# Patient Record
Sex: Male | Born: 1960
Health system: Southern US, Community
[De-identification: ages and names within clinical notes are randomized; demographics above are authoritative.]

## PROBLEM LIST (undated history)

## (undated) DIAGNOSIS — I10 Essential (primary) hypertension: Secondary | ICD-10-CM

## (undated) DIAGNOSIS — N200 Calculus of kidney: Secondary | ICD-10-CM

## (undated) DIAGNOSIS — N289 Disorder of kidney and ureter, unspecified: Secondary | ICD-10-CM

## (undated) DIAGNOSIS — E119 Type 2 diabetes mellitus without complications: Secondary | ICD-10-CM

## (undated) DIAGNOSIS — J449 Chronic obstructive pulmonary disease, unspecified: Secondary | ICD-10-CM

## (undated) DIAGNOSIS — K594 Anal spasm: Secondary | ICD-10-CM

## (undated) DIAGNOSIS — E785 Hyperlipidemia, unspecified: Secondary | ICD-10-CM

## (undated) HISTORY — DX: Essential (primary) hypertension: I10

## (undated) HISTORY — DX: Hyperlipidemia, unspecified: E78.5

## (undated) HISTORY — DX: Anal spasm: K59.4

## (undated) HISTORY — DX: Calculus of kidney: N20.0

## (undated) HISTORY — PX: SPINE SURGERY: SHX786

---

## 2000-12-17 ENCOUNTER — Encounter: Payer: Self-pay | Admitting: Emergency Medicine

## 2000-12-17 ENCOUNTER — Emergency Department (HOSPITAL_COMMUNITY): Admission: EM | Admit: 2000-12-17 | Discharge: 2000-12-17 | Payer: Self-pay | Admitting: Emergency Medicine

## 2010-05-14 ENCOUNTER — Ambulatory Visit (HOSPITAL_COMMUNITY): Admission: RE | Admit: 2010-05-14 | Discharge: 2010-05-14 | Payer: Self-pay | Admitting: Neurosurgery

## 2010-10-06 LAB — BASIC METABOLIC PANEL
BUN: 12 mg/dL (ref 6–23)
CO2: 28 mEq/L (ref 19–32)
Calcium: 9.6 mg/dL (ref 8.4–10.5)
Chloride: 104 mEq/L (ref 96–112)
Creatinine, Ser: 1.01 mg/dL (ref 0.4–1.5)
GFR calc Af Amer: >60 mL/min (ref >60)
GFR calc non Af Amer: >60 mL/min (ref >60)
Glucose, Bld: 102 mg/dL — ABNORMAL HIGH (ref 70–99)
Potassium: 4.2 mEq/L (ref 3.5–5.1)
Sodium: 140 mEq/L (ref 135–145)

## 2010-10-06 LAB — CBC
HCT: 45 % (ref 39.0–52.0)
Hemoglobin: 15.3 g/dL (ref 13.0–17.0)
MCH: 30.3 pg (ref 26.0–34.0)
MCHC: 34 g/dL (ref 30.0–36.0)
MCV: 89.1 fL (ref 78.0–100.0)
Platelets: 164 10*3/uL (ref 150–400)
RBC: 5.05 MIL/uL (ref 4.22–5.81)
RDW: 13.3 % (ref 11.5–15.5)
WBC: 9.1 10*3/uL (ref 4.0–10.5)

## 2010-10-06 LAB — SURGICAL PCR SCREEN
MRSA, PCR: NEGATIVE
Staphylococcus aureus: NEGATIVE

## 2011-12-23 IMAGING — CR DG CHEST 2V
2 series · 2 of 2 positions shown · non-contrast
Comparison: None

CLINICAL DATA: Lumbar disc herniation.  Preop.  Hypertension.

CHEST - 2 VIEW

[view not recorded (1 of 2)]
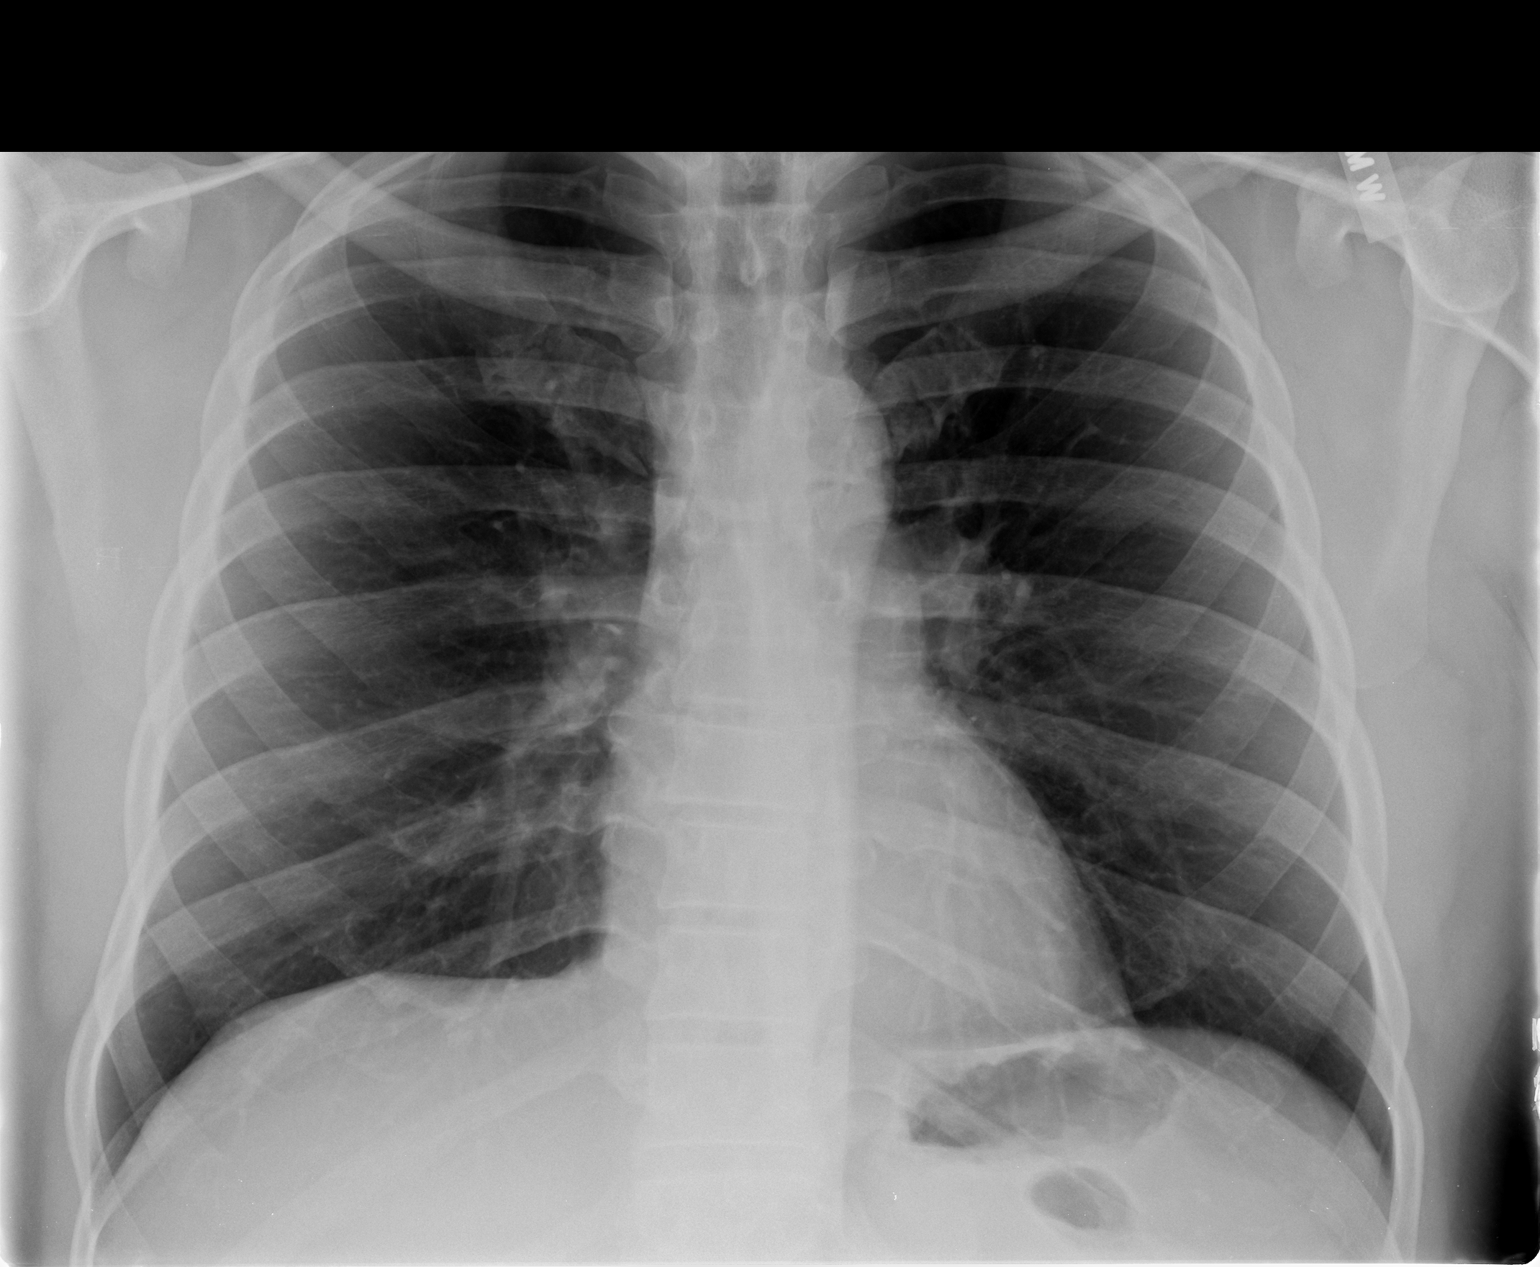

[view not recorded (2 of 2)]
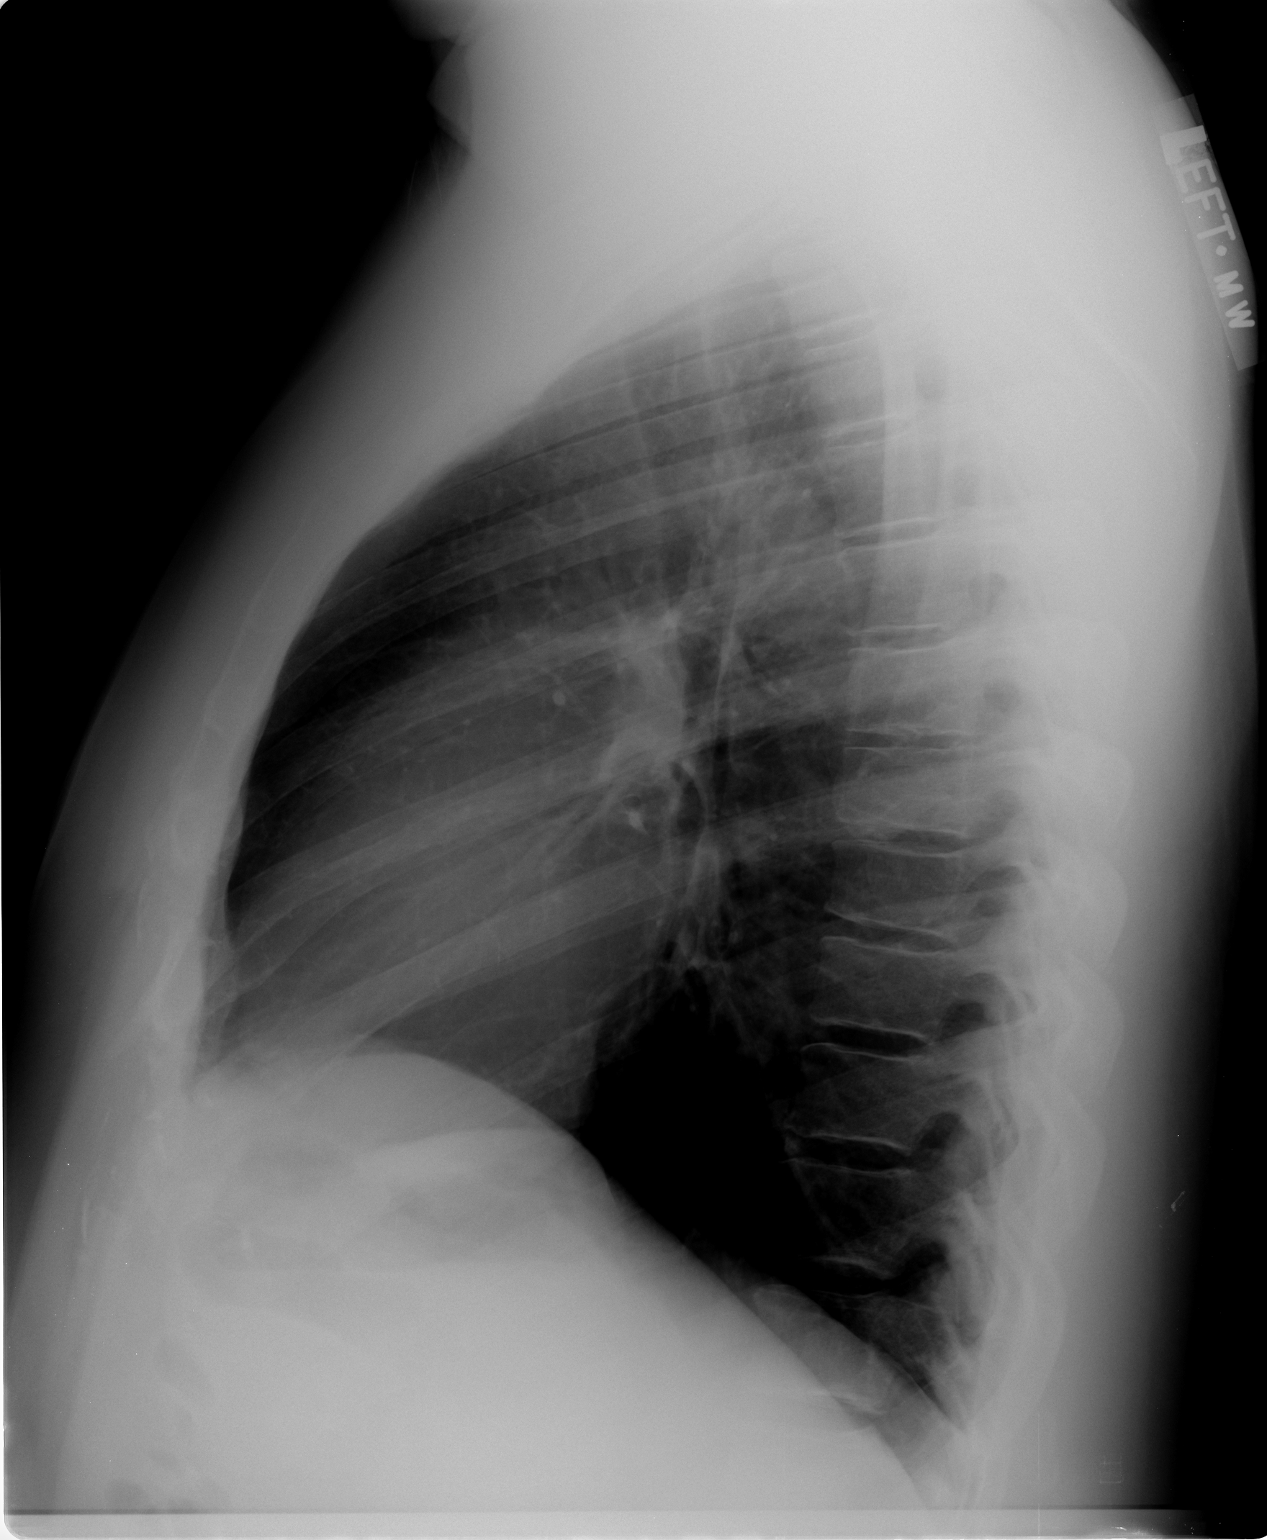

[2 of 2 positions shown; findings below may reference images not displayed]

FINDINGS: Heart and mediastinal contours are within normal limits.
No focal opacities or effusions.  No acute bony abnormality.
IMPRESSION: No active disease.

## 2012-11-19 ENCOUNTER — Telehealth: Payer: Self-pay | Admitting: Physician Assistant

## 2012-11-20 NOTE — Telephone Encounter (Signed)
APPT MADE

## 2012-12-24 ENCOUNTER — Ambulatory Visit: Payer: Self-pay | Admitting: Family Medicine

## 2013-07-26 ENCOUNTER — Telehealth: Payer: Self-pay | Admitting: Family Medicine

## 2013-07-26 ENCOUNTER — Other Ambulatory Visit: Payer: Self-pay | Admitting: *Deleted

## 2013-07-26 MED ORDER — BENAZEPRIL HCL 40 MG PO TABS
40.0000 mg | ORAL_TABLET | Freq: Every day | ORAL | Status: DC
Start: 2013-07-26 — End: 2013-08-19

## 2013-07-26 MED ORDER — TRIAMTERENE-HCTZ 37.5-25 MG PO TABS: 1.0000 | ORAL_TABLET | Freq: Every day | ORAL | Status: DC

## 2013-07-26 MED ORDER — PRAVASTATIN SODIUM 40 MG PO TABS: 40.0000 mg | ORAL_TABLET | Freq: Every day | ORAL | Status: DC

## 2013-07-26 MED ORDER — BENAZEPRIL HCL 40 MG PO TABS: 40.0000 mg | ORAL_TABLET | Freq: Every day | ORAL | Status: DC

## 2013-08-15 ENCOUNTER — Ambulatory Visit: Payer: Self-pay | Admitting: Family Medicine

## 2013-08-19 ENCOUNTER — Encounter: Payer: Self-pay | Admitting: Family Medicine

## 2013-08-19 ENCOUNTER — Ambulatory Visit (INDEPENDENT_AMBULATORY_CARE_PROVIDER_SITE_OTHER): Payer: 59 | Admitting: Family Medicine

## 2013-08-19 VITALS — BP 139/75 | HR 55 | Temp 98.8°F | Ht 75.0 in | Wt 264.4 lb

## 2013-08-19 DIAGNOSIS — I10 Essential (primary) hypertension: Secondary | ICD-10-CM | POA: Insufficient documentation

## 2013-08-19 DIAGNOSIS — E785 Hyperlipidemia, unspecified: Secondary | ICD-10-CM

## 2013-08-19 DIAGNOSIS — E782 Mixed hyperlipidemia: Secondary | ICD-10-CM | POA: Insufficient documentation

## 2013-08-19 DIAGNOSIS — Z1211 Encounter for screening for malignant neoplasm of colon: Secondary | ICD-10-CM

## 2013-08-19 DIAGNOSIS — Z125 Encounter for screening for malignant neoplasm of prostate: Secondary | ICD-10-CM

## 2013-08-19 LAB — POCT URINALYSIS DIPSTICK
Bilirubin, UA: NEGATIVE
Blood, UA: NEGATIVE
Glucose, UA: NEGATIVE
Ketones, UA: NEGATIVE
Leukocytes, UA: NEGATIVE
Nitrite, UA: NEGATIVE
Protein, UA: NEGATIVE
Spec Grav, UA: 1.02
Urobilinogen, UA: NEGATIVE
pH, UA: 5

## 2013-08-19 LAB — POCT UA - MICROSCOPIC ONLY
Bacteria, U Microscopic: NEGATIVE
Casts, Ur, LPF, POC: NEGATIVE
Crystals, Ur, HPF, POC: NEGATIVE
Epithelial cells, urine per micros: NEGATIVE
Mucus, UA: NEGATIVE
RBC, urine, microscopic: NEGATIVE
WBC, Ur, HPF, POC: NEGATIVE
Yeast, UA: NEGATIVE

## 2013-08-19 MED ORDER — BENAZEPRIL HCL 40 MG PO TABS: 40.0000 mg | ORAL_TABLET | Freq: Every day | ORAL | Status: DC

## 2013-08-19 MED ORDER — PRAVASTATIN SODIUM 40 MG PO TABS: 40.0000 mg | ORAL_TABLET | Freq: Every day | ORAL | Status: DC

## 2013-08-19 MED ORDER — TRIAMTERENE-HCTZ 37.5-25 MG PO TABS: 1.0000 | ORAL_TABLET | Freq: Every day | ORAL | Status: DC

## 2013-08-19 NOTE — Progress Notes (Signed)
Patient ID: Bruce Fisher, male   DOB: 01/26/1961, 53 y.o.   MRN: [id number] SUBJECTIVE: CC: Chief Complaint  Patient presents with  . Medication Refill    HPI: Patient is here for follow up of hyperlipidemia/HTN: denies Headache;denies Chest Pain;denies weakness;denies Shortness of Breath and orthopnea;denies Visual changes;denies palpitations;denies cough;denies pedal edema;denies symptoms of TIA or stroke;deniesClaudication symptoms. admits to Compliance with medications; denies Problems with medications.  Occupation: self employed. 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  On a diet.reducing sodas and calories.  Past Medical History  Diagnosis Date  . Hyperlipidemia   . Hypertension    No past surgical history on file. History   Social History  . Marital Status: Married    Spouse Name: N/A    Number of Children: N/A  . Years of Education: N/A   Occupational History  . Not on file.   Social History Main Topics  . Smoking status: Former Smoker    Types: Cigarettes    Quit date: 08/19/1993  . Smokeless tobacco: Not on file  . Alcohol Use: No  . Drug Use: No  . Sexual Activity: Yes   Other Topics Concern  . Not on file   Social History Narrative  . No narrative on file   Family History  Problem Relation Age of Onset  . Diabetes Mother   . Cancer Sister     breast   No current outpatient prescriptions on file prior to visit.   No current facility-administered medications on file prior to visit.   No Known Allergies  There is no immunization history on file for this patient. Prior to Admission medications   Medication Sig Start Date End Date Taking? Authorizing Provider  benazepril (LOTENSIN) 40 MG tablet Take 1 tablet (40 mg total) by mouth daily. 07/26/13  Yes Vernie Shanks, MD  pravastatin (PRAVACHOL) 40 MG tablet Take 1 tablet (40 mg total) by mouth daily. 07/26/13  Yes Vernie Shanks, MD  triamterene-hydrochlorothiazide (MAXZIDE-25) 37.5-25  MG per tablet Take 1 tablet by mouth daily. 07/26/13  Yes Vernie Shanks, MD     ROS: As above in the HPI. All other systems are stable or negative.  OBJECTIVE: APPEARANCE:  Patient in no acute distress.The patient appeared well nourished and normally developed. Acyanotic. Waist: VITAL SIGNS:BP 139/75  Pulse 55  Temp(Src) 98.8 F (37.1 C) (Oral)  Ht '6\' 3"'  (1.905 m)  Wt 264 lb 6.4 oz (119.931 kg)  BMI 33.05 kg/m2  AAM tall large built muscular with mild central obesity. SKIN: warm and  Dry without overt rashes, tattoos and scars  HEAD and Neck: without JVD, Head and scalp: normal Eyes:No scleral icterus. Fundi normal, eye movements normal. Ears: Auricle normal, canal normal, Tympanic membranes normal, insufflation normal. Nose: normal Throat: normal Neck & thyroid: normal  CHEST & LUNGS: Chest wall: normal Lungs: Clear  CVS: Reveals the PMI to be normally located. Regular rhythm, First and Second Heart sounds are normal,  absence of murmurs, rubs or gallops. Peripheral vasculature: Radial pulses: normal Dorsal pedis pulses: normal Posterior pulses: normal  ABDOMEN:  Appearance: normal Benign, no organomegaly, no masses, no Abdominal Aortic enlargement. No Guarding , no rebound. No Bruits. Bowel sounds: normal  RECTAL: N/A GU: N/A  EXTREMETIES: nonedematous.  MUSCULOSKELETAL:  Spine: normal Joints: intact  NEUROLOGIC: oriented to time,place and person; nonfocal. Strength is normal Sensory is normal Reflexes are normal Cranial Nerves are normal.  ASSESSMENT: Hypertension - Plan: benazepril (LOTENSIN) 40 MG tablet, triamterene-hydrochlorothiazide (MAXZIDE-25) 37.5-25  MG per tablet, CMP14+EGFR, POCT UA - Microscopic Only, POCT urinalysis dipstick  Hyperlipidemia - Plan: pravastatin (PRAVACHOL) 40 MG tablet, NMR, lipoprofile  Screening for prostate cancer - Plan: PSA, total and free  Screen for colon cancer - Plan: Ambulatory referral to  Gastroenterology  PLAN:      Dr Paula Libra Recommendations  For nutrition information, I recommend books:  1).Eat to Live by Dr Excell Seltzer. 2).Prevent and Reverse Heart Disease by Dr Karl Luke. 3) Dr Janene Harvey Book:  Program to Reverse Diabetes 4) The [place]  Study by T. Heath Gold.  Exercise recommendations are:  If unable to walk, then the patient can exercise in a chair 3 times a day. By flapping arms like a bird gently and raising legs outwards to the front.  If ambulatory, the patient can go for walks for 30 minutes 3 times a week. Then increase the intensity and duration as tolerated.  Goal is to try to attain exercise frequency to 5 times a week.  If applicable: Best to perform resistance exercises (machines or weights) 2 days a week and cardio type exercises 3 days per week.   DASH diet  Orders Placed This Encounter  Procedures  . CMP14+EGFR  . NMR, lipoprofile  . PSA, total and free  . Ambulatory referral to Gastroenterology    Referral Priority:  Routine    Referral Type:  Consultation    Referral Reason:  Specialty Services Required    Requested Specialty:  Gastroenterology    Number of Visits Requested:  1  . POCT UA - Microscopic Only  . POCT urinalysis dipstick   Meds ordered this encounter  Medications  . aspirin EC 81 MG tablet    Sig: Take 81 mg by mouth daily.  . benazepril (LOTENSIN) 40 MG tablet    Sig: Take 1 tablet (40 mg total) by mouth daily.    Dispense:  30 tablet    Refill:  5  . triamterene-hydrochlorothiazide (MAXZIDE-25) 37.5-25 MG per tablet    Sig: Take 1 tablet by mouth daily.    Dispense:  30 tablet    Refill:  5  . pravastatin (PRAVACHOL) 40 MG tablet    Sig: Take 1 tablet (40 mg total) by mouth daily.    Dispense:  30 tablet    Refill:  5   Medications Discontinued During This Encounter  Medication Reason  . benazepril (LOTENSIN) 40 MG tablet Reorder  . triamterene-hydrochlorothiazide (MAXZIDE-25)  37.5-25 MG per tablet Reorder  . pravastatin (PRAVACHOL) 40 MG tablet Reorder   Return in about 3 months (around 11/17/2013) for Recheck medical problems.  Meira Wahba P. Jacelyn Grip, M.D.

## 2013-08-19 NOTE — Patient Instructions (Addendum)
Dr Paula Libra Recommendations  For nutrition information, I recommend books:  1).Eat to Live by Dr Excell Seltzer. 2).Prevent and Reverse Heart Disease by Dr Karl Luke. 3) Dr Janene Harvey Book:  Program to Reverse Diabetes 4) The [place] Study by T. Heath Gold.  Exercise recommendations are:  If unable to walk, then the patient can exercise in a chair 3 times a day. By flapping arms like a bird gently and raising legs outwards to the front.  If ambulatory, the patient can go for walks for 30 minutes 3 times a week. Then increase the intensity and duration as tolerated.  Goal is to try to attain exercise frequency to 5 times a week.  If applicable: Best to perform resistance exercises (machines or weights) 2 days a week and cardio type exercises 3 days per week.   DASH Diet The DASH diet stands for "Dietary Approaches to Stop Hypertension." It is a healthy eating plan that has been shown to reduce high blood pressure (hypertension) in as little as 14 days, while also possibly providing other significant health benefits. These other health benefits include reducing the risk of breast cancer after menopause and reducing the risk of type 2 diabetes, heart disease, colon cancer, and stroke. Health benefits also include weight loss and slowing kidney failure in patients with chronic kidney disease.  DIET GUIDELINES  Limit salt (sodium). Your diet should contain less than 1500 mg of sodium daily.  Limit refined or processed carbohydrates. Your diet should include mostly whole grains. Desserts and added sugars should be used sparingly.  Include small amounts of heart-healthy fats. These types of fats include nuts, oils, and tub margarine. Limit saturated and trans fats. These fats have been shown to be harmful in the body. CHOOSING FOODS  The following food groups are based on a 2000 calorie diet. See your Registered Dietitian for individual calorie needs. Grains and Grain  Products (6 to 8 servings daily)  Eat More Often: Whole-wheat bread, brown rice, whole-grain or wheat pasta, quinoa, popcorn without added fat or salt (air popped).  Eat Less Often: White bread, white pasta, white rice, cornbread. Vegetables (4 to 5 servings daily)  Eat More Often: Fresh, frozen, and canned vegetables. Vegetables may be raw, steamed, roasted, or grilled with a minimal amount of fat.  Eat Less Often/Avoid: Creamed or fried vegetables. Vegetables in a cheese sauce. Fruit (4 to 5 servings daily)  Eat More Often: All fresh, canned (in natural juice), or frozen fruits. Dried fruits without added sugar. One hundred percent fruit juice ( cup [237 mL] daily).  Eat Less Often: Dried fruits with added sugar. Canned fruit in light or heavy syrup. [employer], Fish, and Poultry (2 servings or less daily. One serving is 3 to 4 oz [85-114 g]).  Eat More Often: Ninety percent or leaner ground beef, tenderloin, sirloin. Round cuts of beef, chicken breast, [place] breast. All fish. Grill, bake, or broil your meat. Nothing should be fried.  Eat Less Often/Avoid: Fatty cuts of meat, [place], or chicken leg, thigh, or wing. Fried cuts of meat or fish. Dairy (2 to 3 servings)  Eat More Often: Low-fat or fat-free milk, low-fat plain or light yogurt, reduced-fat or part-skim cheese.  Eat Less Often/Avoid: Milk (whole, 2%).Whole milk yogurt. Full-fat cheeses. Nuts, Seeds, and Legumes (4 to 5 servings per week)  Eat More Often: All without added salt.  Eat Less Often/Avoid: Salted nuts and seeds, canned beans with added salt. Fats and Sweets (  limited)  Eat More Often: Vegetable oils, tub margarines without trans fats, sugar-free gelatin. Mayonnaise and salad dressings.  Eat Less Often/Avoid: Coconut oils, palm oils, butter, stick margarine, cream, half and half, cookies, candy, pie. FOR MORE INFORMATION The Dash Diet Eating Plan: www.dashdiet.org Document Released: 06/30/2011 Document  Revised: 10/03/2011 Document Reviewed: 06/30/2011 [facility] Patient Information 2014 [place], [place].

## 2013-08-20 LAB — CMP14+EGFR
ALT: 25 IU/L (ref 0–44)
AST: 25 IU/L (ref 0–40)
Albumin/Globulin Ratio: 1.5 (ref 1.1–2.5)
Albumin: 4.6 g/dL (ref 3.5–5.5)
Alkaline Phosphatase: 55 IU/L (ref 39–117)
BUN/Creatinine Ratio: 16 (ref 9–20)
BUN: 16 mg/dL (ref 6–24)
CO2: 23 mmol/L (ref 18–29)
Calcium: 9.8 mg/dL (ref 8.7–10.2)
Chloride: 103 mmol/L (ref 97–108)
Creatinine, Ser: 1.03 mg/dL (ref 0.76–1.27)
GFR calc Af Amer: 97 mL/min/{1.73_m2} (ref >59)
GFR calc non Af Amer: 84 mL/min/{1.73_m2} (ref >59)
Globulin, Total: 3 g/dL (ref 1.5–4.5)
Glucose: 92 mg/dL (ref 65–99)
Potassium: 3.9 mmol/L (ref 3.5–5.2)
Sodium: 143 mmol/L (ref 134–144)
Total Bilirubin: <0.2 mg/dL (ref 0.0–1.2)
Total Protein: 7.6 g/dL (ref 6.0–8.5)

## 2013-08-20 LAB — NMR, LIPOPROFILE
Cholesterol: 177 mg/dL (ref <200)
HDL Cholesterol by NMR: 42 mg/dL (ref >=40)
HDL Particle Number: 33.4 umol/L (ref >=30.5)
LDL Particle Number: 1454 nmol/L — ABNORMAL HIGH (ref <1000)
LDL Size: 20.2 nm — ABNORMAL LOW (ref >20.5)
LDLC SERPL CALC-MCNC: 93 mg/dL (ref <100)
LP-IR Score: 65 — ABNORMAL HIGH (ref <=45)
Small LDL Particle Number: 895 nmol/L — ABNORMAL HIGH (ref <=527)
Triglycerides by NMR: 208 mg/dL — ABNORMAL HIGH (ref <150)

## 2013-08-20 LAB — PSA, TOTAL AND FREE
PSA, Free Pct: 40.7 %
PSA, Free: 0.57 ng/mL
PSA: 1.4 ng/mL (ref 0.0–4.0)

## 2013-08-25 NOTE — Progress Notes (Signed)
Quick Note:  Call Patient Labs that are abnormal: The triglycerides and the LDL particles are a little high  The rest are at goal  Recommendations: Change diet as we discussed. This should control the lipids without needing more medications. No change in follow up.  ______

## 2013-08-29 ENCOUNTER — Encounter: Payer: Self-pay | Admitting: Gastroenterology

## 2013-10-29 ENCOUNTER — Encounter: Payer: Self-pay | Admitting: Gastroenterology

## 2013-11-19 ENCOUNTER — Encounter (INDEPENDENT_AMBULATORY_CARE_PROVIDER_SITE_OTHER): Payer: 59 | Admitting: Family Medicine

## 2013-11-19 NOTE — Progress Notes (Signed)
Patient ID: Bruce Fisher, male   DOB: Apr 08, 1961, 53 y.o.   MRN: [id number] Not seen

## 2013-12-02 ENCOUNTER — Telehealth: Payer: Self-pay | Admitting: Family Medicine

## 2013-12-02 DIAGNOSIS — E785 Hyperlipidemia, unspecified: Secondary | ICD-10-CM

## 2013-12-02 DIAGNOSIS — I10 Essential (primary) hypertension: Secondary | ICD-10-CM

## 2013-12-03 MED ORDER — PRAVASTATIN SODIUM 40 MG PO TABS: 40.0000 mg | ORAL_TABLET | Freq: Every day | ORAL | Status: DC

## 2013-12-03 MED ORDER — TRIAMTERENE-HCTZ 37.5-25 MG PO TABS: 1.0000 | ORAL_TABLET | Freq: Every day | ORAL | Status: DC

## 2013-12-03 MED ORDER — BENAZEPRIL HCL 40 MG PO TABS: 40.0000 mg | ORAL_TABLET | Freq: Every day | ORAL | Status: DC

## 2013-12-03 NOTE — Telephone Encounter (Signed)
done

## 2013-12-11 ENCOUNTER — Encounter: Payer: Self-pay | Admitting: *Deleted

## 2013-12-17 ENCOUNTER — Ambulatory Visit: Payer: 59 | Admitting: Physician Assistant

## 2013-12-18 ENCOUNTER — Ambulatory Visit: Payer: 59 | Admitting: Physician Assistant

## 2013-12-26 ENCOUNTER — Telehealth: Payer: Self-pay | Admitting: Family Medicine

## 2013-12-26 NOTE — Telephone Encounter (Signed)
appt given for tues with wlw

## 2013-12-31 ENCOUNTER — Encounter: Payer: Self-pay | Admitting: Physician Assistant

## 2013-12-31 ENCOUNTER — Ambulatory Visit (INDEPENDENT_AMBULATORY_CARE_PROVIDER_SITE_OTHER): Payer: 59 | Admitting: Physician Assistant

## 2013-12-31 VITALS — BP 129/77 | HR 62 | Temp 98.4°F | Ht 75.0 in | Wt 260.0 lb

## 2013-12-31 DIAGNOSIS — I10 Essential (primary) hypertension: Secondary | ICD-10-CM

## 2013-12-31 DIAGNOSIS — E785 Hyperlipidemia, unspecified: Secondary | ICD-10-CM

## 2013-12-31 MED ORDER — PRAVASTATIN SODIUM 40 MG PO TABS: 40.0000 mg | ORAL_TABLET | Freq: Every day | ORAL | Status: DC

## 2013-12-31 MED ORDER — TRIAMTERENE-HCTZ 37.5-25 MG PO TABS: 1.0000 | ORAL_TABLET | Freq: Every day | ORAL | Status: DC

## 2013-12-31 MED ORDER — BENAZEPRIL HCL 40 MG PO TABS: 40.0000 mg | ORAL_TABLET | Freq: Every day | ORAL | Status: DC

## 2013-12-31 NOTE — Addendum Note (Signed)
Addended by: Lodema Pilot on: 12/31/2013 06:26 PM   Modules accepted: Orders

## 2013-12-31 NOTE — Progress Notes (Signed)
Subjective:     Patient ID: Bruce Fisher, male   DOB: 01/21/1961, 53 y.o.   MRN: [id number]  HPI Pt here for recheck regarding HTN and hyperlipid States he had labs 3 months ago   Review of Systems Denies CP, SOB, or lower ext edema No headaches He has been on and taking med on a regular basis    Objective:   Physical Exam Vital reviewed Oral- no lesions No JAVID/bruits Heart- RRR w/o M Lungs- CTA No lower ext edema    Assessment:     HTN Hyperlipid    Plan:     Since pt has been stab;le hold further labs today RF of all meds x 6 months F/U in 6 months with labs Recommended healthy eating and exercise

## 2013-12-31 NOTE — Patient Instructions (Signed)

## 2013-12-31 NOTE — Addendum Note (Signed)
Addended by: Lodema Pilot on: 12/31/2013 05:33 PM   Modules accepted: Orders

## 2014-01-14 ENCOUNTER — Ambulatory Visit: Payer: 59 | Admitting: Physician Assistant

## 2014-05-06 ENCOUNTER — Encounter: Payer: Self-pay | Admitting: Gastroenterology

## 2014-06-24 ENCOUNTER — Ambulatory Visit (AMBULATORY_SURGERY_CENTER): Payer: Self-pay

## 2014-06-24 VITALS — Ht 74.0 in | Wt 269.8 lb

## 2014-06-24 DIAGNOSIS — Z1211 Encounter for screening for malignant neoplasm of colon: Secondary | ICD-10-CM

## 2014-06-24 MED ORDER — MOVIPREP 100 G PO SOLR: ORAL | Status: DC

## 2014-06-24 NOTE — Progress Notes (Signed)
Per pt, no allergies to soy or egg products.Pt not taking any weight loss meds or using  O2 at home. 

## 2014-07-07 ENCOUNTER — Ambulatory Visit: Payer: 59 | Admitting: Family Medicine

## 2014-07-10 ENCOUNTER — Encounter: Payer: Self-pay | Admitting: Gastroenterology

## 2014-07-10 ENCOUNTER — Ambulatory Visit (AMBULATORY_SURGERY_CENTER): Payer: 59 | Admitting: Gastroenterology

## 2014-07-10 VITALS — BP 145/60 | HR 65 | Temp 97.5°F | Resp 23 | Ht 74.0 in | Wt 269.0 lb

## 2014-07-10 DIAGNOSIS — D125 Benign neoplasm of sigmoid colon: Secondary | ICD-10-CM

## 2014-07-10 DIAGNOSIS — Z1211 Encounter for screening for malignant neoplasm of colon: Secondary | ICD-10-CM

## 2014-07-10 DIAGNOSIS — K635 Polyp of colon: Secondary | ICD-10-CM

## 2014-07-10 DIAGNOSIS — D124 Benign neoplasm of descending colon: Secondary | ICD-10-CM

## 2014-07-10 MED ORDER — SODIUM CHLORIDE 0.9 % IV SOLN: 500.0000 mL | INTRAVENOUS | Status: DC

## 2014-07-10 NOTE — Progress Notes (Signed)
Called to room to assist during endoscopic procedure.  Patient ID and intended procedure confirmed with present staff. Received instructions for my participation in the procedure from the performing physician.  

## 2014-07-10 NOTE — Patient Instructions (Signed)
Findings:  Polyps, Diverticulosis Recommendations:  High Fiber Diet with liberal fluid intake. Repeat colonoscopy in 5-10 years depending on pathology  YOU HAD AN ENDOSCOPIC PROCEDURE TODAY AT [facility]: Refer to the procedure report that was given to you for any specific questions about what was found during the examination.  If the procedure report does not answer your questions, please call your gastroenterologist to clarify.  If you requested that your care partner not be given the details of your procedure findings, then the procedure report has been included in a sealed envelope for you to review at your convenience later.  YOU SHOULD EXPECT: Some feelings of bloating in the abdomen. Passage of more gas than usual.  Walking can help get rid of the air that was put into your GI tract during the procedure and reduce the bloating. If you had a lower endoscopy (such as a colonoscopy or flexible sigmoidoscopy) you may notice spotting of blood in your stool or on the toilet paper. If you underwent a bowel prep for your procedure, then you may not have a normal bowel movement for a few days.  DIET: Your first meal following the procedure should be a light meal and then it is ok to progress to your normal diet.  A half-sandwich or bowl of soup is an example of a good first meal.  Heavy or fried foods are harder to digest and may make you feel nauseous or bloated.  Likewise meals heavy in dairy and vegetables can cause extra gas to form and this can also increase the bloating.  Drink plenty of fluids but you should avoid alcoholic beverages for 24 hours.  ACTIVITY: Your care partner should take you home directly after the procedure.  You should plan to take it easy, moving slowly for the rest of the day.  You can resume normal activity the day after the procedure however you should NOT DRIVE or use heavy machinery for 24 hours (because of the sedation medicines used during the test).     SYMPTOMS TO REPORT IMMEDIATELY: A gastroenterologist can be reached at any hour.  During normal business hours, 8:30 AM to 5:00 PM Monday through Friday, call [phone].  After hours and on weekends, please call the GI answering service at [phone] who will take a message and have the physician on call contact you.   Following lower endoscopy (colonoscopy or flexible sigmoidoscopy):  Excessive amounts of blood in the stool  Significant tenderness or worsening of abdominal pains  Swelling of the abdomen that is new, acute  Fever of 100F or higher  Following upper endoscopy (EGD)  Vomiting of blood or coffee ground material  New chest pain or pain under the shoulder blades  Painful or persistently difficult swallowing  New shortness of breath  Fever of 100F or higher  Black, tarry-looking stools  FOLLOW UP: If any biopsies were taken you will be contacted by phone or by letter within the next 1-3 weeks.  Call your gastroenterologist if you have not heard about the biopsies in 3 weeks.  Our staff will call the home number listed on your records the next business day following your procedure to check on you and address any questions or concerns that you may have at that time regarding the information given to you following your procedure. This is a courtesy call and so if there is no answer at the home number and we have not heard from you through the emergency physician  on call, we will assume that you have returned to your regular daily activities without incident.  SIGNATURES/CONFIDENTIALITY: You and/or your care partner have signed paperwork which will be entered into your electronic medical record.  These signatures attest to the fact that that the information above on your After Visit Summary has been reviewed and is understood.  Full responsibility of the confidentiality of this discharge information lies with you and/or your care-partner.

## 2014-07-10 NOTE — Op Note (Signed)
[place]  [address] [place], [id number]   COLONOSCOPY PROCEDURE REPORT  PATIENT: Bruce Fisher, Bruce Fisher  MR#: [id number] BIRTHDATE: 1961-03-30 , 74  yrs. old GENDER: male ENDOSCOPIST: Ladene Artist, MD, [facility] REFERRED BM:[id number] Jacelyn Grip, M.D. PROCEDURE DATE:  07/10/2014 PROCEDURE:   Colonoscopy with biopsy and Colonoscopy with snare polypectomy First Screening Colonoscopy - Avg.  risk and is 50 yrs.  old or older Yes.  Prior Negative Screening - Now for repeat screening. N/A  History of Adenoma - Now for follow-up colonoscopy & has been > or = to 3 yrs.  N/A  Polyps Removed Today? Yes. ASA CLASS:   Class II INDICATIONS:average risk for colorectal cancer. MEDICATIONS: Monitored anesthesia care and Propofol 300 mg IV DESCRIPTION OF PROCEDURE:   After the risks benefits and alternatives of the procedure were thoroughly explained, informed consent was obtained.  The digital rectal exam revealed no abnormalities of the rectum.   The LB [id number]  endoscope was introduced through the anus and advanced to the cecum, which was identified by both the appendix and ileocecal valve. No adverse events experienced.   The quality of the prep was good, using MoviPrep  The instrument was then slowly withdrawn as the colon was fully examined.  COLON FINDINGS: A sessile polyp measuring 6 mm in size was found in the descending colon.  A polypectomy was performed with a cold snare.  The resection was complete, the polyp tissue was completely retrieved and sent to histology.   A sessile polyp measuring 4 mm in size was found in the sigmoid colon.  A polypectomy was performed with cold forceps.  The resection was complete, the polyp tissue was completely retrieved and sent to histology.   There was diverticulosis noted in the sigmoid colon.   The examination was otherwise normal.  Retroflexed views revealed no abnormalities. The time to cecum=1 minutes 48 seconds.  Withdrawal  time=12 minutes 52 seconds.  The scope was withdrawn and the procedure completed. COMPLICATIONS: There were no immediate complications.  ENDOSCOPIC IMPRESSION: 1.   Sessile polyp in the descending colon; polypectomy performed with a cold snare 2.   Sessile polyp in the sigmoid colon; polypectomy performed with cold forceps 3.   Diverticulosis in the sigmoid colon  RECOMMENDATIONS: 1.  Await pathology results 2.  Repeat colonoscopy in 5 years if polyp(s) adenomatous; otherwise 10 years 3.  High fiber diet with liberal fluid intake.  eSigned:  Ladene Artist, MD, [facility] 07/10/2014 11:54 AM

## 2014-07-10 NOTE — Progress Notes (Signed)
A/ox3 pleased with MAC, report to Tracy RN 

## 2014-07-11 ENCOUNTER — Telehealth: Payer: Self-pay | Admitting: *Deleted

## 2014-07-11 NOTE — Telephone Encounter (Signed)
  Follow up Call-  Call back number 07/10/2014  Post procedure Call Back phone  # [phone]  Permission to leave phone message Yes     Patient questions:  Do you have a fever, pain , or abdominal swelling? No. Pain Score  0 *  Have you tolerated food without any problems? Yes.    Have you been able to return to your normal activities? Yes.    Do you have any questions about your discharge instructions: Diet   No. Medications  No. Follow up visit  No.  Do you have questions or concerns about your Care? No.  Actions: * If pain score is 4 or above: No action needed, pain <4.

## 2014-07-21 ENCOUNTER — Encounter: Payer: Self-pay | Admitting: Gastroenterology

## 2014-08-13 ENCOUNTER — Telehealth: Payer: Self-pay | Admitting: Family Medicine

## 2014-08-13 DIAGNOSIS — E785 Hyperlipidemia, unspecified: Secondary | ICD-10-CM

## 2014-08-13 MED ORDER — TRIAMTERENE-HCTZ 37.5-25 MG PO TABS: 1.0000 | ORAL_TABLET | Freq: Every day | ORAL | Status: DC

## 2014-08-13 MED ORDER — PRAVASTATIN SODIUM 40 MG PO TABS: 40.0000 mg | ORAL_TABLET | Freq: Every day | ORAL | Status: DC

## 2014-08-13 MED ORDER — BENAZEPRIL HCL 40 MG PO TABS: 40.0000 mg | ORAL_TABLET | Freq: Every day | ORAL | Status: DC

## 2014-08-13 NOTE — Telephone Encounter (Signed)
done

## 2014-09-01 ENCOUNTER — Encounter: Payer: Self-pay | Admitting: Family Medicine

## 2014-09-01 ENCOUNTER — Ambulatory Visit (INDEPENDENT_AMBULATORY_CARE_PROVIDER_SITE_OTHER): Payer: 59 | Admitting: Family Medicine

## 2014-09-01 VITALS — BP 143/76 | HR 76 | Temp 99.0°F | Ht 75.0 in | Wt 271.0 lb

## 2014-09-01 DIAGNOSIS — I10 Essential (primary) hypertension: Secondary | ICD-10-CM

## 2014-09-01 DIAGNOSIS — R5383 Other fatigue: Secondary | ICD-10-CM

## 2014-09-01 DIAGNOSIS — Z139 Encounter for screening, unspecified: Secondary | ICD-10-CM

## 2014-09-01 DIAGNOSIS — E785 Hyperlipidemia, unspecified: Secondary | ICD-10-CM

## 2014-09-01 MED ORDER — BENAZEPRIL HCL 40 MG PO TABS: 40.0000 mg | ORAL_TABLET | Freq: Every day | ORAL | Status: DC

## 2014-09-01 MED ORDER — TRIAMTERENE-HCTZ 37.5-25 MG PO TABS: 1.0000 | ORAL_TABLET | Freq: Every day | ORAL | Status: DC

## 2014-09-01 MED ORDER — PRAVASTATIN SODIUM 40 MG PO TABS: 40.0000 mg | ORAL_TABLET | Freq: Every day | ORAL | Status: DC

## 2014-09-01 NOTE — Progress Notes (Signed)
Subjective:  Patient ID: Bruce Fisher, male    DOB: 26-Nov-1960  Age: 54 y.o. MRN: [id number]  CC: Hypertension and Hyperlipidemia   HPI Bruce Fisher presents for Patient in for follow-up of hypertension. Patient has no history of headache chest pain or shortness of breath or recent cough. Patient also denies symptoms of TIA such as numbness weakness lateralizing. Patient checks  blood pressure at home and has not had any elevated readings recently. Patient denies side effects from his medication. States taking it regularly.   Patient in for follow-up of elevated cholesterol. Doing well without complaints on current medication. Denies side effects of statin including myalgia and arthralgia and nausea. Also in today for liver function testing. Currently no chest pain, shortness of breath or other cardiovascular related symptoms noted. REquests stress test due to concern R.E. Multiple cardiac risk factors.  History Bruce Fisher has a past medical history of Hyperlipidemia; Hypertension; and Rectal spasm.   He has past surgical history that includes Spine surgery.   His family history includes Cancer in his sister; Diabetes in his mother.He reports that he quit smoking about 21 years ago. His smoking use included Cigarettes. He has never used smokeless tobacco. He reports that he does not drink alcohol or use illicit drugs.  Current Outpatient Prescriptions on File Prior to Visit  Medication Sig Dispense Refill  . aspirin EC 81 MG tablet Take 81 mg by mouth daily.     No current facility-administered medications on file prior to visit.    ROS Review of Systems  Constitutional: Negative for fever, chills, diaphoresis and unexpected weight change.  HENT: Negative for congestion, hearing loss, rhinorrhea, sore throat and trouble swallowing.   Respiratory: Negative for cough, chest tightness, shortness of breath and wheezing.   Gastrointestinal: Negative for nausea, vomiting, abdominal pain,  diarrhea, constipation and abdominal distention.  Endocrine: Negative for cold intolerance and heat intolerance.  Genitourinary: Negative for dysuria, hematuria and flank pain.  Musculoskeletal: Negative for joint swelling and arthralgias.  Skin: Negative for rash.  Neurological: Negative for dizziness and headaches.  Psychiatric/Behavioral: Negative for dysphoric mood, decreased concentration and agitation. The patient is not nervous/anxious.     Objective:  BP 143/76 mmHg  Pulse 76  Temp(Src) 99 F (37.2 C) (Oral)  Ht '6\' 3"'  (1.905 m)  Wt 271 lb (122.925 kg)  BMI 33.87 kg/m2  Physical Exam  Constitutional: He is oriented to person, place, and time. He appears well-developed and well-nourished. No distress.  HENT:  Head: Normocephalic and atraumatic.  Right Ear: External ear normal.  Left Ear: External ear normal.  Nose: Nose normal.  Mouth/Throat: Oropharynx is clear and moist.  Eyes: Conjunctivae and EOM are normal. Pupils are equal, round, and reactive to light.  Neck: Normal range of motion. Neck supple. No thyromegaly present.  Cardiovascular: Normal rate, regular rhythm and normal heart sounds.   No murmur heard. Pulmonary/Chest: Effort normal and breath sounds normal. No respiratory distress. He has no wheezes. He has no rales.  Abdominal: Soft. Bowel sounds are normal. He exhibits no distension. There is no tenderness.  Lymphadenopathy:    He has no cervical adenopathy.  Neurological: He is alert and oriented to person, place, and time. He has normal reflexes.  Skin: Skin is warm and dry.  Psychiatric: He has a normal mood and affect. His behavior is normal. Judgment and thought content normal.    Assessment & Plan:   Bruce Fisher was seen today for hypertension and hyperlipidemia.  Diagnoses and associated  orders for this visit:  Essential hypertension, benign - CMP14+EGFR - Echocardiogram stress test with contrast; Future  Other fatigue - POCT CBC - Thyroid Panel  With TSH - Vit D  25 hydroxy (rtn osteoporosis monitoring) - Echocardiogram stress test with contrast; Future  Hyperlipemia - CMP14+EGFR - NMR, lipoprofile - Echocardiogram stress test with contrast; Future  Screening - PSA, total and free  Hyperlipidemia - pravastatin (PRAVACHOL) 40 MG tablet; Take 1 tablet (40 mg total) by mouth daily.  Other Orders - benazepril (LOTENSIN) 40 MG tablet; Take 1 tablet (40 mg total) by mouth daily. - triamterene-hydrochlorothiazide (MAXZIDE-25) 37.5-25 MG per tablet; Take 1 tablet by mouth daily.    I have changed Bruce Fisher's benazepril, pravastatin, and triamterene-hydrochlorothiazide. I am also having him maintain his aspirin EC.  Meds ordered this encounter  Medications  . benazepril (LOTENSIN) 40 MG tablet    Sig: Take 1 tablet (40 mg total) by mouth daily.    Dispense:  90 tablet    Refill:  4  . pravastatin (PRAVACHOL) 40 MG tablet    Sig: Take 1 tablet (40 mg total) by mouth daily.    Dispense:  90 tablet    Refill:  4  . triamterene-hydrochlorothiazide (MAXZIDE-25) 37.5-25 MG per tablet    Sig: Take 1 tablet by mouth daily.    Dispense:  90 tablet    Refill:  4    Follow-up: Return in about 6 months (around 03/02/2015) for hypertension.  Bruce Fisher, M.D.

## 2014-09-01 NOTE — Patient Instructions (Signed)
DASH Eating Plan °DASH stands for "Dietary Approaches to Stop Hypertension." The DASH eating plan is a healthy eating plan that has been shown to reduce high blood pressure (hypertension). Additional health benefits may include reducing the risk of type 2 diabetes mellitus, heart disease, and stroke. The DASH eating plan may also help with weight loss. °WHAT DO I NEED TO KNOW ABOUT THE DASH EATING PLAN? °For the DASH eating plan, you will follow these general guidelines: °· Choose foods with a percent daily value for sodium of less than 5% (as listed on the food label). °· Use salt-free seasonings or herbs instead of table salt or sea salt. °· Check with your health care provider or pharmacist before using salt substitutes. °· Eat lower-sodium products, often labeled as "lower sodium" or "no salt added." °· Eat fresh foods. °· Eat more vegetables, fruits, and low-fat dairy products. °· Choose whole grains. Look for the word "whole" as the first word in the ingredient list. °· Choose fish and skinless chicken or turkey more often than red meat. Limit fish, poultry, and meat to 6 oz (170 g) each day. °· Limit sweets, desserts, sugars, and sugary drinks. °· Choose heart-healthy fats. °· Limit cheese to 1 oz (28 g) per day. °· Eat more home-cooked food and less restaurant, buffet, and fast food. °· Limit fried foods. °· Cook foods using methods other than frying. °· Limit canned vegetables. If you do use them, rinse them well to decrease the sodium. °· When eating at a restaurant, ask that your food be prepared with less salt, or no salt if possible. °WHAT FOODS CAN I EAT? °Seek help from a dietitian for individual calorie needs. °Grains °Whole grain or whole wheat bread. Brown rice. Whole grain or whole wheat pasta. Quinoa, bulgur, and whole grain cereals. Low-sodium cereals. Corn or whole wheat flour tortillas. Whole grain cornbread. Whole grain crackers. Low-sodium crackers. °Vegetables °Fresh or frozen vegetables  (raw, steamed, roasted, or grilled). Low-sodium or reduced-sodium tomato and vegetable juices. Low-sodium or reduced-sodium tomato sauce and paste. Low-sodium or reduced-sodium canned vegetables.  °Fruits °All fresh, canned (in natural juice), or frozen fruits. °Meat and Other Protein Products °Ground beef (85% or leaner), grass-fed beef, or beef trimmed of fat. Skinless chicken or turkey. Ground chicken or turkey. Pork trimmed of fat. All fish and seafood. Eggs. Dried beans, peas, or lentils. Unsalted nuts and seeds. Unsalted canned beans. °Dairy °Low-fat dairy products, such as skim or 1% milk, 2% or reduced-fat cheeses, low-fat ricotta or cottage cheese, or plain low-fat yogurt. Low-sodium or reduced-sodium cheeses. °Fats and Oils °Tub margarines without trans fats. Light or reduced-fat mayonnaise and salad dressings (reduced sodium). Avocado. Safflower, olive, or canola oils. Natural peanut or almond butter. °Other °Unsalted popcorn and pretzels. °The items listed above may not be a complete list of recommended foods or beverages. Contact your dietitian for more options. °WHAT FOODS ARE NOT RECOMMENDED? °Grains °White bread. White pasta. White rice. Refined cornbread. Bagels and croissants. Crackers that contain trans fat. °Vegetables °Creamed or fried vegetables. Vegetables in a cheese sauce. Regular canned vegetables. Regular canned tomato sauce and paste. Regular tomato and vegetable juices. °Fruits °Dried fruits. Canned fruit in light or heavy syrup. Fruit juice. °Meat and Other Protein Products °Fatty cuts of meat. Ribs, chicken wings, bacon, sausage, bologna, salami, chitterlings, fatback, hot dogs, bratwurst, and packaged luncheon meats. Salted nuts and seeds. Canned beans with salt. °Dairy °Whole or 2% milk, cream, half-and-half, and cream cheese. Whole-fat or sweetened yogurt. Full-fat   cheeses or blue cheese. Nondairy creamers and whipped toppings. Processed cheese, cheese spreads, or cheese  curds. °Condiments °Onion and garlic salt, seasoned salt, table salt, and sea salt. Canned and packaged gravies. Worcestershire sauce. Tartar sauce. Barbecue sauce. Teriyaki sauce. Soy sauce, including reduced sodium. Steak sauce. Fish sauce. Oyster sauce. Cocktail sauce. Horseradish. Ketchup and mustard. Meat flavorings and tenderizers. Bouillon cubes. Hot sauce. Tabasco sauce. Marinades. Taco seasonings. Relishes. °Fats and Oils °Butter, stick margarine, lard, shortening, ghee, and bacon fat. Coconut, palm kernel, or palm oils. Regular salad dressings. °Other °Pickles and olives. Salted popcorn and pretzels. °The items listed above may not be a complete list of foods and beverages to avoid. Contact your dietitian for more information. °WHERE CAN I FIND MORE INFORMATION? °National Heart, [employer], [employer] Institute: www.nhlbi.nih.gov/health/health-topics/topics/dash/ °Document Released: 06/30/2011 Document Revised: 11/25/2013 Document Reviewed: 05/15/2013 °Exit[employer]® Patient Information ©2015 [employer]. This information is not intended to replace advice given to you by your health care provider. Make sure you discuss any questions you have with your health care provider. ° °

## 2014-10-14 ENCOUNTER — Emergency Department (HOSPITAL_COMMUNITY)
Admission: EM | Admit: 2014-10-14 | Discharge: 2014-10-14 | Disposition: A | Discharge status: Home / Self Care | Payer: 59 | Attending: Emergency Medicine | Admitting: Emergency Medicine

## 2014-10-14 ENCOUNTER — Encounter (HOSPITAL_COMMUNITY): Payer: Self-pay | Admitting: *Deleted

## 2014-10-14 DIAGNOSIS — Z87891 Personal history of nicotine dependence: Secondary | ICD-10-CM | POA: Diagnosis not present

## 2014-10-14 DIAGNOSIS — E785 Hyperlipidemia, unspecified: Secondary | ICD-10-CM | POA: Diagnosis not present

## 2014-10-14 DIAGNOSIS — R197 Diarrhea, unspecified: Secondary | ICD-10-CM | POA: Diagnosis not present

## 2014-10-14 DIAGNOSIS — Z79899 Other long term (current) drug therapy: Secondary | ICD-10-CM | POA: Insufficient documentation

## 2014-10-14 DIAGNOSIS — Z8719 Personal history of other diseases of the digestive system: Secondary | ICD-10-CM | POA: Insufficient documentation

## 2014-10-14 DIAGNOSIS — Z7982 Long term (current) use of aspirin: Secondary | ICD-10-CM | POA: Insufficient documentation

## 2014-10-14 DIAGNOSIS — R112 Nausea with vomiting, unspecified: Secondary | ICD-10-CM | POA: Diagnosis not present

## 2014-10-14 DIAGNOSIS — R109 Unspecified abdominal pain: Secondary | ICD-10-CM | POA: Diagnosis present

## 2014-10-14 MED ORDER — SODIUM CHLORIDE 0.9 % IV BOLUS (SEPSIS)
1000.0000 mL | Freq: Once | INTRAVENOUS | Status: AC
  Administered 2014-10-14: 1000 mL via INTRAVENOUS

## 2014-10-14 MED ORDER — ONDANSETRON HCL 4 MG/2ML IJ SOLN
4.0000 mg | Freq: Once | INTRAMUSCULAR | Status: AC
  Administered 2014-10-14: 4 mg via INTRAVENOUS
  Filled 2014-10-14: qty 2

## 2014-10-14 MED ORDER — HYDROMORPHONE HCL 1 MG/ML IJ SOLN
1.0000 mg | Freq: Once | INTRAMUSCULAR | Status: AC
  Administered 2014-10-14: 1 mg via INTRAVENOUS
  Filled 2014-10-14: qty 1

## 2014-10-14 MED ORDER — KETOROLAC TROMETHAMINE 30 MG/ML IJ SOLN
15.0000 mg | Freq: Once | INTRAMUSCULAR | Status: AC
  Administered 2014-10-14: 15 mg via INTRAVENOUS
  Filled 2014-10-14: qty 1

## 2014-10-14 MED ORDER — ONDANSETRON HCL 4 MG PO TABS: 4.0000 mg | ORAL_TABLET | Freq: Four times a day (QID) | ORAL | Status: DC

## 2014-10-14 NOTE — ED Notes (Signed)
Pt states that he feels better.  Drank gingerale with no problems.  Sitting in chair with family at bedside at this time.

## 2014-10-14 NOTE — ED Notes (Signed)
NVD onset 3 am with abd pain.

## 2014-10-15 ENCOUNTER — Other Ambulatory Visit: Payer: Self-pay

## 2014-10-15 DIAGNOSIS — R079 Chest pain, unspecified: Secondary | ICD-10-CM

## 2014-10-21 NOTE — ED Provider Notes (Signed)
CSN: [id number]     Arrival date & time 10/14/14  1541 History   First MD Initiated Contact with Patient 10/14/14 1554     Chief Complaint  Patient presents with  . Abdominal Pain     (Consider location/radiation/quality/duration/timing/severity/associated sxs/prior Treatment) HPI   54 year old male with nausea, vomiting diarrhea. Symptom onset early this morning. Multiple episodes of each. Diffuse, crampy abdominal pain. No appreciable exacerbating relieving factors. No sick contacts. No fevers or chills. No urinary complaints. No significant recent travel history.  No recent abx use.   Past Medical History  Diagnosis Date  . Hyperlipidemia   . Hypertension   . Rectal spasm    Past Surgical History  Procedure Laterality Date  . Spine surgery      lower back   Family History  Problem Relation Age of Onset  . Diabetes Mother   . Cancer Sister     breast   History  Substance Use Topics  . Smoking status: Former Smoker    Types: Cigarettes    Quit date: 08/19/1993  . Smokeless tobacco: Never Used  . Alcohol Use: No    Review of Systems  All systems reviewed and negative, other than as noted in HPI.   Allergies  Review of patient's allergies indicates no known allergies.  Home Medications   Prior to Admission medications   Medication Sig Start Date End Date Taking? Authorizing Provider  aspirin EC 81 MG tablet Take 81 mg by mouth daily.   Yes Historical Provider, MD  benazepril (LOTENSIN) 40 MG tablet Take 1 tablet (40 mg total) by mouth daily. 09/01/14  Yes [name], MD  pravastatin (PRAVACHOL) 40 MG tablet Take 1 tablet (40 mg total) by mouth daily. 09/01/14  Yes [name], MD  triamterene-hydrochlorothiazide (MAXZIDE-25) 37.5-25 MG per tablet Take 1 tablet by mouth daily. 09/01/14  Yes [name], MD  ondansetron (ZOFRAN) 4 MG tablet Take 1 tablet (4 mg total) by mouth every 6 (six) hours. 10/14/14   Virgel Manifold, MD   BP 119/65 mmHg  Pulse 92   Temp(Src) 99.2 F (37.3 C) (Oral)  Resp 16  Ht 6\' 2"  (1.88 m)  Wt 260 lb (117.935 kg)  BMI 33.37 kg/m2  SpO2 96% Physical Exam  Constitutional: He appears well-developed and well-nourished. No distress.  HENT:  Head: Normocephalic and atraumatic.  Eyes: Conjunctivae are normal. Right eye exhibits no discharge. Left eye exhibits no discharge.  Neck: Neck supple.  Cardiovascular: Normal rate, regular rhythm and normal heart sounds.  Exam reveals no gallop and no friction rub.   No murmur heard. Pulmonary/Chest: Effort normal and breath sounds normal. No respiratory distress.  Abdominal: Soft. He exhibits no distension. There is no tenderness.  Musculoskeletal: He exhibits no edema or tenderness.  Neurological: He is alert.  Skin: Skin is warm and dry.  Psychiatric: He has a normal mood and affect. His behavior is normal. Thought content normal.  Nursing note and vitals reviewed.   ED Course  Procedures (including critical care time) Labs Review Labs Reviewed - No data to display  Imaging Review No results found.   EKG Interpretation None      MDM   Final diagnoses:  Nausea vomiting and diarrhea    54 year old male with nausea, vomiting and diarrhea. Benign abdominal exam. Patient tolerating ginger ale emergency room. States he feels better. Suspect viral illness.It has been determined that no acute conditions requiring further emergency intervention are present at this time. The patient has been advised  of the diagnosis and plan. I reviewed any labs and imaging including any potential incidental findings. We have discussed signs and symptoms that warrant return to the ED and they are listed in the discharge instructions. '    Virgel Manifold, MD 10/21/14 [phone]

## 2014-11-19 ENCOUNTER — Institutional Professional Consult (permissible substitution): Payer: 59 | Admitting: Cardiovascular Disease

## 2014-12-29 ENCOUNTER — Ambulatory Visit (INDEPENDENT_AMBULATORY_CARE_PROVIDER_SITE_OTHER): Payer: 59 | Admitting: Internal Medicine

## 2014-12-29 ENCOUNTER — Encounter: Payer: Self-pay | Admitting: Internal Medicine

## 2014-12-29 ENCOUNTER — Ambulatory Visit (INDEPENDENT_AMBULATORY_CARE_PROVIDER_SITE_OTHER)
Admission: RE | Admit: 2014-12-29 | Discharge: 2014-12-29 | Disposition: A | Discharge status: Home / Self Care | Payer: 59 | Source: Ambulatory Visit | Attending: Internal Medicine | Admitting: Internal Medicine

## 2014-12-29 VITALS — BP 130/80 | HR 52 | Ht 74.0 in | Wt 266.8 lb

## 2014-12-29 DIAGNOSIS — E785 Hyperlipidemia, unspecified: Secondary | ICD-10-CM

## 2014-12-29 DIAGNOSIS — I10 Essential (primary) hypertension: Secondary | ICD-10-CM | POA: Diagnosis not present

## 2014-12-29 NOTE — Patient Instructions (Signed)
Medication Instructions:  Your physician recommends that you continue on your current medications as directed. Please refer to the Current Medication list given to you today.  Labwork: No new orders.  Testing/Procedures: Your physician has recommended a calcium score.  Follow-Up: Dr Harrington Challenger will review calcium score and determine plan of care after this test.  At this time we will plan follow-up as needed.    Any Other Special Instructions Will Be Listed Below (If Applicable).

## 2014-12-29 NOTE — Progress Notes (Signed)
Cardiology Office Note   Date:  12/29/2014   ID:  Bruce Fisher, DOB 01-18-1961, MRN [id number]  PCP:  [name], MD  Cardiologist:   Dorris Carnes, MD   Chief Complaint  Patient presents with  . New Evaluation    chest pain      History of Present Illness: Bruce Fisher is a 54 y.o. male with a history of HTN and hyperlipidemia   Lipids in Jan 2015 LDL was 93 with 1454 particles  HDL 33   He was referred for CP  Eval/ cardiac risk eval.  Feeling good   No CP  Active  [employer] tires  [place] had one episode of chest burning  Lasted a few min.  L parasternal  Was not doing anything at time  No SOB       Has been bp meds several years   Chol med several years   Current Outpatient Prescriptions  Medication Sig Dispense Refill  . aspirin EC 81 MG tablet Take 81 mg by mouth daily.    . benazepril (LOTENSIN) 40 MG tablet Take 1 tablet (40 mg total) by mouth daily. 90 tablet 4  . pravastatin (PRAVACHOL) 40 MG tablet Take 1 tablet (40 mg total) by mouth daily. 90 tablet 4  . triamterene-hydrochlorothiazide (MAXZIDE-25) 37.5-25 MG per tablet Take 1 tablet by mouth daily. 90 tablet 4   No current facility-administered medications for this visit.    Allergies:   Review of patient's allergies indicates no known allergies.   Past Medical History  Diagnosis Date  . Hyperlipidemia   . Hypertension   . Rectal spasm     Past Surgical History  Procedure Laterality Date  . Spine surgery      lower back     Social History:  The patient  reports that he quit smoking about 21 years ago. His smoking use included Cigarettes. He has never used smokeless tobacco. He reports that he does not drink alcohol or use illicit drugs.   Family History:  The patient's family history includes Cancer in his sister; Diabetes in his mother.  Dad had stent at 59  Sister with CA died  One sister with valve problem    ROS:  Please see the history of present illness. All other systems  are reviewed and  Negative to the above problem except as noted.    PHYSICAL EXAM: VS:  BP 130/80 mmHg  Pulse 52  Ht 6\' 2"  (1.88 m)  Wt 266 lb 12.8 oz (121.02 kg)  BMI 34.24 kg/m2  GEN: Well nourished, well developed, in no acute distress HEENT: normal Neck: no JVD, carotid bruits, or masses Cardiac: RRR; no murmurs, rubs, or gallops,no edema  Respiratory:  clear to auscultation bilaterally, normal work of breathing GI: soft, nontender, nondistended, + BS  No hepatomegaly  MS: no deformity Moving all extremities   Skin: warm and dry, no rash Neuro:  Strength and sensation are intact Psych: euthymic mood, full affect   EKG:  EKG is ordered today.  Sinus bradycardia  52 bpm     Lipid Panel    Component Value Date/Time   CHOL 177 08/19/2013 1637   TRIG 208* 08/19/2013 1637   HDL 42 08/19/2013 1637   LDLCALC 93 08/19/2013 1637      Wt Readings from Last 3 Encounters:  12/29/14 266 lb 12.8 oz (121.02 kg)  10/14/14 260 lb (117.935 kg)  09/01/14 271 lb (122.925 kg)      ASSESSMENT AND PLAN:  1.  CP Atypical  I am not convinced it is cardiac in origin.  He is very anxious about his cardiac risk given FHx  BUt I do not think he carries same risk profile. I do not think stress test is indicated No symptoms of angina  He is very active I would set him up for calcium score CT  2.  HTN  Good control  3.  HL  Fair control  Further Rx will depend on CT results.    F/U will be based on test results   Signed, Dorris Carnes, MD  12/29/2014 11:23 AM    [facility] [address], [place], [place]  [address] Phone: [phone]; Fax: [phone]

## 2015-03-03 ENCOUNTER — Ambulatory Visit: Payer: 59 | Admitting: Family Medicine

## 2015-03-06 ENCOUNTER — Encounter: Payer: Self-pay | Admitting: Family Medicine

## 2015-03-23 ENCOUNTER — Ambulatory Visit (INDEPENDENT_AMBULATORY_CARE_PROVIDER_SITE_OTHER): Payer: 59 | Admitting: Family Medicine

## 2015-03-23 ENCOUNTER — Encounter: Payer: Self-pay | Admitting: Family Medicine

## 2015-03-23 VITALS — BP 118/65 | HR 60 | Temp 98.3°F | Ht 74.0 in | Wt 258.0 lb

## 2015-03-23 DIAGNOSIS — I10 Essential (primary) hypertension: Secondary | ICD-10-CM

## 2015-03-23 DIAGNOSIS — E785 Hyperlipidemia, unspecified: Secondary | ICD-10-CM | POA: Diagnosis not present

## 2015-03-23 DIAGNOSIS — Z139 Encounter for screening, unspecified: Secondary | ICD-10-CM | POA: Diagnosis not present

## 2015-03-23 DIAGNOSIS — N529 Male erectile dysfunction, unspecified: Secondary | ICD-10-CM

## 2015-03-23 MED ORDER — SILDENAFIL CITRATE 20 MG PO TABS: 20.0000 mg | ORAL_TABLET | Freq: Every day | ORAL | Status: DC | PRN

## 2015-03-23 NOTE — Progress Notes (Signed)
Subjective:  Patient ID: Bruce Fisher, male    DOB: 03/05/1961  Age: 54 y.o. MRN: [id number]  CC: Hyperlipidemia and Hypertension   HPI Bruce Fisher presents for  follow-up of hypertension. Patient has no history of headache chest pain or shortness of breath or recent cough. Patient also denies symptoms of TIA such as numbness weakness lateralizing. Patient checks  blood pressure at home and has not had any elevated readings recently. Patient denies side effects from his medication. States taking it regularly.  Patient also  in for follow-up of elevated cholesterol. Doing well without complaints on current medication. Denies side effects of statin including myalgia and arthralgia and nausea. Also in today for liver function testing. Currently no chest pain, shortness of breath or other cardiovascular related symptoms noted.  Pt. Also has tried viagra for ED in the past and had a severe HA. It resolved spontaneously. However he chose not to use the medicine again. That has been a few years ago. At this point his ED has become more significant. He would like to know about alternatives to Viagra.    History Bruce Fisher has a past medical history of Hyperlipidemia; Hypertension; and Rectal spasm.   He has past surgical history that includes Spine surgery.   His family history includes Cancer in his sister; Diabetes in his mother.He reports that he quit smoking about 21 years ago. His smoking use included Cigarettes. He has never used smokeless tobacco. He reports that he does not drink alcohol or use illicit drugs.  Current Outpatient Prescriptions on File Prior to Visit  Medication Sig Dispense Refill  . aspirin EC 81 MG tablet Take 81 mg by mouth daily.    . benazepril (LOTENSIN) 40 MG tablet Take 1 tablet (40 mg total) by mouth daily. 90 tablet 4  . pravastatin (PRAVACHOL) 40 MG tablet Take 1 tablet (40 mg total) by mouth daily. 90 tablet 4  . triamterene-hydrochlorothiazide (MAXZIDE-25)  37.5-25 MG per tablet Take 1 tablet by mouth daily. 90 tablet 4   No current facility-administered medications on file prior to visit.    ROS Review of Systems  Constitutional: Negative for fever, chills and diaphoresis.  HENT: Negative for congestion, rhinorrhea and sore throat.   Respiratory: Negative for cough, shortness of breath and wheezing.   Cardiovascular: Negative for chest pain.  Gastrointestinal: Negative for nausea, vomiting, abdominal pain, diarrhea, constipation and abdominal distention.  Genitourinary: Negative for dysuria and frequency.  Musculoskeletal: Negative for joint swelling and arthralgias.  Skin: Negative for rash.  Neurological: Negative for headaches.    Objective:  BP 118/65 mmHg  Pulse 60  Temp(Src) 98.3 F (36.8 C) (Oral)  Ht '6\' 2"'  (1.88 m)  Wt 258 lb (117.028 kg)  BMI 33.11 kg/m2  BP Readings from Last 3 Encounters:  03/23/15 118/65  12/29/14 130/80  10/14/14 119/65    Wt Readings from Last 3 Encounters:  03/23/15 258 lb (117.028 kg)  12/29/14 266 lb 12.8 oz (121.02 kg)  10/14/14 260 lb (117.935 kg)     Physical Exam  Constitutional: He appears well-developed and well-nourished.  HENT:  Head: Normocephalic and atraumatic.  Right Ear: Tympanic membrane and external ear normal. No decreased hearing is noted.  Left Ear: Tympanic membrane and external ear normal. No decreased hearing is noted.  Mouth/Throat: No oropharyngeal exudate or posterior oropharyngeal erythema.  Eyes: Pupils are equal, round, and reactive to light.  Neck: Normal range of motion. Neck supple.  Cardiovascular: Normal rate and regular rhythm.  No murmur heard. Pulmonary/Chest: Breath sounds normal. No respiratory distress.  Abdominal: Soft. Bowel sounds are normal. He exhibits no mass. There is no tenderness.  Vitals reviewed.   No results found for: HGBA1C  Lab Results  Component Value Date   WBC 9.1 05/11/2010   HGB 15.3 05/11/2010   HCT 45.0 05/11/2010    PLT 164 05/11/2010   GLUCOSE 92 08/19/2013   CHOL 177 08/19/2013   TRIG 208* 08/19/2013   HDL 42 08/19/2013   LDLCALC 93 08/19/2013   ALT 25 08/19/2013   AST 25 08/19/2013   NA 143 08/19/2013   K 3.9 08/19/2013   CL 103 08/19/2013   CREATININE 1.03 08/19/2013   BUN 16 08/19/2013   CO2 23 08/19/2013   PSA 1.4 08/19/2013    Ct Cardiac Scoring  12/29/2014   ADDENDUM REPORT: 12/29/2014 16:47  EXAM: OVER-READ INTERPRETATION  CT CHEST  The following report is an over-read performed by radiologist Dr. Fonnie Birkenhead [facility] on 12/29/2014. This over-read does not include interpretation of cardiac or coronary anatomy or pathology. The interpretation by the cardiologist is attached.  COMPARISON:  None.  FINDINGS: Heart size normal. No pericardial or pleural effusion. Calcified granuloma in the right lower lobe. 5 mm subpleural nodule in the right middle lobe is likely a subpleural lymph node. Visualized portions of the upper abdomen and bones are unremarkable.  IMPRESSION: 1. No acute findings. 2. 5 mm subpleural right middle lobe nodule is likely a subpleural lymph node. If the patient is at high risk for bronchogenic carcinoma, follow-up chest CT at 6-12 months is recommended. If the patient is at low risk for bronchogenic carcinoma, follow-up chest CT at 12 months is recommended. This recommendation follows the consensus statement: Guidelines for Management of Small Pulmonary Nodules Detected on CT Scans: A Statement from the [place] as published in Radiology 2005;237:395-400.   Electronically Signed   By: Lorin Picket M.D.   On: 12/29/2014 16:47   12/29/2014   EXAM: Risk stratification  Coronary Calcium Score  TECHNIQUE: The patient was scanned on a Siemens Sensation 16 slice scanner. Axial non-contrast 30m slices were carried out through the heart. The data set was analyzed on a dedicated work station and scored using the [place]  FINDINGS: Non-cardiac: No  significant non cardiac findings on limited lung and soft tissue windows. [facility]  Ascending Aorta:  3.2 cm  Pericardium: Normal  Coronary arteries:  No coronary calcium identified  IMPRESSION: Coronary calcium score of 0.  PJenkins Rouge Electronically Signed: By: PJenkins RougeM.D. On: 12/29/2014 14:53    Assessment & Plan:   RAlthea Grimmerwas seen today for hyperlipidemia and hypertension.  Diagnoses and all orders for this visit:  Erectile dysfunction, unspecified erectile dysfunction type  Hyperlipidemia -     CMP14+EGFR -     Lipid panel  Essential hypertension -     CBC with Differential/Platelet -     CMP14+EGFR  Screening -     PSA, total and free -     TSH -     Vit D  25 hydroxy (rtn osteoporosis monitoring) -     T4, Free  Other orders -     Discontinue: sildenafil (REVATIO) 20 MG tablet; Take 1 tablet (20 mg total) by mouth daily as needed (2-5 as needed). -     sildenafil (REVATIO) 20 MG tablet; Take 1 tablet (20 mg total) by mouth daily as needed (2-5 as  needed).   I am having Mr. Steve maintain his aspirin EC, benazepril, pravastatin, triamterene-hydrochlorothiazide, and sildenafil.  Meds ordered this encounter  Medications  . DISCONTD: sildenafil (REVATIO) 20 MG tablet    Sig: Take 1 tablet (20 mg total) by mouth daily as needed (2-5 as needed).    Dispense:  50 tablet    Refill:  5  . sildenafil (REVATIO) 20 MG tablet    Sig: Take 1 tablet (20 mg total) by mouth daily as needed (2-5 as needed).    Dispense:  50 tablet    Refill:  5   Over 25 minutes was spent in discussion with this patient regarding erectile dysfunction and various options specifically with regard to his problem with headache and his lack of understanding about the use of daily Cialis.  Follow-up: Return in about 6 months (around 09/22/2015).  [name], M.D.

## 2015-03-24 ENCOUNTER — Other Ambulatory Visit: Payer: Self-pay | Admitting: Family Medicine

## 2015-03-24 LAB — CMP14+EGFR
A/G RATIO: 1.3 (ref 1.1–2.5)
ALT: 15 IU/L (ref 0–44)
AST: 15 IU/L (ref 0–40)
Albumin: 4.4 g/dL (ref 3.5–5.5)
Alkaline Phosphatase: 65 IU/L (ref 39–117)
BILIRUBIN TOTAL: 0.3 mg/dL (ref 0.0–1.2)
BUN/Creatinine Ratio: 16 (ref 9–20)
BUN: 16 mg/dL (ref 6–24)
CALCIUM: 9.6 mg/dL (ref 8.7–10.2)
CO2: 25 mmol/L (ref 18–29)
Chloride: 101 mmol/L (ref 97–108)
Creatinine, Ser: 1.02 mg/dL (ref 0.76–1.27)
GFR, EST AFRICAN AMERICAN: 98 mL/min/{1.73_m2} (ref >59)
GFR, EST NON AFRICAN AMERICAN: 85 mL/min/{1.73_m2} (ref >59)
Globulin, Total: 3.5 g/dL (ref 1.5–4.5)
Glucose: 87 mg/dL (ref 65–99)
POTASSIUM: 3.9 mmol/L (ref 3.5–5.2)
SODIUM: 142 mmol/L (ref 134–144)
TOTAL PROTEIN: 7.9 g/dL (ref 6.0–8.5)

## 2015-03-24 LAB — PSA, TOTAL AND FREE
PROSTATE SPECIFIC AG, SERUM: 1.7 ng/mL (ref 0.0–4.0)
PSA, Free Pct: 40 %
PSA, Free: 0.68 ng/mL

## 2015-03-24 LAB — CBC WITH DIFFERENTIAL/PLATELET
BASOS: 0 %
Basophils Absolute: 0 10*3/uL (ref 0.0–0.2)
EOS (ABSOLUTE): 0.1 10*3/uL (ref 0.0–0.4)
EOS: 2 %
HEMATOCRIT: 45.3 % (ref 37.5–51.0)
HEMOGLOBIN: 14.8 g/dL (ref 12.6–17.7)
Immature Grans (Abs): 0 10*3/uL (ref 0.0–0.1)
Immature Granulocytes: 0 %
LYMPHS ABS: 3.1 10*3/uL (ref 0.7–3.1)
Lymphs: 41 %
MCH: 29.8 pg (ref 26.6–33.0)
MCHC: 32.7 g/dL (ref 31.5–35.7)
MCV: 91 fL (ref 79–97)
MONOCYTES: 7 %
Monocytes Absolute: 0.5 10*3/uL (ref 0.1–0.9)
NEUTROS ABS: 3.9 10*3/uL (ref 1.4–7.0)
Neutrophils: 50 %
Platelets: 189 10*3/uL (ref 150–379)
RBC: 4.97 x10E6/uL (ref 4.14–5.80)
RDW: 14.3 % (ref 12.3–15.4)
WBC: 7.7 10*3/uL (ref 3.4–10.8)

## 2015-03-24 LAB — T4, FREE: Free T4: 1.12 ng/dL (ref 0.82–1.77)

## 2015-03-24 LAB — LIPID PANEL
CHOL/HDL RATIO: 5 ratio (ref 0.0–5.0)
Cholesterol, Total: 186 mg/dL (ref 100–199)
HDL: 37 mg/dL — ABNORMAL LOW (ref >39)
LDL Calculated: 97 mg/dL (ref 0–99)
Triglycerides: 261 mg/dL — ABNORMAL HIGH (ref 0–149)
VLDL Cholesterol Cal: 52 mg/dL — ABNORMAL HIGH (ref 5–40)

## 2015-03-24 LAB — VITAMIN D 25 HYDROXY (VIT D DEFICIENCY, FRACTURES): Vit D, 25-Hydroxy: 21.8 ng/mL — ABNORMAL LOW (ref 30.0–100.0)

## 2015-03-24 LAB — TSH: TSH: 1 u[IU]/mL (ref 0.450–4.500)

## 2015-03-24 MED ORDER — VITAMIN D (ERGOCALCIFEROL) 1.25 MG (50000 UNIT) PO CAPS: 50000.0000 [IU] | ORAL_CAPSULE | ORAL | Status: DC

## 2015-04-24 ENCOUNTER — Telehealth: Payer: Self-pay | Admitting: Family Medicine

## 2015-04-24 NOTE — Telephone Encounter (Signed)
Aware, last lab was in August 2016 and prostrate was normal.

## 2015-05-12 ENCOUNTER — Ambulatory Visit: Payer: 59 | Admitting: Family Medicine

## 2015-05-19 ENCOUNTER — Ambulatory Visit (INDEPENDENT_AMBULATORY_CARE_PROVIDER_SITE_OTHER): Payer: 59 | Admitting: Family Medicine

## 2015-05-19 ENCOUNTER — Encounter: Payer: Self-pay | Admitting: Family Medicine

## 2015-05-19 VITALS — BP 153/83 | HR 62 | Temp 98.3°F | Ht 74.0 in | Wt 263.0 lb

## 2015-05-19 DIAGNOSIS — I10 Essential (primary) hypertension: Secondary | ICD-10-CM

## 2015-05-19 DIAGNOSIS — K2100 Gastro-esophageal reflux disease with esophagitis, without bleeding: Secondary | ICD-10-CM

## 2015-05-19 DIAGNOSIS — N529 Male erectile dysfunction, unspecified: Secondary | ICD-10-CM | POA: Diagnosis not present

## 2015-05-19 DIAGNOSIS — K219 Gastro-esophageal reflux disease without esophagitis: Secondary | ICD-10-CM | POA: Insufficient documentation

## 2015-05-19 MED ORDER — PANTOPRAZOLE SODIUM 40 MG PO TBEC: 40.0000 mg | DELAYED_RELEASE_TABLET | Freq: Every day | ORAL | Status: DC

## 2015-05-19 MED ORDER — VARDENAFIL HCL 10 MG PO TBDP: ORAL_TABLET | ORAL | Status: DC

## 2015-05-19 NOTE — Progress Notes (Signed)
Subjective:  Patient ID: Bruce Fisher, male    DOB: Oct 29, 1960  Age: 54 y.o. MRN: [id number]  CC: Gastroesophageal Reflux and Prostate Check   HPI Bruce Fisher presents for heartburn symptoms. Pain is substernal and epigastric. It is worse when laying down at night. Particularly it gets worse when he eats late in the day. There is perhaps some association with acidic foods but patient is not sure about that. He does wake up during the night with heartburn at times. He gets relief with over-the-counter antacids such as Tums.  Patient in for follow-up of elevated cholesterol. Doing well without complaints on current medication. Denies side effects of statin including myalgia and arthralgia and nausea. Also in today for liver function testing. Currently no chest pain, shortness of breath or other cardiovascular related symptoms noted.   follow-up of hypertension. Patient has no history of headache chest pain or shortness of breath or recent cough. Patient also denies symptoms of TIA such as numbness weakness lateralizing. Patient checks  blood pressure at home and has not had any elevated readings recently. Patient denies side effects from his medication. States taking it regularly.    He suffers from erectile dysfunction. He could not tolerate provide she has due to headache and congestion. He would like to try something else. It is heard about injections or possible topicals.  History Stepan has a past medical history of Hyperlipidemia; Hypertension; and Rectal spasm.   He has past surgical history that includes Spine surgery.   His family history includes Cancer in his sister; Diabetes in his mother.He reports that he quit smoking about 21 years ago. His smoking use included Cigarettes. He has never used smokeless tobacco. He reports that he does not drink alcohol or use illicit drugs.  Outpatient Prescriptions Prior to Visit  Medication Sig Dispense Refill  . aspirin EC 81 MG tablet  Take 81 mg by mouth daily.    . benazepril (LOTENSIN) 40 MG tablet Take 1 tablet (40 mg total) by mouth daily. 90 tablet 4  . pravastatin (PRAVACHOL) 40 MG tablet Take 1 tablet (40 mg total) by mouth daily. 90 tablet 4  . triamterene-hydrochlorothiazide (MAXZIDE-25) 37.5-25 MG per tablet Take 1 tablet by mouth daily. 90 tablet 4  . sildenafil (REVATIO) 20 MG tablet Take 1 tablet (20 mg total) by mouth daily as needed (2-5 as needed). (Patient not taking: Reported on 05/19/2015) 50 tablet 5  . Vitamin D, Ergocalciferol, (DRISDOL) 50000 UNITS CAPS capsule Take 1 capsule (50,000 Units total) by mouth 2 (two) times a week. (Patient not taking: Reported on 05/19/2015) 16 capsule 0   No facility-administered medications prior to visit.    ROS Review of Systems  Constitutional: Negative for fever, chills and diaphoresis.  HENT: Negative for congestion, rhinorrhea and sore throat.   Respiratory: Negative for cough, shortness of breath and wheezing.   Cardiovascular: Negative for chest pain.  Gastrointestinal: Positive for abdominal pain and abdominal distention. Negative for nausea, vomiting, diarrhea and constipation.  Genitourinary: Negative for dysuria and frequency.  Musculoskeletal: Negative for joint swelling and arthralgias.  Skin: Negative for rash.  Neurological: Negative for headaches.    Objective:  BP 153/83 mmHg  Pulse 62  Temp(Src) 98.3 F (36.8 C) (Oral)  Ht 6\' 2"  (1.88 m)  Wt 263 lb (119.296 kg)  BMI 33.75 kg/m2  BP Readings from Last 3 Encounters:  05/19/15 153/83  03/23/15 118/65  12/29/14 130/80    Wt Readings from Last 3 Encounters:  05/19/15 263  lb (119.296 kg)  03/23/15 258 lb (117.028 kg)  12/29/14 266 lb 12.8 oz (121.02 kg)     Physical Exam  Constitutional: He is oriented to person, place, and time. He appears well-developed and well-nourished. No distress.  HENT:  Head: Normocephalic and atraumatic.  Right Ear: External ear normal.  Left Ear:  External ear normal.  Nose: Nose normal.  Mouth/Throat: Oropharynx is clear and moist.  Eyes: Conjunctivae and EOM are normal. Pupils are equal, round, and reactive to light.  Neck: Normal range of motion. Neck supple. No thyromegaly present.  Cardiovascular: Normal rate, regular rhythm and normal heart sounds.   No murmur heard. Pulmonary/Chest: Effort normal and breath sounds normal. No respiratory distress. He has no wheezes. He has no rales.  Abdominal: Soft. Bowel sounds are normal. He exhibits no distension. There is no tenderness.  Lymphadenopathy:    He has no cervical adenopathy.  Neurological: He is alert and oriented to person, place, and time. He has normal reflexes.  Skin: Skin is warm and dry.  Psychiatric: He has a normal mood and affect. His behavior is normal. Judgment and thought content normal.    No results found for: HGBA1C  Lab Results  Component Value Date   WBC 7.7 03/23/2015   HGB 15.3 05/11/2010   HCT 45.3 03/23/2015   PLT 164 05/11/2010   GLUCOSE 87 03/23/2015   CHOL 186 03/23/2015   TRIG 261* 03/23/2015   HDL 37* 03/23/2015   LDLCALC 97 03/23/2015   ALT 15 03/23/2015   AST 15 03/23/2015   NA 142 03/23/2015   K 3.9 03/23/2015   CL 101 03/23/2015   CREATININE 1.02 03/23/2015   BUN 16 03/23/2015   CO2 25 03/23/2015   TSH 1.000 03/23/2015   PSA 1.7 03/23/2015    Ct Cardiac Scoring  12/29/2014  ADDENDUM REPORT: 12/29/2014 16:47 EXAM: OVER-READ INTERPRETATION  CT CHEST The following report is an over-read performed by radiologist Dr. Fonnie [facility]head [facility] on 12/29/2014. This over-read does not include interpretation of cardiac or coronary anatomy or pathology. The interpretation by the cardiologist is attached. COMPARISON:  None. FINDINGS: Heart size normal. No pericardial or pleural effusion. Calcified granuloma in the right lower lobe. 5 mm subpleural nodule in the right middle lobe is likely a subpleural lymph node. Visualized  portions of the upper abdomen and bones are unremarkable. IMPRESSION: 1. No acute findings. 2. 5 mm subpleural right middle lobe nodule is likely a subpleural lymph node. If the patient is at high risk for bronchogenic carcinoma, follow-up chest CT at 6-12 months is recommended. If the patient is at low risk for bronchogenic carcinoma, follow-up chest CT at 12 months is recommended. This recommendation follows the consensus statement: Guidelines for Management of Small Pulmonary Nodules Detected on CT Scans: A Statement from the [place] as published in Radiology 2005;237:395-400. Electronically Signed   By: Lorin Picket M.D.   On: 12/29/2014 16:47   12/29/2014  EXAM: Risk stratification Coronary Calcium Score TECHNIQUE: The patient was scanned on a Siemens Sensation 16 slice scanner. Axial non-contrast 74mm slices were carried out through the heart. The data set was analyzed on a dedicated work station and scored using the [place]. FINDINGS: Non-cardiac: No significant non cardiac findings on limited lung and soft tissue windows. See separate report from [facility]. Ascending Aorta:  3.2 cm Pericardium: Normal Coronary arteries:  No coronary calcium identified IMPRESSION: Coronary calcium score of 0. Jenkins Rouge Electronically Signed: By: Jenkins Rouge  M.D. On: 12/29/2014 14:53    Assessment & Plan:   Althea Grimmer was seen today for gastroesophageal reflux and prostate check.  Diagnoses and all orders for this visit:  Erectile dysfunction, unspecified erectile dysfunction type  Essential hypertension  Gastroesophageal reflux disease with esophagitis -     DG UGI  W/KUB; Future  Other orders -     pantoprazole (PROTONIX) 40 MG tablet; Take 1 tablet (40 mg total) by mouth daily. For stomach -     Vardenafil HCl (STAXYN) 10 MG TBDP; 1 by mouth as needed for sexual activity   I have discontinued Mr. Oviatt's Vitamin D (Ergocalciferol). I am also having him start on  pantoprazole and Vardenafil HCl. Additionally, I am having him maintain his aspirin EC, benazepril, pravastatin, triamterene-hydrochlorothiazide, and sildenafil.  Meds ordered this encounter  Medications  . pantoprazole (PROTONIX) 40 MG tablet    Sig: Take 1 tablet (40 mg total) by mouth daily. For stomach    Dispense:  30 tablet    Refill:  3  . Vardenafil HCl (STAXYN) 10 MG TBDP    Sig: 1 by mouth as needed for sexual activity    Dispense:  30 tablet    Refill:  10   [place] and was substituted for Cialis due to formulary issues with the patient's insurance. We will renew this) and side effects. If that occurs will need to refer him to urology.  Follow-up: Return in about 1 month (around 06/19/2015) for CPE.  [name], M.D.

## 2015-05-26 ENCOUNTER — Telehealth: Payer: Self-pay

## 2015-05-26 MED ORDER — TADALAFIL 20 MG PO TABS: 10.0000 mg | ORAL_TABLET | ORAL | Status: DC | PRN

## 2015-05-26 NOTE — Telephone Encounter (Signed)
Insurance denied Staxyn because medication is an excluded product

## 2015-05-26 NOTE — Telephone Encounter (Signed)
I can't offer any guarantees of coverage for any of these kinds of medicines. However I will send in a prescription for Cialis. Hopefully that will be covered better.

## 2015-05-27 ENCOUNTER — Telehealth: Payer: Self-pay | Admitting: Family Medicine

## 2015-08-03 ENCOUNTER — Ambulatory Visit: Payer: 59 | Admitting: Family Medicine

## 2015-09-15 ENCOUNTER — Other Ambulatory Visit: Payer: Self-pay | Admitting: Family Medicine

## 2015-09-16 ENCOUNTER — Other Ambulatory Visit: Payer: Self-pay | Admitting: Family Medicine

## 2015-09-16 MED ORDER — BENAZEPRIL HCL 40 MG PO TABS: 40.0000 mg | ORAL_TABLET | Freq: Every day | ORAL | Status: DC

## 2015-09-16 MED ORDER — PRAVASTATIN SODIUM 40 MG PO TABS: 40.0000 mg | ORAL_TABLET | Freq: Every day | ORAL | Status: DC

## 2015-09-16 MED ORDER — TRIAMTERENE-HCTZ 37.5-25 MG PO TABS: 1.0000 | ORAL_TABLET | Freq: Every day | ORAL | Status: DC

## 2015-09-16 NOTE — Telephone Encounter (Signed)
Last seen 05/19/15 Dr Livia Snellen   Last lipid 03/23/15

## 2015-09-16 NOTE — Telephone Encounter (Signed)
Done, pt aware.

## 2015-12-04 ENCOUNTER — Emergency Department (HOSPITAL_COMMUNITY)
Admission: EM | Admit: 2015-12-04 | Discharge: 2015-12-04 | Disposition: A | Discharge status: Home / Self Care | Payer: Self-pay | Attending: Emergency Medicine | Admitting: Emergency Medicine

## 2015-12-04 ENCOUNTER — Encounter (HOSPITAL_COMMUNITY): Payer: Self-pay | Admitting: Emergency Medicine

## 2015-12-04 ENCOUNTER — Emergency Department (HOSPITAL_COMMUNITY): Payer: Self-pay

## 2015-12-04 DIAGNOSIS — I10 Essential (primary) hypertension: Secondary | ICD-10-CM | POA: Insufficient documentation

## 2015-12-04 DIAGNOSIS — R109 Unspecified abdominal pain: Secondary | ICD-10-CM

## 2015-12-04 DIAGNOSIS — N201 Calculus of ureter: Secondary | ICD-10-CM | POA: Insufficient documentation

## 2015-12-04 DIAGNOSIS — E785 Hyperlipidemia, unspecified: Secondary | ICD-10-CM | POA: Insufficient documentation

## 2015-12-04 DIAGNOSIS — Z87891 Personal history of nicotine dependence: Secondary | ICD-10-CM | POA: Insufficient documentation

## 2015-12-04 DIAGNOSIS — N390 Urinary tract infection, site not specified: Secondary | ICD-10-CM | POA: Insufficient documentation

## 2015-12-04 HISTORY — DX: Disorder of kidney and ureter, unspecified: N28.9

## 2015-12-04 LAB — CBC
HEMATOCRIT: 43 % (ref 39.0–52.0)
HEMOGLOBIN: 14.1 g/dL (ref 13.0–17.0)
MCH: 29.9 pg (ref 26.0–34.0)
MCHC: 32.8 g/dL (ref 30.0–36.0)
MCV: 91.1 fL (ref 78.0–100.0)
Platelets: 165 10*3/uL (ref 150–400)
RBC: 4.72 MIL/uL (ref 4.22–5.81)
RDW: 13.8 % (ref 11.5–15.5)
WBC: 13.7 10*3/uL — ABNORMAL HIGH (ref 4.0–10.5)

## 2015-12-04 LAB — BASIC METABOLIC PANEL
ANION GAP: 5 (ref 5–15)
BUN: 20 mg/dL (ref 6–20)
CALCIUM: 9 mg/dL (ref 8.9–10.3)
CHLORIDE: 103 mmol/L (ref 101–111)
CO2: 27 mmol/L (ref 22–32)
Creatinine, Ser: 1.23 mg/dL (ref 0.61–1.24)
GFR calc Af Amer: >60 mL/min (ref >60)
GFR calc non Af Amer: >60 mL/min (ref >60)
GLUCOSE: 134 mg/dL — AB (ref 65–99)
Potassium: 3.9 mmol/L (ref 3.5–5.1)
Sodium: 135 mmol/L (ref 135–145)

## 2015-12-04 LAB — URINE MICROSCOPIC-ADD ON: SQUAMOUS EPITHELIAL / LPF: NONE SEEN

## 2015-12-04 LAB — URINALYSIS, ROUTINE W REFLEX MICROSCOPIC
Bilirubin Urine: NEGATIVE
GLUCOSE, UA: NEGATIVE mg/dL
KETONES UR: NEGATIVE mg/dL
LEUKOCYTES UA: NEGATIVE
Nitrite: NEGATIVE
PH: 6.5 (ref 5.0–8.0)
Protein, ur: NEGATIVE mg/dL
Specific Gravity, Urine: 1.01 (ref 1.005–1.030)

## 2015-12-04 MED ORDER — KETOROLAC TROMETHAMINE 30 MG/ML IJ SOLN
30.0000 mg | Freq: Once | INTRAMUSCULAR | Status: AC
  Administered 2015-12-04: 30 mg via INTRAVENOUS
  Filled 2015-12-04: qty 1

## 2015-12-04 MED ORDER — ONDANSETRON HCL 4 MG PO TABS: 4.0000 mg | ORAL_TABLET | Freq: Four times a day (QID) | ORAL | Status: DC

## 2015-12-04 MED ORDER — HYDROMORPHONE HCL 1 MG/ML IJ SOLN
1.0000 mg | Freq: Once | INTRAMUSCULAR | Status: AC
  Administered 2015-12-04: 1 mg via INTRAVENOUS
  Filled 2015-12-04: qty 1

## 2015-12-04 MED ORDER — ONDANSETRON HCL 4 MG/2ML IJ SOLN: 4.0000 mg | Freq: Once | INTRAMUSCULAR | Status: DC

## 2015-12-04 MED ORDER — OXYCODONE-ACETAMINOPHEN 5-325 MG PO TABS: 1.0000 | ORAL_TABLET | ORAL | Status: DC | PRN

## 2015-12-04 MED ORDER — ONDANSETRON HCL 4 MG/2ML IJ SOLN
INTRAMUSCULAR | Status: AC
  Filled 2015-12-04: qty 2

## 2015-12-04 MED ORDER — IBUPROFEN 400 MG PO TABS: 400.0000 mg | ORAL_TABLET | Freq: Three times a day (TID) | ORAL | Status: DC

## 2015-12-04 MED ORDER — ONDANSETRON HCL 4 MG/2ML IJ SOLN
4.0000 mg | Freq: Once | INTRAMUSCULAR | Status: AC
Start: 2015-12-04 — End: 2015-12-04
  Administered 2015-12-04: 4 mg via INTRAVENOUS

## 2015-12-04 MED ORDER — SODIUM CHLORIDE 0.9 % IV BOLUS (SEPSIS)
1000.0000 mL | Freq: Once | INTRAVENOUS | Status: AC
  Administered 2015-12-04: 1000 mL via INTRAVENOUS

## 2015-12-04 MED ORDER — FENTANYL CITRATE (PF) 100 MCG/2ML IJ SOLN
INTRAMUSCULAR | Status: AC
  Filled 2015-12-04: qty 2

## 2015-12-04 MED ORDER — FENTANYL CITRATE (PF) 100 MCG/2ML IJ SOLN
50.0000 ug | Freq: Once | INTRAMUSCULAR | Status: AC
  Administered 2015-12-04: 50 ug via INTRAVENOUS

## 2015-12-04 MED ORDER — DEXTROSE 5 % IV SOLN
1.0000 g | Freq: Once | INTRAVENOUS | Status: AC
  Administered 2015-12-04: 1 g via INTRAVENOUS
  Filled 2015-12-04: qty 10

## 2015-12-04 MED ORDER — CEPHALEXIN 500 MG PO CAPS: 500.0000 mg | ORAL_CAPSULE | Freq: Four times a day (QID) | ORAL | Status: DC

## 2015-12-04 NOTE — ED Notes (Signed)
Drinking water without difficulty.

## 2015-12-04 NOTE — ED Notes (Signed)
Pt states he has been having left flank pain for about 2 hours.

## 2015-12-04 NOTE — Discharge Instructions (Signed)
Kidney Stones Take the pain medications and antibiotics as prescribed. Followup with your doctor. Return to the ED if you develop worsening pain, fever, vomiting, or any other concerns. Return to the ED if you develop new or worsening symptoms. Kidney stones (urolithiasis) are deposits that form inside your kidneys. The intense pain is caused by the stone moving through the urinary tract. When the stone moves, the ureter goes into spasm around the stone. The stone is usually passed in the urine.  CAUSES   A disorder that makes certain neck glands produce too much parathyroid hormone (primary hyperparathyroidism).  A buildup of uric acid crystals, similar to gout in your joints.  Narrowing (stricture) of the ureter.  A kidney obstruction present at birth (congenital obstruction).  Previous surgery on the kidney or ureters.  Numerous kidney infections. SYMPTOMS   Feeling sick to your stomach (nauseous).  Throwing up (vomiting).  Blood in the urine (hematuria).  Pain that usually spreads (radiates) to the groin.  Frequency or urgency of urination. DIAGNOSIS   Taking a history and physical exam.  Blood or urine tests.  CT scan.  Occasionally, an examination of the inside of the urinary bladder (cystoscopy) is performed. TREATMENT   Observation.  Increasing your fluid intake.  Extracorporeal shock wave lithotripsy--This is a noninvasive procedure that uses shock waves to break up kidney stones.  Surgery may be needed if you have severe pain or persistent obstruction. There are various surgical procedures. Most of the procedures are performed with the use of small instruments. Only small incisions are needed to accommodate these instruments, so recovery time is minimized. The size, location, and chemical composition are all important variables that will determine the proper choice of action for you. Talk to your health care provider to better understand your situation so that  you will minimize the risk of injury to yourself and your kidney.  HOME CARE INSTRUCTIONS   Drink enough water and fluids to keep your urine clear or pale yellow. This will help you to pass the stone or stone fragments.  Strain all urine through the provided strainer. Keep all particulate matter and stones for your health care provider to see. The stone causing the pain may be as small as a grain of salt. It is very important to use the strainer each and every time you pass your urine. The collection of your stone will allow your health care provider to analyze it and verify that a stone has actually passed. The stone analysis will often identify what you can do to reduce the incidence of recurrences.  Only take over-the-counter or prescription medicines for pain, discomfort, or fever as directed by your health care provider.  Keep all follow-up visits as told by your health care provider. This is important.  Get follow-up X-rays if required. The absence of pain does not always mean that the stone has passed. It may have only stopped moving. If the urine remains completely obstructed, it can cause loss of kidney function or even complete destruction of the kidney. It is your responsibility to make sure X-rays and follow-ups are completed. Ultrasounds of the kidney can show blockages and the status of the kidney. Ultrasounds are not associated with any radiation and can be performed easily in a matter of minutes.  Make changes to your daily diet as told by your health care provider. You may be told to:  Limit the amount of salt that you eat.  Eat 5 or more servings of fruits and  vegetables each day.  Limit the amount of meat, poultry, fish, and eggs that you eat.  Collect a 24-hour urine sample as told by your health care provider.You may need to collect another urine sample every 6-12 months. SEEK MEDICAL CARE IF:  You experience pain that is progressive and unresponsive to any pain medicine  you have been prescribed. SEEK IMMEDIATE MEDICAL CARE IF:   Pain cannot be controlled with the prescribed medicine.  You have a fever or shaking chills.  The severity or intensity of pain increases over 18 hours and is not relieved by pain medicine.  You develop a new onset of abdominal pain.  You feel faint or pass out.  You are unable to urinate.   This information is not intended to replace advice given to you by your health care provider. Make sure you discuss any questions you have with your health care provider.   Document Released: 07/11/2005 Document Revised: 04/01/2015 Document Reviewed: 12/12/2012 Elsevier Interactive Patient Education [employer].

## 2015-12-04 NOTE — ED Provider Notes (Signed)
CSN: [id number]     Arrival date & time 12/04/15  1134 History  By signing my name below, I, Sonum Patel, attest that this documentation has been prepared under the direction and in the presence of Ezequiel Essex, MD. Electronically Signed: Sonum Patel, Scribe. 12/04/2015. 11:56 AM.    Chief Complaint  Patient presents with  . Flank Pain    The history is provided by the patient. No language interpreter was used.     HPI Comments: Bruce Fisher is a 55 y.o. male with past medical history of HTN, HLD, kidney stones who presents to the Emergency Department complaining of 2 hours of sudden onset, constant, left flank pain with radiation to the left abdomen. He reports associated 1 episode of vomiting and testicular pain. He reports similar symptoms with a prior episode of kidney stones years ago. He has taken Tylenol without relief. He denies dysuria, hematuria, fever, CP, SOB.   Past Medical History  Diagnosis Date  . Hyperlipidemia   . Hypertension   . Rectal spasm   . Renal disorder     kidney stones   Past Surgical History  Procedure Laterality Date  . Spine surgery      lower back   Family History  Problem Relation Age of Onset  . Diabetes Mother   . Cancer Sister     breast   Social History  Substance Use Topics  . Smoking status: Former Smoker    Types: Cigarettes    Quit date: 08/19/1993  . Smokeless tobacco: Never Used  . Alcohol Use: No    Review of Systems  A complete 10 system review of systems was obtained and all systems are negative except as noted in the HPI and PMH.    Allergies  Review of patient's allergies indicates no known allergies.  Home Medications   Prior to Admission medications   Medication Sig Start Date End Date Taking? Authorizing Provider  aspirin EC 81 MG tablet Take 81 mg by mouth daily.    Historical Provider, MD  benazepril (LOTENSIN) 40 MG tablet Take 1 tablet (40 mg total) by mouth daily. 09/16/15   [name], MD   pantoprazole (PROTONIX) 40 MG tablet Take 1 tablet (40 mg total) by mouth daily. For stomach 05/19/15   [name], MD  pravastatin (PRAVACHOL) 40 MG tablet Take 1 tablet (40 mg total) by mouth daily. 09/16/15   [name], MD  tadalafil (CIALIS) 20 MG tablet Take 0.5-1 tablets (10-20 mg total) by mouth every other day as needed for erectile dysfunction. 05/26/15   [name], MD  triamterene-hydrochlorothiazide (MAXZIDE-25) 37.5-25 MG tablet Take 1 tablet by mouth daily. 09/16/15   [name], MD   BP 109/52 mmHg  Pulse 60  Temp(Src) 98 F (36.7 C) (Oral)  Resp 18  Ht 6\' 2"  (1.88 m)  Wt 263 lb (119.296 kg)  BMI 33.75 kg/m2  SpO2 99% Physical Exam  Constitutional: He is oriented to person, place, and time. He appears well-developed and well-nourished. No distress.  Uncomfortable   HENT:  Head: Normocephalic and atraumatic.  Mouth/Throat: Oropharynx is clear and moist. No oropharyngeal exudate.  Eyes: Conjunctivae and EOM are normal. Pupils are equal, round, and reactive to light.  Neck: Normal range of motion. Neck supple.  No meningismus.  Cardiovascular: Normal rate, regular rhythm, normal heart sounds and intact distal pulses.   No murmur heard. Pulmonary/Chest: Effort normal and breath sounds normal. No respiratory distress.  Abdominal: Soft. There is tenderness. There is CVA  tenderness (left). There is no rebound and no guarding.  Mild left abdominal tenderness  Reducible umbilical hernia, nontender  Genitourinary:  No testicular tenderness   Musculoskeletal: Normal range of motion. He exhibits no edema or tenderness.  Neurological: He is alert and oriented to person, place, and time. No cranial nerve deficit. He exhibits normal muscle tone. Coordination normal.  No ataxia on finger to nose bilaterally. No pronator drift. 5/5 strength throughout. CN 2-12 intact.Equal grip strength. Sensation intact.   Skin: Skin is warm.  Psychiatric: He has a normal mood and  affect. His behavior is normal.  Nursing note and vitals reviewed.   ED Course  Procedures (including critical care time)  DIAGNOSTIC STUDIES: Oxygen Saturation is 98% on RA, normal by my interpretation.    COORDINATION OF CARE: 12:02 PM Will order labs and imaging. Will give pain medication and anti-emetic. Discussed treatment plan with pt at bedside and pt agreed to plan.   Labs Review Labs Reviewed  URINALYSIS, ROUTINE W REFLEX MICROSCOPIC (NOT AT [facility]) - Abnormal; Notable for the following:    Hgb urine dipstick LARGE (*)    All other components within normal limits  BASIC METABOLIC PANEL - Abnormal; Notable for the following:    Glucose, Bld 176 (*)    All other components within normal limits  CBC - Abnormal; Notable for the following:    WBC 13.7 (*)    All other components within normal limits  URINE MICROSCOPIC-ADD ON - Abnormal; Notable for the following:    Bacteria, UA MANY (*)    All other components within normal limits  URINE CULTURE    Imaging Review Ct Renal Stone Study  12/04/2015  CLINICAL DATA:  Acute onset left flank pain this morning. Nephrolithiasis. EXAM: CT ABDOMEN AND PELVIS WITHOUT CONTRAST TECHNIQUE: Multidetector CT imaging of the abdomen and pelvis was performed following the standard protocol without IV contrast. COMPARISON:  None. FINDINGS: Lower chest:  No acute findings. Hepatobiliary: No mass visualized on this un-enhanced exam. Gallbladder is unremarkable. Pancreas: No mass or inflammatory process identified on this un-enhanced exam. Spleen: Within normal limits in size. Adrenals/Urinary Tract: Adrenal glands are unremarkable. Mild left hydronephrosis and ureterectasis is seen. A 2 mm calculus is seen in the distal left ureter near the ureterovesical junction. A 3 mm nonobstructive calculus is seen in the lower pole of the right kidney. No evidence of right-sided ureteral calculi or hydronephrosis. No bladder calculi identified. Stomach/Bowel: No  evidence of obstruction, inflammatory process, or abnormal fluid collections. Mild diverticulosis is seen involving the descending colon, however there is no evidence of diverticulitis. Normal appendix visualized. Vascular/Lymphatic: No pathologically enlarged lymph nodes. No evidence of abdominal aortic aneurysm. Reproductive: No mass or other significant abnormality. Other: A small periumbilical abdominal wall hernia seen containing only fat. Musculoskeletal:  No suspicious bone lesions identified. IMPRESSION: 2 mm distal left ureteral calculus causing mild left hydronephrosis. 3 mm nonobstructive right renal calculus. Colonic diverticulosis. No radiographic evidence of diverticulitis. Small fat containing periumbilical hernia. Electronically Signed   By: Earle Gell M.D.   On: 12/04/2015 13:35   I have personally reviewed and evaluated these images and lab results as part of my medical decision-making.   EKG Interpretation None      MDM   Final diagnoses:  Flank pain  Ureteral stone  Urinary tract infection without hematuria, site unspecified  L flank pain with nausea for the past 2 hours.  History of kidney stone.  IVF, pain and nausea control.  UA with hematuria, bacteria, WBCs.  CT with 2 mm distal L ureteral stone. Appears to have infected ureteral stone.  D/w Dr. Alinda Money urology. Patient is well-appearing. Afebrile. Leukocytosis of 13.7. Patient is otherwise healthy and not immunosuppressed. Dr. Alinda Money feels comfortable sending patient home with antibiotics and return precautions if he becomes febrile of if his pain is intractable. He'll need urology follow-up next week.  Rocephin given in the ED.  Urine culture sent.  Pain well controlled. Patient tolerating PO and requesting discharged.  He is advised of his elevated random blood sugar.  Will discharge with pain control, keflex.  Return to the ED with worsening pain, fever, vomiting or any other concerns.   I personally performed  the services described in this documentation, which was scribed in my presence. The recorded information has been reviewed and is accurate.   Ezequiel Essex, MD 12/04/15 [phone]

## 2015-12-07 LAB — URINE CULTURE

## 2015-12-17 ENCOUNTER — Telehealth: Payer: Self-pay | Admitting: Family Medicine

## 2015-12-17 MED ORDER — PRAVASTATIN SODIUM 40 MG PO TABS: 40.0000 mg | ORAL_TABLET | Freq: Every day | ORAL | Status: DC

## 2015-12-17 MED ORDER — TRIAMTERENE-HCTZ 37.5-25 MG PO TABS: 1.0000 | ORAL_TABLET | Freq: Every day | ORAL | Status: DC

## 2015-12-17 MED ORDER — BENAZEPRIL HCL 40 MG PO TABS: 40.0000 mg | ORAL_TABLET | Freq: Every day | ORAL | Status: DC

## 2015-12-17 NOTE — Telephone Encounter (Signed)
done

## 2015-12-22 ENCOUNTER — Ambulatory Visit: Payer: Self-pay | Admitting: Family Medicine

## 2015-12-23 ENCOUNTER — Encounter: Payer: Self-pay | Admitting: Family Medicine

## 2016-03-21 ENCOUNTER — Encounter: Payer: Self-pay | Admitting: Family Medicine

## 2016-03-21 ENCOUNTER — Ambulatory Visit (INDEPENDENT_AMBULATORY_CARE_PROVIDER_SITE_OTHER): Payer: Self-pay | Admitting: Family Medicine

## 2016-03-21 VITALS — BP 147/84 | Temp 98.8°F | Ht 74.0 in | Wt 264.0 lb

## 2016-03-21 DIAGNOSIS — I10 Essential (primary) hypertension: Secondary | ICD-10-CM

## 2016-03-21 DIAGNOSIS — E785 Hyperlipidemia, unspecified: Secondary | ICD-10-CM

## 2016-03-21 MED ORDER — PRAVASTATIN SODIUM 40 MG PO TABS
40.0000 mg | ORAL_TABLET | Freq: Every day | ORAL | 3 refills | Status: DC
Start: 2016-03-21 — End: 2017-04-03

## 2016-03-21 MED ORDER — TRIAMTERENE-HCTZ 37.5-25 MG PO TABS: 1.0000 | ORAL_TABLET | Freq: Every day | ORAL | 3 refills | Status: DC

## 2016-03-21 MED ORDER — BENAZEPRIL HCL 40 MG PO TABS: 40.0000 mg | ORAL_TABLET | Freq: Every day | ORAL | 3 refills | Status: DC

## 2016-03-21 MED ORDER — PANTOPRAZOLE SODIUM 40 MG PO TBEC: 40.0000 mg | DELAYED_RELEASE_TABLET | Freq: Every day | ORAL | 3 refills | Status: DC | PRN

## 2016-03-21 NOTE — Progress Notes (Signed)
BP (!) 147/84   Temp 98.8 F (37.1 C) (Oral)   Ht '6\' 2"'  (1.88 m)   Wt 264 lb (119.7 kg)   BMI 33.90 kg/m    Subjective:    Patient ID: Bruce Fisher, male    DOB: 07/29/1960, 55 y.o.   MRN: [id number]  HPI: Bruce Fisher is a 55 y.o. male presenting on 03/21/2016 for Medication Refill and Labwork (patient is fasting)   HPI Hypertension recheck Patient's blood pressure today is running in the 147/84 range. We rechecked it a couple times and it is still running around the same levels. Patient is currently on benazepril 40 and Maxide 30 7. 5-25. Patient denies headaches, blurred vision, chest pains, shortness of breath, or weakness. Denies any side effects from medication and is content with current medication.  In the past 6 months patient had a calcium CT scan which gave him a calcium score of 0 but also showed a small lung mass and they recommended follow-up CT scan in 6 months to year. It is been about that time but patient has some things to follow up on financially so we'll get it with next visit. Patient does not currently have adequate insurance.  Hyperlipidemia recheck Patient is coming in for cholesterol recheck. He is currently on pravastatin 40 mg. He denies any myalgias or issues with that. He denies any liver issues that he knows of. We will recheck his liver levels today. We will also recheck his lipid levels today. Patient does not currently have insurance so he will come back and his labs when he has sufficient money to.  Relevant past medical, surgical, family and social history reviewed and updated as indicated. Interim medical history since our last visit reviewed. Allergies and medications reviewed and updated.  Review of Systems  Constitutional: Negative for chills and fever.  Eyes: Negative for discharge.  Respiratory: Negative for chest tightness, shortness of breath and wheezing.   Cardiovascular: Negative for chest pain and leg swelling.  Musculoskeletal:  Negative for back pain and gait problem.  Skin: Negative for rash.  Neurological: Negative for dizziness and light-headedness.  All other systems reviewed and are negative.  Per HPI unless specifically indicated above    Medication List       Accurate as of 03/21/16  8:44 AM. Always use your most recent med list.          aspirin EC 81 MG tablet Take 81 mg by mouth daily.   benazepril 40 MG tablet Commonly known as:  LOTENSIN Take 1 tablet (40 mg total) by mouth daily.   multivitamin with minerals Tabs tablet Take 1 tablet by mouth daily.   pantoprazole 40 MG tablet Commonly known as:  PROTONIX Take 1 tablet (40 mg total) by mouth daily as needed (acid reflux). For stomach   pravastatin 40 MG tablet Commonly known as:  PRAVACHOL Take 1 tablet (40 mg total) by mouth daily.   tadalafil 20 MG tablet Commonly known as:  CIALIS Take 0.5-1 tablets (10-20 mg total) by mouth every other day as needed for erectile dysfunction.   triamterene-hydrochlorothiazide 37.5-25 MG tablet Commonly known as:  MAXZIDE-25 Take 1 tablet by mouth daily.   VITAMIN D PO Take 1 tablet by mouth daily.          Objective:    BP (!) 147/84   Temp 98.8 F (37.1 C) (Oral)   Ht '6\' 2"'  (1.88 m)   Wt 264 lb (119.7 kg)   BMI 33.90 kg/m  Wt Readings from Last 3 Encounters:  03/21/16 264 lb (119.7 kg)  12/04/15 263 lb (119.3 kg)  05/19/15 263 lb (119.3 kg)    Physical Exam  Constitutional: He is oriented to person, place, and time. He appears well-developed and well-nourished. No distress.  Eyes: Conjunctivae are normal. Right eye exhibits no discharge. No scleral icterus.  Neck: Neck supple. No thyromegaly present.  Cardiovascular: Normal rate, regular rhythm, normal heart sounds and intact distal pulses.   No murmur heard. Pulmonary/Chest: Effort normal and breath sounds normal. No respiratory distress. He has no wheezes. He has no rales.  Musculoskeletal: Normal range of motion. He  exhibits no edema.  Lymphadenopathy:    He has no cervical adenopathy.  Neurological: He is alert and oriented to person, place, and time. Coordination normal.  Skin: Skin is warm and dry. No rash noted. He is not diaphoretic.  Psychiatric: He has a normal mood and affect. His behavior is normal.  Nursing note and vitals reviewed.     Assessment & Plan:   Problem List Items Addressed This Visit      Cardiovascular and Mediastinum   Hypertension - Primary    BP elevated today, patient will keep a close eye and over the next month and call me back with some numbers of how his blood pressure is running. Continue current medication for now.      Relevant Medications   benazepril (LOTENSIN) 40 MG tablet   pravastatin (PRAVACHOL) 40 MG tablet   triamterene-hydrochlorothiazide (MAXZIDE-25) 37.5-25 MG tablet   Other Relevant Orders   CMP14+EGFR     Other   Hyperlipidemia    Continue current medication and recheck labs today.      Relevant Medications   benazepril (LOTENSIN) 40 MG tablet   pravastatin (PRAVACHOL) 40 MG tablet   triamterene-hydrochlorothiazide (MAXZIDE-25) 37.5-25 MG tablet   Other Relevant Orders   Lipid panel    Other Visit Diagnoses   None.      Follow up plan: Return in about 6 months (around 09/21/2016), or if symptoms worsen or fail to improve, for Recheck hypertension and cholesterol.  Counseling provided for all of the vaccine components Orders Placed This Encounter  Procedures  . CMP14+EGFR  . Lipid panel    Caryl Pina, MD [facility] 03/21/2016, 8:44 AM

## 2016-03-21 NOTE — Assessment & Plan Note (Signed)
Continue current medication and recheck labs today.

## 2016-03-21 NOTE — Assessment & Plan Note (Signed)
BP elevated today, patient will keep a close eye and over the next month and call me back with some numbers of how his blood pressure is running. Continue current medication for now.

## 2016-04-29 ENCOUNTER — Other Ambulatory Visit: Payer: Self-pay | Admitting: Family Medicine

## 2016-04-29 ENCOUNTER — Telehealth: Payer: Self-pay | Admitting: Family Medicine

## 2016-04-29 MED ORDER — SILDENAFIL CITRATE 20 MG PO TABS: 20.0000 mg | ORAL_TABLET | Freq: Three times a day (TID) | ORAL | 0 refills | Status: DC

## 2016-04-29 NOTE — Telephone Encounter (Signed)
Patient aware.

## 2016-04-29 NOTE — Telephone Encounter (Signed)
I do not see on patients medication list 

## 2016-04-29 NOTE — Telephone Encounter (Signed)
Go ahead and send him a new prescription for sildenafil 20 mg and give him 10 with 2 refills

## 2016-09-27 ENCOUNTER — Encounter: Payer: Self-pay | Admitting: Family Medicine

## 2016-09-27 ENCOUNTER — Ambulatory Visit (INDEPENDENT_AMBULATORY_CARE_PROVIDER_SITE_OTHER): Payer: BLUE CROSS/BLUE SHIELD | Admitting: Family Medicine

## 2016-09-27 VITALS — BP 118/75 | HR 100 | Temp 101.4°F | Ht 74.0 in | Wt 248.0 lb

## 2016-09-27 DIAGNOSIS — J111 Influenza due to unidentified influenza virus with other respiratory manifestations: Secondary | ICD-10-CM | POA: Diagnosis not present

## 2016-09-27 DIAGNOSIS — I10 Essential (primary) hypertension: Secondary | ICD-10-CM | POA: Diagnosis not present

## 2016-09-27 DIAGNOSIS — R0981 Nasal congestion: Secondary | ICD-10-CM

## 2016-09-27 DIAGNOSIS — E782 Mixed hyperlipidemia: Secondary | ICD-10-CM

## 2016-09-27 MED ORDER — BETAMETHASONE SOD PHOS & ACET 6 (3-3) MG/ML IJ SUSP
6.0000 mg | Freq: Once | INTRAMUSCULAR | Status: AC
  Administered 2016-09-27: 6 mg via INTRAMUSCULAR

## 2016-09-27 MED ORDER — OSELTAMIVIR PHOSPHATE 75 MG PO CAPS: 75.0000 mg | ORAL_CAPSULE | Freq: Two times a day (BID) | ORAL | 0 refills | Status: DC

## 2016-09-27 NOTE — Progress Notes (Signed)
Subjective:  Patient ID: Bruce Fisher, male    DOB: 01-10-1961  Age: 56 y.o. MRN: [id number]  CC: Hypertension (pt here today for routine follow up on his HTN but is also c/o flu like symptoms. His grandson that lives with him was dx'd with the flu 2 days ago.)   HPI Bruce Fisher presents for  follow-up of hypertension. Patient has no history of headache chest pain or shortness of breath or recent cough. Patient also denies symptoms of TIA such as numbness weakness lateralizing. Patient Does not check blood pressure  Patient denies side effects from medication. States taking it regularly.  Patient also  in for follow-up of elevated cholesterol. Doing well without complaints on current medication. Denies side effects of statin including myalgia and arthralgia and nausea. Also in today for liver function testing. Currently no chest pain, shortness of breath or other cardiovascular related symptoms noted.   Patient presents with dry cough runny stuffy nose. Diffuse headache of moderate intensity. Patient also has chills and subjective fever. Body aches worst in the back but present in the legs, shoulders, and torso as well. Has sapped the energy to the point that of being unable to perform usual activities other than ADLs. Onset 2 days ago.   History Bruce Fisher has a past medical history of Hyperlipidemia; Hypertension; Rectal spasm; and Renal disorder.   He has a past surgical history that includes Spine surgery.   His family history includes Cancer in his sister; Diabetes in his mother.He reports that he quit smoking about 23 years ago. His smoking use included Cigarettes. He has never used smokeless tobacco. He reports that he does not drink alcohol or use drugs.  Current Outpatient Prescriptions on File Prior to Visit  Medication Sig Dispense Refill  . aspirin EC 81 MG tablet Take 81 mg by mouth daily.    . benazepril (LOTENSIN) 40 MG tablet Take 1 tablet (40 mg total) by mouth daily. 90  tablet 3  . Cholecalciferol (VITAMIN D PO) Take 1 tablet by mouth daily.    . Multiple Vitamin (MULTIVITAMIN WITH MINERALS) TABS tablet Take 1 tablet by mouth daily.    . pantoprazole (PROTONIX) 40 MG tablet Take 1 tablet (40 mg total) by mouth daily as needed (acid reflux). For stomach 90 tablet 3  . pravastatin (PRAVACHOL) 40 MG tablet Take 1 tablet (40 mg total) by mouth daily. 90 tablet 3  . sildenafil (REVATIO) 20 MG tablet TAKE 40 MG TO 100 MG BY MOUTH AS NEEDED 15 tablet 2  . triamterene-hydrochlorothiazide (MAXZIDE-25) 37.5-25 MG tablet Take 1 tablet by mouth daily. 90 tablet 3   No current facility-administered medications on file prior to visit.     ROS Review of Systems  Constitutional: Positive for chills, diaphoresis, fatigue and fever. Negative for unexpected weight change.  HENT: Positive for congestion, postnasal drip, rhinorrhea, sinus pressure and sneezing. Negative for hearing loss and sore throat.   Eyes: Negative for visual disturbance.  Respiratory: Negative for cough and shortness of breath.   Cardiovascular: Negative for chest pain.  Gastrointestinal: Negative for abdominal pain, constipation and diarrhea.  Genitourinary: Negative for dysuria and flank pain.  Musculoskeletal: Positive for myalgias. Negative for arthralgias and joint swelling.  Skin: Negative for rash.  Neurological: Positive for headaches. Negative for dizziness.  Psychiatric/Behavioral: Negative for dysphoric mood and sleep disturbance.    Objective:  BP 118/75   Pulse 100   Temp (!) 101.4 F (38.6 C) (Oral)   Ht _0  (1.88  m)   Wt 248 lb (112.5 kg)   BMI 31.84 kg/m   BP Readings from Last 3 Encounters:  09/27/16 118/75  03/21/16 (!) 147/84  12/04/15 114/58    Wt Readings from Last 3 Encounters:  09/27/16 248 lb (112.5 kg)  03/21/16 264 lb (119.7 kg)  12/04/15 263 lb (119.3 kg)     Physical Exam  Constitutional: He is oriented to person, place, and time. He appears  well-developed and well-nourished. No distress.  HENT:  Head: Normocephalic and atraumatic.  Right Ear: Tympanic membrane and external ear normal. No decreased hearing is noted.  Left Ear: Tympanic membrane and external ear normal. No decreased hearing is noted.  Nose: Mucosal edema present. Right sinus exhibits no frontal sinus tenderness. Left sinus exhibits no frontal sinus tenderness.  Mouth/Throat: Oropharynx is clear and moist. No oropharyngeal exudate or posterior oropharyngeal erythema.  Eyes: Conjunctivae and EOM are normal. Pupils are equal, round, and reactive to light.  Neck: Normal range of motion. Neck supple. No Brudzinski's sign noted. No thyromegaly present.  Cardiovascular: Normal rate, regular rhythm and normal heart sounds.   No murmur heard. Pulmonary/Chest: Effort normal and breath sounds normal. No respiratory distress. He has no wheezes. He has no rales.  Abdominal: Soft. Bowel sounds are normal. He exhibits no distension. There is no tenderness.  Lymphadenopathy:       Head (right side): No preauricular adenopathy present.       Head (left side): No preauricular adenopathy present.    He has no cervical adenopathy.       Right cervical: No superficial cervical adenopathy present.      Left cervical: No superficial cervical adenopathy present.  Neurological: He is alert and oriented to person, place, and time. He has normal reflexes.  Skin: Skin is warm and dry.  Psychiatric: He has a normal mood and affect. His behavior is normal. Judgment and thought content normal.    No components found for: BAYER DCA HB A1C WAIVED    Assessment & Plan:   [name] was seen today for hypertension.  Diagnoses and all orders for this visit:  Influenza with respiratory manifestation -     CBC with Differential/Platelet  Mixed hyperlipidemia -     CBC with Differential/Platelet -     CMP14+EGFR -     Lipid panel  Essential hypertension -     CBC with  Differential/Platelet -     CMP14+EGFR  Sinus congestion -     betamethasone acetate-betamethasone sodium phosphate (CELESTONE) injection 6 mg; Inject 1 mL (6 mg total) into the muscle once. -     CBC with Differential/Platelet  Other orders -     oseltamivir (TAMIFLU) 75 MG capsule; Take 1 capsule (75 mg total) by mouth 2 (two) times daily.   I have discontinued Bruce Fisher's tadalafil. I am also having him start on oseltamivir. Additionally, I am having him maintain his aspirin EC, multivitamin with minerals, Cholecalciferol (VITAMIN D PO), benazepril, pravastatin, triamterene-hydrochlorothiazide, pantoprazole, and sildenafil. We will continue to administer betamethasone acetate-betamethasone sodium phosphate.  Meds ordered this encounter  Medications  . betamethasone acetate-betamethasone sodium phosphate (CELESTONE) injection 6 mg  . oseltamivir (TAMIFLU) 75 MG capsule    Sig: Take 1 capsule (75 mg total) by mouth 2 (two) times daily.    Dispense:  10 capsule    Refill:  0     Follow-up: Return in about 6 months (around 03/30/2017), or if symptoms worsen or fail to improve.  Cletus Gash  Cordaryl Decelles, M.D.

## 2016-09-27 NOTE — Patient Instructions (Signed)
Rest at home for the next 2 days.  The shot you were given today has cortisone in it. It should relieve congestion and body aches. You can also supplement that with NyQuil at bedtime and take well through the day.  Tamiflu is the antibiotic that should kill the germs and heal your body.

## 2016-09-28 LAB — CBC WITH DIFFERENTIAL/PLATELET
BASOS: 0 %
Basophils Absolute: 0 10*3/uL (ref 0.0–0.2)
EOS (ABSOLUTE): 0 10*3/uL (ref 0.0–0.4)
Eos: 0 %
Hematocrit: 44.1 % (ref 37.5–51.0)
Hemoglobin: 15.2 g/dL (ref 13.0–17.7)
Immature Grans (Abs): 0 10*3/uL (ref 0.0–0.1)
Immature Granulocytes: 0 %
LYMPHS ABS: 1 10*3/uL (ref 0.7–3.1)
Lymphs: 11 %
MCH: 29.9 pg (ref 26.6–33.0)
MCHC: 34.5 g/dL (ref 31.5–35.7)
MCV: 87 fL (ref 79–97)
MONOS ABS: 0.7 10*3/uL (ref 0.1–0.9)
Monocytes: 7 %
NEUTROS ABS: 7.4 10*3/uL — AB (ref 1.4–7.0)
NEUTROS PCT: 82 %
PLATELETS: 164 10*3/uL (ref 150–379)
RBC: 5.09 x10E6/uL (ref 4.14–5.80)
RDW: 14.2 % (ref 12.3–15.4)
WBC: 9.1 10*3/uL (ref 3.4–10.8)

## 2016-09-28 LAB — CMP14+EGFR
A/G RATIO: 1.2 (ref 1.2–2.2)
ALT: 28 IU/L (ref 0–44)
AST: 22 IU/L (ref 0–40)
Albumin: 4.6 g/dL (ref 3.5–5.5)
Alkaline Phosphatase: 60 IU/L (ref 39–117)
BILIRUBIN TOTAL: 0.4 mg/dL (ref 0.0–1.2)
BUN/Creatinine Ratio: 13 (ref 9–20)
BUN: 15 mg/dL (ref 6–24)
CO2: 26 mmol/L (ref 18–29)
Calcium: 9.3 mg/dL (ref 8.7–10.2)
Chloride: 97 mmol/L (ref 96–106)
Creatinine, Ser: 1.19 mg/dL (ref 0.76–1.27)
GFR calc non Af Amer: 69 mL/min/{1.73_m2} (ref >59)
GFR, EST AFRICAN AMERICAN: 80 mL/min/{1.73_m2} (ref >59)
Globulin, Total: 3.9 g/dL (ref 1.5–4.5)
Glucose: 108 mg/dL — ABNORMAL HIGH (ref 65–99)
POTASSIUM: 3.9 mmol/L (ref 3.5–5.2)
SODIUM: 139 mmol/L (ref 134–144)
Total Protein: 8.5 g/dL (ref 6.0–8.5)

## 2016-09-28 LAB — LIPID PANEL
CHOL/HDL RATIO: 3.8 ratio (ref 0.0–5.0)
CHOLESTEROL TOTAL: 163 mg/dL (ref 100–199)
HDL: 43 mg/dL (ref >39)
LDL CALC: 101 mg/dL — AB (ref 0–99)
Triglycerides: 96 mg/dL (ref 0–149)
VLDL CHOLESTEROL CAL: 19 mg/dL (ref 5–40)

## 2016-10-11 ENCOUNTER — Telehealth: Payer: Self-pay

## 2016-10-19 NOTE — Telephone Encounter (Signed)
x

## 2016-12-26 ENCOUNTER — Telehealth: Payer: Self-pay | Admitting: Family Medicine

## 2016-12-26 NOTE — Telephone Encounter (Signed)
Pt had questions about when he had his Vitamin D checked last. Advised we checked last in August of 2016. Pt voiced understanding.

## 2016-12-27 ENCOUNTER — Emergency Department (HOSPITAL_COMMUNITY): Payer: BLUE CROSS/BLUE SHIELD

## 2016-12-27 ENCOUNTER — Encounter (HOSPITAL_COMMUNITY): Payer: Self-pay

## 2016-12-27 ENCOUNTER — Emergency Department (HOSPITAL_COMMUNITY)
Admission: EM | Admit: 2016-12-27 | Discharge: 2016-12-27 | Disposition: A | Discharge status: Home / Self Care | Payer: BLUE CROSS/BLUE SHIELD | Attending: Emergency Medicine | Admitting: Emergency Medicine

## 2016-12-27 ENCOUNTER — Ambulatory Visit: Payer: BLUE CROSS/BLUE SHIELD | Admitting: Family Medicine

## 2016-12-27 DIAGNOSIS — Z79899 Other long term (current) drug therapy: Secondary | ICD-10-CM | POA: Insufficient documentation

## 2016-12-27 DIAGNOSIS — Z87891 Personal history of nicotine dependence: Secondary | ICD-10-CM | POA: Diagnosis not present

## 2016-12-27 DIAGNOSIS — I10 Essential (primary) hypertension: Secondary | ICD-10-CM | POA: Insufficient documentation

## 2016-12-27 DIAGNOSIS — R509 Fever, unspecified: Secondary | ICD-10-CM | POA: Diagnosis not present

## 2016-12-27 DIAGNOSIS — R5081 Fever presenting with conditions classified elsewhere: Secondary | ICD-10-CM

## 2016-12-27 DIAGNOSIS — Z7982 Long term (current) use of aspirin: Secondary | ICD-10-CM | POA: Diagnosis not present

## 2016-12-27 DIAGNOSIS — B999 Unspecified infectious disease: Secondary | ICD-10-CM

## 2016-12-27 DIAGNOSIS — R51 Headache: Secondary | ICD-10-CM | POA: Diagnosis present

## 2016-12-27 LAB — COMPREHENSIVE METABOLIC PANEL
ALK PHOS: 55 U/L (ref 38–126)
ALT: 25 U/L (ref 17–63)
AST: 21 U/L (ref 15–41)
Albumin: 4.1 g/dL (ref 3.5–5.0)
Anion gap: 10 (ref 5–15)
BILIRUBIN TOTAL: 0.9 mg/dL (ref 0.3–1.2)
BUN: 17 mg/dL (ref 6–20)
CALCIUM: 9.1 mg/dL (ref 8.9–10.3)
CO2: 26 mmol/L (ref 22–32)
CREATININE: 1.13 mg/dL (ref 0.61–1.24)
Chloride: 97 mmol/L — ABNORMAL LOW (ref 101–111)
GFR calc Af Amer: >60 mL/min (ref >60)
Glucose, Bld: 99 mg/dL (ref 65–99)
Potassium: 3.8 mmol/L (ref 3.5–5.1)
Sodium: 133 mmol/L — ABNORMAL LOW (ref 135–145)
TOTAL PROTEIN: 9 g/dL — AB (ref 6.5–8.1)

## 2016-12-27 LAB — CBC WITH DIFFERENTIAL/PLATELET
BASOS ABS: 0 10*3/uL (ref 0.0–0.1)
BASOS PCT: 0 %
Eosinophils Absolute: 0 10*3/uL (ref 0.0–0.7)
Eosinophils Relative: 0 %
HEMATOCRIT: 46.2 % (ref 39.0–52.0)
HEMOGLOBIN: 15.4 g/dL (ref 13.0–17.0)
LYMPHS PCT: 15 %
Lymphs Abs: 1.7 10*3/uL (ref 0.7–4.0)
MCH: 29.9 pg (ref 26.0–34.0)
MCHC: 33.3 g/dL (ref 30.0–36.0)
MCV: 89.7 fL (ref 78.0–100.0)
MONO ABS: 1.1 10*3/uL — AB (ref 0.1–1.0)
MONOS PCT: 9 %
NEUTROS ABS: 8.5 10*3/uL — AB (ref 1.7–7.7)
NEUTROS PCT: 76 %
Platelets: 133 10*3/uL — ABNORMAL LOW (ref 150–400)
RBC: 5.15 MIL/uL (ref 4.22–5.81)
RDW: 14.1 % (ref 11.5–15.5)
WBC: 11.3 10*3/uL — ABNORMAL HIGH (ref 4.0–10.5)

## 2016-12-27 MED ORDER — ACETAMINOPHEN 500 MG PO TABS
1000.0000 mg | ORAL_TABLET | Freq: Once | ORAL | Status: AC
  Administered 2016-12-27: 1000 mg via ORAL
  Filled 2016-12-27: qty 2

## 2016-12-27 MED ORDER — DOXYCYCLINE HYCLATE 100 MG PO CAPS: 100.0000 mg | ORAL_CAPSULE | Freq: Two times a day (BID) | ORAL | 0 refills | Status: DC

## 2016-12-27 MED ORDER — KETOROLAC TROMETHAMINE 30 MG/ML IJ SOLN
30.0000 mg | Freq: Once | INTRAMUSCULAR | Status: AC
  Administered 2016-12-27: 30 mg via INTRAVENOUS
  Filled 2016-12-27: qty 1

## 2016-12-27 MED ORDER — IBUPROFEN 800 MG PO TABS: 800.0000 mg | ORAL_TABLET | Freq: Three times a day (TID) | ORAL | 0 refills | Status: DC | PRN

## 2016-12-27 MED ORDER — SODIUM CHLORIDE 0.9 % IV BOLUS (SEPSIS)
1000.0000 mL | Freq: Once | INTRAVENOUS | Status: AC
  Administered 2016-12-27: 1000 mL via INTRAVENOUS

## 2016-12-27 NOTE — ED Triage Notes (Signed)
Pt reports that he has been experiencing joint, muscle and back pain for a year, but has gotten worse since last Thursday. PT reports body aches, head pressure , with chills last night. Woke up sweating.

## 2016-12-27 NOTE — Discharge Instructions (Signed)
Take Tylenol for fever and follow-up with your family doctor next week for recheck.

## 2016-12-27 NOTE — ED Provider Notes (Signed)
[facility] DEPT Provider Note   CSN: [id number] Arrival date & time: 12/27/16  5885     History   Chief Complaint No chief complaint on file.   HPI Bruce Fisher is a 56 y.o. male.  Patient states that for last few days he's been aching and having headache. But also he has had joint aches for over a year and has been seeing his doctor. No diagnosis has been made. Patient does spend a lot of time out in the yard    Headache   This is a new problem. The current episode started more than 2 days ago. The problem occurs constantly. The problem has not changed since onset.The headache is associated with nothing. The pain is located in the temporal region. The quality of the pain is described as dull. The pain is at a severity of 4/10. The pain is moderate. The pain does not radiate. Pertinent negatives include no anorexia.    Past Medical History:  Diagnosis Date  . Hyperlipidemia   . Hypertension   . Rectal spasm   . Renal disorder    kidney stones    Patient Active Problem List   Diagnosis Date Noted  . GERD (gastroesophageal reflux disease) 05/19/2015  . Erectile dysfunction 03/23/2015  . Mixed hyperlipidemia   . Hypertension     Past Surgical History:  Procedure Laterality Date  . SPINE SURGERY     lower back       Home Medications    Prior to Admission medications   Medication Sig Start Date End Date Taking? Authorizing Provider  aspirin EC 81 MG tablet Take 81 mg by mouth daily.   Yes [provider]  benazepril (LOTENSIN) 40 MG tablet Take 1 tablet (40 mg total) by mouth daily. 03/21/16  Yes Dettinger, Fransisca Kaufmann, MD  Cholecalciferol (VITAMIN D PO) Take 1 tablet by mouth daily.   Yes [provider]  Multiple Vitamin (MULTIVITAMIN WITH MINERALS) TABS tablet Take 1 tablet by mouth daily.   Yes [provider]  pantoprazole (PROTONIX) 40 MG tablet Take 1 tablet (40 mg total) by mouth daily as needed (acid reflux). For stomach  03/21/16  Yes Dettinger, Fransisca Kaufmann, MD  pravastatin (PRAVACHOL) 40 MG tablet Take 1 tablet (40 mg total) by mouth daily. 03/21/16  Yes Dettinger, Fransisca Kaufmann, MD  triamterene-hydrochlorothiazide (MAXZIDE-25) 37.5-25 MG tablet Take 1 tablet by mouth daily. 03/21/16  Yes Dettinger, Fransisca Kaufmann, MD  doxycycline (VIBRAMYCIN) 100 MG capsule Take 1 capsule (100 mg total) by mouth 2 (two) times daily. One po bid x 7 days 12/27/16   [name], MD  ibuprofen (ADVIL,MOTRIN) 800 MG tablet Take 1 tablet (800 mg total) by mouth every 8 (eight) hours as needed for moderate pain. 12/27/16   [name], MD  oseltamivir (TAMIFLU) 75 MG capsule Take 1 capsule (75 mg total) by mouth 2 (two) times daily. Patient not taking: Reported on 12/27/2016 09/27/16   [name], MD  sildenafil (REVATIO) 20 MG tablet TAKE 40 MG TO 100 MG BY MOUTH AS NEEDED 05/02/16   [name], MD    Family History Family History  Problem Relation Age of Onset  . Diabetes Mother   . Cancer Sister        breast    Social History Social History  Substance Use Topics  . Smoking status: Former Smoker    Types: Cigarettes    Quit date: 08/19/1993  . Smokeless tobacco: Never Used  . Alcohol use No  Allergies   Patient has no known allergies.   Review of Systems Review of Systems  Constitutional: Negative for appetite change and fatigue.  HENT: Negative for congestion, ear discharge and sinus pressure.   Eyes: Negative for discharge.  Respiratory: Negative for cough.   Cardiovascular: Negative for chest pain.  Gastrointestinal: Negative for abdominal pain, anorexia and diarrhea.  Genitourinary: Negative for frequency and hematuria.  Musculoskeletal: Negative for back pain.  Skin: Negative for rash.  Neurological: Positive for headaches. Negative for seizures.  Psychiatric/Behavioral: Negative for hallucinations.     Physical Exam Updated Vital Signs BP (!) 141/85 (BP Location: Left Arm)   Pulse 80   Temp (!) 101.6 F  (38.7 C) (Oral)   Resp 18   Wt 112.5 kg (248 lb)   SpO2 98%   BMI 31.84 kg/m   Physical Exam  Constitutional: He is oriented to person, place, and time. He appears well-developed.  HENT:  Head: Normocephalic.  Supple neck  Eyes: Conjunctivae and EOM are normal. No scleral icterus.  Neck: Neck supple. No thyromegaly present.  Cardiovascular: Normal rate and regular rhythm.  Exam reveals no gallop and no friction rub.   No murmur heard. Pulmonary/Chest: No stridor. He has no wheezes. He has no rales. He exhibits no tenderness.  Abdominal: He exhibits no distension. There is no tenderness. There is no rebound.  Musculoskeletal: Normal range of motion. He exhibits no edema.  Lymphadenopathy:    He has no cervical adenopathy.  Neurological: He is oriented to person, place, and time. He exhibits normal muscle tone. Coordination normal.  Skin: No rash noted. No erythema.  Psychiatric: He has a normal mood and affect. His behavior is normal.     ED Treatments / Results  Labs (all labs ordered are listed, but only abnormal results are displayed) Labs Reviewed  CBC WITH DIFFERENTIAL/PLATELET - Abnormal; Notable for the following:       Result Value   WBC 11.3 (*)    Platelets 133 (*)    Neutro Abs 8.5 (*)    Monocytes Absolute 1.1 (*)    All other components within normal limits  COMPREHENSIVE METABOLIC PANEL - Abnormal; Notable for the following:    Sodium 133 (*)    Chloride 97 (*)    Total Protein 9.0 (*)    All other components within normal limits  B. BURGDORFI ANTIBODIES  ROCKY MTN SPOTTED FVR ABS PNL(IGG+IGM)    EKG  EKG Interpretation None       Radiology Dg Chest 2 View  Result Date: 12/27/2016 CLINICAL DATA:  Chest pain EXAM: CHEST  2 VIEW COMPARISON:  12/29/2014 FINDINGS: Heart and mediastinal contours are within normal limits. No focal opacities or effusions. No acute bony abnormality. IMPRESSION: No active cardiopulmonary disease. Electronically Signed    By: Rolm Baptise M.D.   On: 12/27/2016 11:39    Procedures Procedures (including critical care time)  Medications Ordered in ED Medications  sodium chloride 0.9 % bolus 1,000 mL (1,000 mLs Intravenous New Bag/Given 12/27/16 1052)  ketorolac (TORADOL) 30 MG/ML injection 30 mg (30 mg Intravenous Given 12/27/16 1051)  acetaminophen (TYLENOL) tablet 1,000 mg (1,000 mg Oral Given 12/27/16 1104)     Initial Impression / Assessment and Plan / ED Course  I have reviewed the triage vital signs and the nursing notes.  Pertinent labs & imaging results that were available during my care of the patient were reviewed by me and considered in my medical decision making (see chart for details).  patient states he feels better with the Toradol fluids. Patient has been on the yard a lot stated he could have some tick exposure. I have sent Lyme's and [facility] spotted fever titers. I am going to empirically place the patient on doxycycline and let him follow-up with his PCP   Final Clinical Impressions(s) / ED Diagnoses   Final diagnoses:  Fever due to infection    New Prescriptions New Prescriptions   DOXYCYCLINE (VIBRAMYCIN) 100 MG CAPSULE    Take 1 capsule (100 mg total) by mouth 2 (two) times daily. One po bid x 7 days   IBUPROFEN (ADVIL,MOTRIN) 800 MG TABLET    Take 1 tablet (800 mg total) by mouth every 8 (eight) hours as needed for moderate pain.     [name], MD 12/27/16 1419

## 2016-12-28 LAB — B. BURGDORFI ANTIBODIES: B burgdorferi Ab IgG+IgM: <0.91 {ISR} (ref 0.00–0.90)

## 2016-12-29 LAB — ROCKY MTN SPOTTED FVR ABS PNL(IGG+IGM)
RMSF IgG: NEGATIVE
RMSF IgM: 0.36 index (ref 0.00–0.89)

## 2017-04-03 ENCOUNTER — Other Ambulatory Visit: Payer: Self-pay | Admitting: Family Medicine

## 2017-04-03 DIAGNOSIS — I10 Essential (primary) hypertension: Secondary | ICD-10-CM

## 2017-04-03 DIAGNOSIS — E785 Hyperlipidemia, unspecified: Secondary | ICD-10-CM

## 2017-06-30 ENCOUNTER — Other Ambulatory Visit: Payer: Self-pay | Admitting: Family Medicine

## 2017-06-30 ENCOUNTER — Telehealth: Payer: Self-pay | Admitting: Family Medicine

## 2017-06-30 DIAGNOSIS — E785 Hyperlipidemia, unspecified: Secondary | ICD-10-CM

## 2017-06-30 DIAGNOSIS — I10 Essential (primary) hypertension: Secondary | ICD-10-CM

## 2017-06-30 NOTE — Telephone Encounter (Signed)
What is the name of the medication? chloesterol and bp and stomach pill(pt not sure of the rx name)  Have you contacted your pharmacy to request a refill? No he just wants to know if he has refill and if not call so that he can make an appt  Which pharmacy would you like this sent to? [facility]   Patient notified that their request is being sent to the clinical staff for review and that they should receive a call once it is complete. If they do not receive a call within 24 hours they can check with their pharmacy or our office.

## 2017-06-30 NOTE — Telephone Encounter (Signed)
Last office visit 09-27-16. Ok to fill?

## 2017-07-01 NOTE — Telephone Encounter (Signed)
Last seen 03/05/16 for follow up

## 2017-07-01 NOTE — Telephone Encounter (Signed)
Last seen 03/21/16 for follow up.

## 2017-07-02 NOTE — Telephone Encounter (Signed)
Authorize 30 days only. Then contact the patient letting them know that they will need an appointment before any further prescriptions can be sent in. 

## 2017-07-04 ENCOUNTER — Other Ambulatory Visit: Payer: Self-pay | Admitting: Family

## 2017-07-04 DIAGNOSIS — E785 Hyperlipidemia, unspecified: Secondary | ICD-10-CM

## 2017-07-04 DIAGNOSIS — I10 Essential (primary) hypertension: Secondary | ICD-10-CM

## 2017-07-04 MED ORDER — PRAVASTATIN SODIUM 40 MG PO TABS: 40.0000 mg | ORAL_TABLET | Freq: Every day | ORAL | 0 refills | Status: DC

## 2017-07-04 MED ORDER — TRIAMTERENE-HCTZ 37.5-25 MG PO TABS: 1.0000 | ORAL_TABLET | Freq: Every day | ORAL | 0 refills | Status: DC

## 2017-07-04 MED ORDER — BENAZEPRIL HCL 40 MG PO TABS: 40.0000 mg | ORAL_TABLET | Freq: Every day | ORAL | 0 refills | Status: DC

## 2017-08-08 ENCOUNTER — Ambulatory Visit: Payer: BLUE CROSS/BLUE SHIELD | Admitting: Family Medicine

## 2017-08-21 ENCOUNTER — Encounter: Payer: Self-pay | Admitting: Family Medicine

## 2017-08-21 ENCOUNTER — Ambulatory Visit: Payer: BLUE CROSS/BLUE SHIELD | Admitting: Family Medicine

## 2017-08-21 VITALS — BP 138/69 | HR 63 | Temp 99.4°F | Ht 74.0 in | Wt 246.0 lb

## 2017-08-21 DIAGNOSIS — I10 Essential (primary) hypertension: Secondary | ICD-10-CM

## 2017-08-21 DIAGNOSIS — E782 Mixed hyperlipidemia: Secondary | ICD-10-CM

## 2017-08-21 DIAGNOSIS — B351 Tinea unguium: Secondary | ICD-10-CM

## 2017-08-21 DIAGNOSIS — N529 Male erectile dysfunction, unspecified: Secondary | ICD-10-CM | POA: Diagnosis not present

## 2017-08-21 DIAGNOSIS — N400 Enlarged prostate without lower urinary tract symptoms: Secondary | ICD-10-CM | POA: Diagnosis not present

## 2017-08-21 DIAGNOSIS — E785 Hyperlipidemia, unspecified: Secondary | ICD-10-CM

## 2017-08-21 MED ORDER — TRIAMTERENE-HCTZ 37.5-25 MG PO TABS: 1.0000 | ORAL_TABLET | Freq: Every day | ORAL | 1 refills | Status: DC

## 2017-08-21 MED ORDER — PRAVASTATIN SODIUM 40 MG PO TABS: 40.0000 mg | ORAL_TABLET | Freq: Every day | ORAL | 1 refills | Status: DC

## 2017-08-21 MED ORDER — FINASTERIDE 5 MG PO TABS: 5.0000 mg | ORAL_TABLET | Freq: Every day | ORAL | 1 refills | Status: DC

## 2017-08-21 MED ORDER — TAMSULOSIN HCL 0.4 MG PO CAPS: 0.4000 mg | ORAL_CAPSULE | Freq: Every day | ORAL | 1 refills | Status: DC

## 2017-08-21 MED ORDER — TERBINAFINE HCL 250 MG PO TABS: 250.0000 mg | ORAL_TABLET | Freq: Every day | ORAL | 0 refills | Status: DC

## 2017-08-21 MED ORDER — BENAZEPRIL HCL 40 MG PO TABS: 40.0000 mg | ORAL_TABLET | Freq: Every day | ORAL | 1 refills | Status: DC

## 2017-08-21 MED ORDER — PANTOPRAZOLE SODIUM 40 MG PO TBEC: DELAYED_RELEASE_TABLET | ORAL | 1 refills | Status: DC

## 2017-08-21 MED ORDER — SILDENAFIL CITRATE 20 MG PO TABS: ORAL_TABLET | ORAL | 2 refills | Status: DC

## 2017-08-21 NOTE — Progress Notes (Signed)
Subjective:  Patient ID: Bruce Fisher,  male    DOB: 1960-10-16  Age: 57 y.o.    CC: Hypertension (pt here today for routine follow up of his chronic medical conditions)   HPI Bruce Fisher presents for  follow-up of hypertension. Patient has no history of headache chest pain or shortness of breath or recent cough. Patient also denies symptoms of TIA such as numbness weakness lateralizing.  Patient denies side effects from medication. States taking it regularly.  Patient also  in for follow-up of elevated cholesterol. Doing well without complaints on current medication. Denies side effects of statin including myalgia and arthralgia and nausea. Also in today for liver function testing. Currently no chest pain, shortness of breath or other cardiovascular related symptoms noted.  Patient also reports that he is having some dribbling with urination.  His erections are not as firm as he did like them.  When he uses the sildenafil if he uses more than 3 at a time he is prone to severe headache.  He says that if he takes more than 3 it takes too long for him to get an erection.  He is very concerned about prostate problems because he has 2 brothers who have had prostate cancer.  History Bruce Fisher has a past medical history of Hyperlipidemia, Hypertension, Rectal spasm, and Renal disorder.   He has a past surgical history that includes Spine surgery.   His family history includes Cancer in his sister; Diabetes in his mother.He reports that he quit smoking about 24 years ago. His smoking use included cigarettes. he has never used smokeless tobacco. He reports that he does not drink alcohol or use drugs.  Current Outpatient Medications on File Prior to Visit  Medication Sig Dispense Refill  . aspirin EC 81 MG tablet Take 81 mg by mouth daily.    . Cholecalciferol (VITAMIN D PO) Take 1 tablet by mouth daily.    Marland Kitchen ibuprofen (ADVIL,MOTRIN) 800 MG tablet Take 1 tablet (800 mg total) by mouth every 8  (eight) hours as needed for moderate pain. 21 tablet 0  . Multiple Vitamin (MULTIVITAMIN WITH MINERALS) TABS tablet Take 1 tablet by mouth daily.     No current facility-administered medications on file prior to visit.     ROS Review of Systems  Constitutional: Negative for chills, diaphoresis, fever and unexpected weight change.  HENT: Negative for congestion, hearing loss, rhinorrhea and sore throat.   Eyes: Negative for visual disturbance.  Respiratory: Negative for cough and shortness of breath.   Cardiovascular: Negative for chest pain.  Gastrointestinal: Negative for abdominal pain, constipation and diarrhea.  Genitourinary: Positive for difficulty urinating. Negative for decreased urine volume, dysuria, flank pain, frequency, hematuria and urgency.  Musculoskeletal: Negative for arthralgias and joint swelling.  Skin: Negative for rash.  Neurological: Negative for dizziness and headaches.  Psychiatric/Behavioral: Negative for dysphoric mood and sleep disturbance.    Objective:  BP 138/69   Pulse 63   Temp 99.4 F (37.4 C) (Oral)   Ht _0  (1.88 m)   Wt 246 lb (111.6 kg)   BMI 31.58 kg/m   BP Readings from Last 3 Encounters:  08/21/17 138/69  12/27/16 132/79  09/27/16 118/75    Wt Readings from Last 3 Encounters:  08/21/17 246 lb (111.6 kg)  12/27/16 248 lb (112.5 kg)  09/27/16 248 lb (112.5 kg)     Physical Exam  Constitutional: He is oriented to person, place, and time. He appears well-developed and well-nourished. No distress.  HENT:  Head: Normocephalic and atraumatic.  Right Ear: External ear normal.  Left Ear: External ear normal.  Nose: Nose normal.  Mouth/Throat: Oropharynx is clear and moist.  Eyes: Conjunctivae and EOM are normal. Pupils are equal, round, and reactive to light.  Neck: Normal range of motion. Neck supple. No thyromegaly present.  Cardiovascular: Normal rate, regular rhythm and normal heart sounds.  No murmur  heard. Pulmonary/Chest: Effort normal and breath sounds normal. No respiratory distress. He has no wheezes. He has no rales.  Abdominal: Soft. Bowel sounds are normal. He exhibits no distension. There is no tenderness.  Musculoskeletal: Normal range of motion. He exhibits no edema.  Lymphadenopathy:    He has no cervical adenopathy.  Neurological: He is alert and oriented to person, place, and time. He has normal reflexes.  Skin: Skin is warm and dry.  The right great nail is discolored darker pigmented.  There is onycholysis of the nail bed with honeycombing mattering beneath the nail.  To a lesser degree of the left is also affected.  Psychiatric: He has a normal mood and affect. His behavior is normal. Judgment and thought content normal.     Assessment & Plan:   Bruce Fisher was seen today for hypertension.  Diagnoses and all orders for this visit:  Essential hypertension -     benazepril (LOTENSIN) 40 MG tablet; Take 1 tablet (40 mg total) by mouth daily. -     triamterene-hydrochlorothiazide (MAXZIDE-25) 37.5-25 MG tablet; Take 1 tablet by mouth daily. -     CMP14+EGFR  Mixed hyperlipidemia -     Lipid panel  Hyperlipidemia -     pravastatin (PRAVACHOL) 40 MG tablet; Take 1 tablet (40 mg total) by mouth daily.  Erectile dysfunction, unspecified erectile dysfunction type -     Testosterone,Free and Total  Benign prostatic hyperplasia, unspecified whether lower urinary tract symptoms present -     PSA, total and free  Onychomycosis of great toe  Other orders -     pantoprazole (PROTONIX) 40 MG tablet; TAKE ONE TABLET BY MOUTH ONCE DAILY AS NEEDED FOR ACID REFLUX -     sildenafil (REVATIO) 20 MG tablet; TAKE 40 MG TO 100 MG BY MOUTH AS NEEDED -     finasteride (PROSCAR) 5 MG tablet; Take 1 tablet (5 mg total) by mouth daily. -     tamsulosin (FLOMAX) 0.4 MG CAPS capsule; Take 1 capsule (0.4 mg total) by mouth at bedtime. -     terbinafine (LAMISIL) 250 MG tablet; Take 1 tablet  (250 mg total) by mouth daily.   I have discontinued Bruce Fisher's oseltamivir and doxycycline. I am also having him start on finasteride, tamsulosin, and terbinafine. Additionally, I am having him maintain his aspirin EC, multivitamin with minerals, Cholecalciferol (VITAMIN D PO), ibuprofen, benazepril, triamterene-hydrochlorothiazide, pravastatin, pantoprazole, and sildenafil.  Meds ordered this encounter  Medications  . benazepril (LOTENSIN) 40 MG tablet    Sig: Take 1 tablet (40 mg total) by mouth daily.    Dispense:  90 tablet    Refill:  1  . triamterene-hydrochlorothiazide (MAXZIDE-25) 37.5-25 MG tablet    Sig: Take 1 tablet by mouth daily.    Dispense:  90 tablet    Refill:  1  . pravastatin (PRAVACHOL) 40 MG tablet    Sig: Take 1 tablet (40 mg total) by mouth daily.    Dispense:  90 tablet    Refill:  1  . pantoprazole (PROTONIX) 40 MG tablet    Sig: TAKE  ONE TABLET BY MOUTH ONCE DAILY AS NEEDED FOR ACID REFLUX    Dispense:  90 tablet    Refill:  1    Please consider 90 day supplies to promote better adherence  . sildenafil (REVATIO) 20 MG tablet    Sig: TAKE 40 MG TO 100 MG BY MOUTH AS NEEDED    Dispense:  15 tablet    Refill:  2    Please consider 90 day supplies to promote better adherence  . finasteride (PROSCAR) 5 MG tablet    Sig: Take 1 tablet (5 mg total) by mouth daily.    Dispense:  90 tablet    Refill:  1  . tamsulosin (FLOMAX) 0.4 MG CAPS capsule    Sig: Take 1 capsule (0.4 mg total) by mouth at bedtime.    Dispense:  90 capsule    Refill:  1  . terbinafine (LAMISIL) 250 MG tablet    Sig: Take 1 tablet (250 mg total) by mouth daily.    Dispense:  90 tablet    Refill:  0     Follow-up: Return in about 6 months (around 02/18/2018).  [name], M.D.

## 2017-09-01 ENCOUNTER — Other Ambulatory Visit: Payer: BLUE CROSS/BLUE SHIELD

## 2017-09-04 LAB — CMP14+EGFR
ALBUMIN: 4.3 g/dL (ref 3.5–5.5)
ALK PHOS: 51 IU/L (ref 39–117)
ALT: 23 IU/L (ref 0–44)
AST: 19 IU/L (ref 0–40)
Albumin/Globulin Ratio: 1.2 (ref 1.2–2.2)
BUN/Creatinine Ratio: 19 (ref 9–20)
BUN: 21 mg/dL (ref 6–24)
Bilirubin Total: 0.5 mg/dL (ref 0.0–1.2)
CALCIUM: 9.1 mg/dL (ref 8.7–10.2)
CO2: 22 mmol/L (ref 20–29)
Chloride: 106 mmol/L (ref 96–106)
Creatinine, Ser: 1.08 mg/dL (ref 0.76–1.27)
GFR calc Af Amer: 89 mL/min/{1.73_m2} (ref >59)
GFR, EST NON AFRICAN AMERICAN: 77 mL/min/{1.73_m2} (ref >59)
GLOBULIN, TOTAL: 3.5 g/dL (ref 1.5–4.5)
GLUCOSE: 113 mg/dL — AB (ref 65–99)
Potassium: 3.7 mmol/L (ref 3.5–5.2)
SODIUM: 144 mmol/L (ref 134–144)
Total Protein: 7.8 g/dL (ref 6.0–8.5)

## 2017-09-04 LAB — PSA, TOTAL AND FREE
PSA FREE PCT: 37.4 %
PSA FREE: 0.71 ng/mL
Prostate Specific Ag, Serum: 1.9 ng/mL (ref 0.0–4.0)

## 2017-09-04 LAB — LIPID PANEL
CHOLESTEROL TOTAL: 156 mg/dL (ref 100–199)
Chol/HDL Ratio: 3.6 ratio (ref 0.0–5.0)
HDL: 43 mg/dL (ref >39)
LDL CALC: 93 mg/dL (ref 0–99)
TRIGLYCERIDES: 99 mg/dL (ref 0–149)
VLDL Cholesterol Cal: 20 mg/dL (ref 5–40)

## 2017-09-04 LAB — TESTOSTERONE,FREE AND TOTAL
TESTOSTERONE FREE: 10.2 pg/mL (ref 7.2–24.0)
TESTOSTERONE: 608 ng/dL (ref 264–916)

## 2017-10-26 ENCOUNTER — Encounter: Payer: Self-pay | Admitting: *Deleted

## 2018-02-16 ENCOUNTER — Ambulatory Visit: Payer: BLUE CROSS/BLUE SHIELD | Admitting: Family Medicine

## 2018-02-19 ENCOUNTER — Ambulatory Visit: Payer: BLUE CROSS/BLUE SHIELD | Admitting: Family Medicine

## 2018-02-20 ENCOUNTER — Telehealth: Payer: Self-pay | Admitting: Family Medicine

## 2018-02-20 DIAGNOSIS — I10 Essential (primary) hypertension: Secondary | ICD-10-CM

## 2018-02-20 DIAGNOSIS — E785 Hyperlipidemia, unspecified: Secondary | ICD-10-CM

## 2018-02-20 MED ORDER — PANTOPRAZOLE SODIUM 40 MG PO TBEC: DELAYED_RELEASE_TABLET | ORAL | 0 refills | Status: DC

## 2018-02-20 MED ORDER — PRAVASTATIN SODIUM 40 MG PO TABS: 40.0000 mg | ORAL_TABLET | Freq: Every day | ORAL | 1 refills | Status: DC

## 2018-02-20 MED ORDER — BENAZEPRIL HCL 40 MG PO TABS: 40.0000 mg | ORAL_TABLET | Freq: Every day | ORAL | 0 refills | Status: DC

## 2018-02-20 MED ORDER — TAMSULOSIN HCL 0.4 MG PO CAPS: 0.4000 mg | ORAL_CAPSULE | Freq: Every day | ORAL | 0 refills | Status: DC

## 2018-02-20 MED ORDER — TRIAMTERENE-HCTZ 37.5-25 MG PO TABS: 1.0000 | ORAL_TABLET | Freq: Every day | ORAL | 0 refills | Status: DC

## 2018-02-20 MED ORDER — FINASTERIDE 5 MG PO TABS: 5.0000 mg | ORAL_TABLET | Freq: Every day | ORAL | 0 refills | Status: DC

## 2018-02-20 NOTE — Telephone Encounter (Signed)
Patient aware refills have been sent to pharmacy 

## 2018-02-24 ENCOUNTER — Other Ambulatory Visit: Payer: Self-pay | Admitting: Family Medicine

## 2018-02-26 ENCOUNTER — Other Ambulatory Visit: Payer: Self-pay | Admitting: Family Medicine

## 2018-03-07 ENCOUNTER — Ambulatory Visit: Payer: BLUE CROSS/BLUE SHIELD | Admitting: Family Medicine

## 2018-03-07 ENCOUNTER — Encounter: Payer: Self-pay | Admitting: Family Medicine

## 2018-03-07 VITALS — BP 133/71 | HR 60 | Temp 98.0°F | Ht 74.0 in | Wt 253.2 lb

## 2018-03-07 DIAGNOSIS — I499 Cardiac arrhythmia, unspecified: Secondary | ICD-10-CM

## 2018-03-07 DIAGNOSIS — I10 Essential (primary) hypertension: Secondary | ICD-10-CM

## 2018-03-07 DIAGNOSIS — E782 Mixed hyperlipidemia: Secondary | ICD-10-CM

## 2018-03-07 MED ORDER — MOMETASONE FUROATE 50 MCG/ACT NA SUSP: NASAL | 5 refills | Status: DC

## 2018-03-07 MED ORDER — FEXOFENADINE-PSEUDOEPHED ER 180-240 MG PO TB24: 1.0000 | ORAL_TABLET | Freq: Every day | ORAL | 3 refills | Status: DC

## 2018-03-07 MED ORDER — FINASTERIDE 5 MG PO TABS: 5.0000 mg | ORAL_TABLET | Freq: Every day | ORAL | 1 refills | Status: DC

## 2018-03-07 MED ORDER — BENAZEPRIL HCL 40 MG PO TABS: 40.0000 mg | ORAL_TABLET | Freq: Every day | ORAL | 1 refills | Status: DC

## 2018-03-07 MED ORDER — TERBINAFINE HCL 250 MG PO TABS: 250.0000 mg | ORAL_TABLET | Freq: Every day | ORAL | 1 refills | Status: DC

## 2018-03-07 MED ORDER — PANTOPRAZOLE SODIUM 40 MG PO TBEC: DELAYED_RELEASE_TABLET | ORAL | 1 refills | Status: DC

## 2018-03-07 MED ORDER — TRIAMTERENE-HCTZ 37.5-25 MG PO TABS: 1.0000 | ORAL_TABLET | Freq: Every day | ORAL | 1 refills | Status: DC

## 2018-03-07 MED ORDER — TAMSULOSIN HCL 0.4 MG PO CAPS: 0.4000 mg | ORAL_CAPSULE | Freq: Every day | ORAL | 1 refills | Status: DC

## 2018-03-07 MED ORDER — SILDENAFIL CITRATE 20 MG PO TABS: ORAL_TABLET | ORAL | 2 refills | Status: DC

## 2018-03-07 NOTE — Patient Instructions (Signed)

## 2018-03-08 LAB — CMP14+EGFR
ALBUMIN: 4.2 g/dL (ref 3.5–5.5)
ALT: 18 IU/L (ref 0–44)
AST: 18 IU/L (ref 0–40)
Albumin/Globulin Ratio: 1.2 (ref 1.2–2.2)
Alkaline Phosphatase: 49 IU/L (ref 39–117)
BUN / CREAT RATIO: 19 (ref 9–20)
BUN: 20 mg/dL (ref 6–24)
Bilirubin Total: 0.5 mg/dL (ref 0.0–1.2)
CALCIUM: 9.2 mg/dL (ref 8.7–10.2)
CO2: 23 mmol/L (ref 20–29)
CREATININE: 1.03 mg/dL (ref 0.76–1.27)
Chloride: 103 mmol/L (ref 96–106)
GFR calc Af Amer: 95 mL/min/{1.73_m2} (ref >59)
GFR calc non Af Amer: 82 mL/min/{1.73_m2} (ref >59)
GLOBULIN, TOTAL: 3.4 g/dL (ref 1.5–4.5)
GLUCOSE: 93 mg/dL (ref 65–99)
Potassium: 3.8 mmol/L (ref 3.5–5.2)
SODIUM: 139 mmol/L (ref 134–144)
TOTAL PROTEIN: 7.6 g/dL (ref 6.0–8.5)

## 2018-03-08 LAB — LIPID PANEL
CHOL/HDL RATIO: 4.2 ratio (ref 0.0–5.0)
CHOLESTEROL TOTAL: 164 mg/dL (ref 100–199)
HDL: 39 mg/dL — ABNORMAL LOW (ref >39)
LDL CALC: 102 mg/dL — AB (ref 0–99)
Triglycerides: 116 mg/dL (ref 0–149)
VLDL Cholesterol Cal: 23 mg/dL (ref 5–40)

## 2018-03-12 ENCOUNTER — Encounter: Payer: Self-pay | Admitting: Family Medicine

## 2018-03-12 ENCOUNTER — Ambulatory Visit (INDEPENDENT_AMBULATORY_CARE_PROVIDER_SITE_OTHER): Payer: BLUE CROSS/BLUE SHIELD

## 2018-03-12 DIAGNOSIS — I499 Cardiac arrhythmia, unspecified: Secondary | ICD-10-CM | POA: Diagnosis not present

## 2018-03-12 NOTE — Progress Notes (Signed)
24 hour monitor was placed per Dr. Livia Snellen.  Patient verbalizes understanding to return to the office tomorrow.

## 2018-03-12 NOTE — Progress Notes (Signed)
Subjective:  Patient ID: Bruce Fisher, male    DOB: November 07, 1960  Age: 57 y.o. MRN: [id number]  CC: Medical Management of Chronic Issues (pt here today for routine follow up and also c/o sinus/allergies)   HPI Bruce Fisher presents for  follow-up of hypertension. Patient has no history of headache chest pain or shortness of breath or recent cough. Patient also denies symptoms of TIA such as focal numbness or weakness. Patient denies side effects from medication. States taking it regularly.  Patient in for follow-up of elevated cholesterol. Doing well without complaints on current medication. Denies side effects of statin including myalgia and arthralgia and nausea. Also in today for liver function testing. Currently no chest pain, shortness of breath or other cardiovascular related symptoms noted. History Bruce Fisher has a past medical history of Hyperlipidemia, Hypertension, Rectal spasm, and Renal disorder.   He has a past surgical history that includes Spine surgery.   His family history includes Cancer in his sister; Diabetes in his mother.He reports that he quit smoking about 24 years ago. His smoking use included cigarettes. He has never used smokeless tobacco. He reports that he does not drink alcohol or use drugs.  Current Outpatient Medications on File Prior to Visit  Medication Sig Dispense Refill  . aspirin EC 81 MG tablet Take 81 mg by mouth daily.    . Cholecalciferol (VITAMIN D PO) Take 1 tablet by mouth daily.    Marland Kitchen ibuprofen (ADVIL,MOTRIN) 800 MG tablet Take 1 tablet (800 mg total) by mouth every 8 (eight) hours as needed for moderate pain. 21 tablet 0  . Multiple Vitamin (MULTIVITAMIN WITH MINERALS) TABS tablet Take 1 tablet by mouth daily.    . pravastatin (PRAVACHOL) 40 MG tablet Take 1 tablet (40 mg total) by mouth daily. 90 tablet 1   No current facility-administered medications on file prior to visit.     ROS Review of Systems  Constitutional: Negative.   HENT:  Negative.   Eyes: Negative for visual disturbance.  Respiratory: Negative for cough and shortness of breath.   Cardiovascular: Negative for chest pain and leg swelling.  Gastrointestinal: Negative for abdominal pain, diarrhea, nausea and vomiting.  Genitourinary: Negative for difficulty urinating.  Musculoskeletal: Negative for arthralgias and myalgias.  Skin: Negative for rash.  Neurological: Negative for headaches.  Psychiatric/Behavioral: Negative for sleep disturbance.    Objective:  BP 133/71   Pulse 60   Temp 98 F (36.7 C) (Oral)   Ht '6\' 2"'  (1.88 m)   Wt 253 lb 4 oz (114.9 kg)   BMI 32.52 kg/m   BP Readings from Last 3 Encounters:  03/07/18 133/71  08/21/17 138/69  12/27/16 132/79    Wt Readings from Last 3 Encounters:  03/07/18 253 lb 4 oz (114.9 kg)  08/21/17 246 lb (111.6 kg)  12/27/16 248 lb (112.5 kg)     Physical Exam  Constitutional: He is oriented to person, place, and time. He appears well-developed and well-nourished. No distress.  HENT:  Head: Normocephalic and atraumatic.  Right Ear: External ear normal.  Left Ear: External ear normal.  Nose: Nose normal.  Mouth/Throat: Oropharynx is clear and moist.  Eyes: Pupils are equal, round, and reactive to light. Conjunctivae and EOM are normal.  Neck: Normal range of motion. Neck supple.  Cardiovascular: Normal rate and normal heart sounds. An irregularly irregular rhythm present.  No murmur heard. Pulmonary/Chest: Effort normal and breath sounds normal. No respiratory distress. He has no wheezes. He has no rales.  Abdominal:  Soft. There is no tenderness.  Musculoskeletal: Normal range of motion.  Neurological: He is alert and oriented to person, place, and time. He has normal reflexes.  Skin: Skin is warm and dry.  Psychiatric: He has a normal mood and affect. His behavior is normal. Judgment and thought content normal.      Assessment & Plan:   Bruce Fisher was seen today for medical management of  chronic issues.  Diagnoses and all orders for this visit:  Irregular heart beat  Essential hypertension -     benazepril (LOTENSIN) 40 MG tablet; Take 1 tablet (40 mg total) by mouth daily. -     triamterene-hydrochlorothiazide (MAXZIDE-25) 37.5-25 MG tablet; Take 1 tablet by mouth daily.  Mixed hyperlipidemia -     CMP14+EGFR -     Lipid panel  Other orders -     fexofenadine-pseudoephedrine (ALLEGRA-D 24) 180-240 MG 24 hr tablet; Take 1 tablet by mouth at bedtime. -     mometasone (NASONEX) 50 MCG/ACT nasal spray; Place 2 sprays in each nostril daily -     finasteride (PROSCAR) 5 MG tablet; Take 1 tablet (5 mg total) by mouth daily. -     pantoprazole (PROTONIX) 40 MG tablet; TAKE ONE TABLET BY MOUTH ONCE DAILY AS NEEDED FOR ACID REFLUX -     sildenafil (REVATIO) 20 MG tablet; TAKE 2 TO 5 TABLETS BY MOUTH AS NEEDED -     tamsulosin (FLOMAX) 0.4 MG CAPS capsule; Take 1 capsule (0.4 mg total) by mouth at bedtime. -     terbinafine (LAMISIL) 250 MG tablet; Take 1 tablet (250 mg total) by mouth daily.   Allergies as of 03/07/2018   No Known Allergies     Medication List        Accurate as of 03/07/18 11:59 PM. Always use your most recent med list.          aspirin EC 81 MG tablet Take 81 mg by mouth daily.   benazepril 40 MG tablet Commonly known as:  LOTENSIN Take 1 tablet (40 mg total) by mouth daily.   fexofenadine-pseudoephedrine 180-240 MG 24 hr tablet Commonly known as:  ALLEGRA-D 24 Take 1 tablet by mouth at bedtime.   finasteride 5 MG tablet Commonly known as:  PROSCAR Take 1 tablet (5 mg total) by mouth daily.   ibuprofen 800 MG tablet Commonly known as:  ADVIL,MOTRIN Take 1 tablet (800 mg total) by mouth every 8 (eight) hours as needed for moderate pain.   mometasone 50 MCG/ACT nasal spray Commonly known as:  NASONEX Place 2 sprays in each nostril daily   multivitamin with minerals Tabs tablet Take 1 tablet by mouth daily.   pantoprazole 40 MG  tablet Commonly known as:  PROTONIX TAKE ONE TABLET BY MOUTH ONCE DAILY AS NEEDED FOR ACID REFLUX   pravastatin 40 MG tablet Commonly known as:  PRAVACHOL Take 1 tablet (40 mg total) by mouth daily.   sildenafil 20 MG tablet Commonly known as:  REVATIO TAKE 2 TO 5 TABLETS BY MOUTH AS NEEDED   tamsulosin 0.4 MG Caps capsule Commonly known as:  FLOMAX Take 1 capsule (0.4 mg total) by mouth at bedtime.   terbinafine 250 MG tablet Commonly known as:  LAMISIL Take 1 tablet (250 mg total) by mouth daily.   triamterene-hydrochlorothiazide 37.5-25 MG tablet Commonly known as:  MAXZIDE-25 Take 1 tablet by mouth daily.   VITAMIN D PO Take 1 tablet by mouth daily.       Meds ordered  this encounter  Medications  . fexofenadine-pseudoephedrine (ALLEGRA-D 24) 180-240 MG 24 hr tablet    Sig: Take 1 tablet by mouth at bedtime.    Dispense:  90 tablet    Refill:  3  . mometasone (NASONEX) 50 MCG/ACT nasal spray    Sig: Place 2 sprays in each nostril daily    Dispense:  17 g    Refill:  5  . benazepril (LOTENSIN) 40 MG tablet    Sig: Take 1 tablet (40 mg total) by mouth daily.    Dispense:  90 tablet    Refill:  1  . triamterene-hydrochlorothiazide (MAXZIDE-25) 37.5-25 MG tablet    Sig: Take 1 tablet by mouth daily.    Dispense:  90 tablet    Refill:  1  . finasteride (PROSCAR) 5 MG tablet    Sig: Take 1 tablet (5 mg total) by mouth daily.    Dispense:  90 tablet    Refill:  1  . pantoprazole (PROTONIX) 40 MG tablet    Sig: TAKE ONE TABLET BY MOUTH ONCE DAILY AS NEEDED FOR ACID REFLUX    Dispense:  90 tablet    Refill:  1    Please consider 90 day supplies to promote better adherence  . sildenafil (REVATIO) 20 MG tablet    Sig: TAKE 2 TO 5 TABLETS BY MOUTH AS NEEDED    Dispense:  15 tablet    Refill:  2    Please consider 90 day supplies to promote better adherence  . tamsulosin (FLOMAX) 0.4 MG CAPS capsule    Sig: Take 1 capsule (0.4 mg total) by mouth at bedtime.     Dispense:  90 capsule    Refill:  1  . terbinafine (LAMISIL) 250 MG tablet    Sig: Take 1 tablet (250 mg total) by mouth daily.    Dispense:  90 tablet    Refill:  1    Holter monitor to be placed at his earliest convenience for the irregular heartbeat to determine if it is significant for atrial fibrillation etc.  Follow-up: Return in about 2 weeks (around 03/21/2018).  [name], M.D.

## 2018-03-13 NOTE — Telephone Encounter (Signed)
Patient seen since phone call 

## 2018-04-30 ENCOUNTER — Other Ambulatory Visit: Payer: Self-pay | Admitting: Family Medicine

## 2018-04-30 DIAGNOSIS — I472 Ventricular tachycardia: Secondary | ICD-10-CM

## 2018-04-30 DIAGNOSIS — I4729 Other ventricular tachycardia: Secondary | ICD-10-CM

## 2018-05-03 ENCOUNTER — Telehealth: Payer: Self-pay | Admitting: *Deleted

## 2018-05-04 NOTE — Telephone Encounter (Signed)
Spoke with pt regarding holter report and referral Pt verbalizes understanding

## 2018-06-06 ENCOUNTER — Telehealth: Payer: Self-pay | Admitting: Family Medicine

## 2018-06-11 ENCOUNTER — Telehealth: Payer: Self-pay | Admitting: Family Medicine

## 2018-06-12 ENCOUNTER — Other Ambulatory Visit: Payer: Self-pay

## 2018-06-12 DIAGNOSIS — I4729 Other ventricular tachycardia: Secondary | ICD-10-CM

## 2018-06-12 DIAGNOSIS — I472 Ventricular tachycardia: Secondary | ICD-10-CM

## 2018-07-31 NOTE — Progress Notes (Signed)
Cardiology Office Note   Date:  08/01/2018   ID:  Domingos, Riggi [birth year], MRN [id number]  PCP:  [name], MD  Cardiologist:   No primary care provider on file. Referring:  [name], MD  No chief complaint on file.     History of Present Illness: Bruce Fisher is a 58 y.o. male who is referred by [name], MD for evaluation of PVCs.  The patient has no past cardiac history.  He was noted this summer to have premature ventricular contractions in a bigeminal pattern.  This was found incidentally on physical.  He actually does not feel this.  He feels quite well.  The patient denies any new symptoms such as chest discomfort, neck or arm discomfort. There has been no new shortness of breath, PND or orthopnea. There have been no reported palpitations, presyncope or syncope.  He is very vigorously active in his job and says he has no symptoms related to this.  I did review the EKG and it was otherwise unremarkable.  Electrolytes in August were unremarkable.  He is never had any other cardiovascular testing.  Past Medical History:  Diagnosis Date  . Hyperlipidemia   . Hypertension   . Rectal spasm   . Renal disorder    kidney stones    Past Surgical History:  Procedure Laterality Date  . SPINE SURGERY     lower back     Current Outpatient Medications  Medication Sig Dispense Refill  . aspirin EC 81 MG tablet Take 81 mg by mouth daily.    . benazepril (LOTENSIN) 40 MG tablet Take 1 tablet (40 mg total) by mouth daily. 90 tablet 1  . Cholecalciferol (VITAMIN D PO) Take 1 tablet by mouth daily.    Marland Kitchen ibuprofen (ADVIL,MOTRIN) 800 MG tablet Take 1 tablet (800 mg total) by mouth every 8 (eight) hours as needed for moderate pain. 21 tablet 0  . Multiple Vitamin (MULTIVITAMIN WITH MINERALS) TABS tablet Take 1 tablet by mouth daily.    . pantoprazole (PROTONIX) 40 MG tablet TAKE ONE TABLET BY MOUTH ONCE DAILY AS NEEDED FOR ACID REFLUX 90 tablet 1  . pravastatin  (PRAVACHOL) 40 MG tablet Take 1 tablet (40 mg total) by mouth daily. 90 tablet 1  . sildenafil (REVATIO) 20 MG tablet TAKE 2 TO 5 TABLETS BY MOUTH AS NEEDED 15 tablet 2  . triamterene-hydrochlorothiazide (MAXZIDE-25) 37.5-25 MG tablet Take 1 tablet by mouth daily. 90 tablet 1  . terbinafine (LAMISIL) 250 MG tablet Take 1 tablet (250 mg total) by mouth daily. (Patient not taking: Reported on 08/01/2018) 90 tablet 1   No current facility-administered medications for this visit.     Allergies:   Patient has no known allergies.    Social History:  The patient  reports that he quit smoking about 24 years ago. His smoking use included cigarettes. He has never used smokeless tobacco. He reports that he does not drink alcohol or use drugs.   Family History:  The patient's family history includes Cancer in his father and sister; Diabetes in his mother; Heart disease in his sister; Heart failure in his mother.    ROS:  Please see the history of present illness.   Otherwise, review of systems are positive for none.   All other systems are reviewed and negative.    PHYSICAL EXAM: VS:  BP (!) 160/80   Pulse (!) 40   Ht 6\' 2"  (1.88 m)   Wt 267 lb (121.1 kg)  BMI 34.28 kg/m  , BMI Body mass index is 34.28 kg/m. GENERAL:  Well appearing HEENT:  Pupils equal round and reactive, fundi not visualized, oral mucosa unremarkable NECK:  No jugular venous distention, waveform within normal limits, carotid upstroke brisk and symmetric, no bruits, no thyromegaly LYMPHATICS:  No cervical, inguinal adenopathy LUNGS:  Clear to auscultation bilaterally BACK:  No CVA tenderness CHEST:  Unremarkable HEART:  PMI not displaced or sustained,S1 and S2 within normal limits, no S3, no S4, no clicks, no rubs, no murmurs ABD:  Flat, positive bowel sounds normal in frequency in pitch, no bruits, no rebound, no guarding, no midline pulsatile mass, no hepatomegaly, no splenomegaly EXT:  2 plus pulses throughout, no edema,  no cyanosis no clubbing SKIN:  No rashes no nodules NEURO:  Cranial nerves II through XII grossly intact, motor grossly intact throughout PSYCH:  Cognitively intact, oriented to person place and time    EKG:  EKG is not ordered today. The rhythm strips 03/30/18  demonstrates sinus rhythm with ventricular bigeminy, axis within normal limits, intervals within normal limits,   Recent Labs: 03/07/2018: ALT 18; BUN 20; Creatinine, Ser 1.03; Potassium 3.8; Sodium 139    Lipid Panel    Component Value Date/Time   CHOL 164 03/07/2018 0911   TRIG 116 03/07/2018 0911   TRIG 208 (H) 08/19/2013 1637   HDL 39 (L) 03/07/2018 0911   HDL 42 08/19/2013 1637   CHOLHDL 4.2 03/07/2018 0911   LDLCALC 102 (H) 03/07/2018 0911   LDLCALC 93 08/19/2013 1637      Wt Readings from Last 3 Encounters:  08/01/18 267 lb (121.1 kg)  03/07/18 253 lb 4 oz (114.9 kg)  08/21/17 246 lb (111.6 kg)      Other studies Reviewed: Additional studies/ records that were reviewed today include: None he had a nuclear done. Review of the above records demonstrates:  Please see elsewhere in the note.     ASSESSMENT AND PLAN:  VENTRICULAR TACHYCARDIA:   He has ventricular ectopy.  He is not really feeling this.  We will check a TSH.  I will order a POET (Plain Old Exercise Treadmill).  Also check an echocardiogram.  If these are normal he is not having any symptoms so I would not consider further testing.  And he would not need treatment.  HTN: His blood pressure is not at target.  However, I reviewed previous readings and this is unusual.  I would suggest weight loss and follow-up of his blood pressure readings at home.  If he does not reach target he would need titration of his meds.  DYSLIPIDEMIA:  LDL was slightly elevated but in the absence of known coronary disease I would continue with aggressive risk factor modification with weight loss diet and exercise  Current medicines are reviewed at length with the patient  today.  The patient does not have concerns regarding medicines.  The following changes have been made:  no change  Labs/ tests ordered today include:   Orders Placed This Encounter  Procedures  . EXERCISE TOLERANCE TEST (ETT)  . ECHOCARDIOGRAM COMPLETE     Disposition:   FU with me based on the results of the above.     Signed, Minus Breeding, MD  08/01/2018 4:18 PM    [facility]

## 2018-08-01 ENCOUNTER — Encounter: Payer: Self-pay | Admitting: Cardiology

## 2018-08-01 ENCOUNTER — Ambulatory Visit (INDEPENDENT_AMBULATORY_CARE_PROVIDER_SITE_OTHER): Payer: PRIVATE HEALTH INSURANCE | Admitting: Cardiology

## 2018-08-01 VITALS — BP 160/80 | HR 40 | Ht 74.0 in | Wt 267.0 lb

## 2018-08-01 DIAGNOSIS — I10 Essential (primary) hypertension: Secondary | ICD-10-CM

## 2018-08-01 DIAGNOSIS — I493 Ventricular premature depolarization: Secondary | ICD-10-CM | POA: Diagnosis not present

## 2018-08-01 NOTE — Patient Instructions (Signed)
Medication Instructions:  The current medical regimen is effective;  continue present plan and medications.  Testing/Procedures: Your physician has requested that you have an exercise tolerance test. For further information please visit [email or web address]. Please also follow instruction sheet, as given.  Your physician has requested that you have an echocardiogram. Echocardiography is a painless test that uses sound waves to create images of your heart. It provides your doctor with information about the size and shape of your heart and how well your heart's chambers and valves are working. This procedure takes approximately one hour. There are no restrictions for this procedure.  Follow-Up: Follow up will be determined after the above testing.  Thank you for choosing [facility]!!

## 2018-08-14 ENCOUNTER — Ambulatory Visit (INDEPENDENT_AMBULATORY_CARE_PROVIDER_SITE_OTHER): Payer: PRIVATE HEALTH INSURANCE

## 2018-08-14 ENCOUNTER — Ambulatory Visit (HOSPITAL_COMMUNITY): Payer: PRIVATE HEALTH INSURANCE | Attending: Cardiovascular Disease

## 2018-08-14 ENCOUNTER — Other Ambulatory Visit: Payer: Self-pay

## 2018-08-14 DIAGNOSIS — I493 Ventricular premature depolarization: Secondary | ICD-10-CM

## 2018-08-14 DIAGNOSIS — I10 Essential (primary) hypertension: Secondary | ICD-10-CM | POA: Insufficient documentation

## 2018-08-14 LAB — EXERCISE TOLERANCE TEST
CHL CUP MPHR: 165 {beats}/min
CSEPEDS: 0 s
CSEPEW: 10.1 METS
CSEPHR: 91 %
CSEPPHR: 151 {beats}/min
Exercise duration (min): 8 min
RPE: 16
Rest HR: 82 {beats}/min

## 2018-09-07 ENCOUNTER — Ambulatory Visit: Payer: BLUE CROSS/BLUE SHIELD | Admitting: Family Medicine

## 2018-09-12 ENCOUNTER — Telehealth: Payer: Self-pay | Admitting: Family Medicine

## 2018-09-12 ENCOUNTER — Other Ambulatory Visit: Payer: Self-pay | Admitting: Pediatrics

## 2018-09-12 DIAGNOSIS — E785 Hyperlipidemia, unspecified: Secondary | ICD-10-CM

## 2018-09-12 NOTE — Telephone Encounter (Signed)
What is the name of the medication? Pravastatin 40 mg  Have you contacted your pharmacy to request a refill? Yes - Pt was last seen in Aug of 2019 had an appt on 09/07/18 it was cancelled - aware he may need an appt before this medication can be refilled.  Which pharmacy would you like this sent to? [facility] in [place]    Patient notified that their request is being sent to the clinical staff for review and that they should receive a call once it is complete. If they do not receive a call within 24 hours they can check with their pharmacy or our office.

## 2018-09-12 NOTE — Telephone Encounter (Signed)
Aware.  Script for 30 days sent in.  Needs to schedule office visit.

## 2018-10-06 ENCOUNTER — Other Ambulatory Visit: Payer: Self-pay | Admitting: Family Medicine

## 2018-10-06 DIAGNOSIS — I10 Essential (primary) hypertension: Secondary | ICD-10-CM

## 2018-10-08 ENCOUNTER — Telehealth: Payer: Self-pay | Admitting: Family Medicine

## 2018-10-08 ENCOUNTER — Other Ambulatory Visit: Payer: Self-pay

## 2018-10-08 DIAGNOSIS — E785 Hyperlipidemia, unspecified: Secondary | ICD-10-CM

## 2018-10-08 DIAGNOSIS — I10 Essential (primary) hypertension: Secondary | ICD-10-CM

## 2018-10-08 MED ORDER — TRIAMTERENE-HCTZ 37.5-25 MG PO TABS: 1.0000 | ORAL_TABLET | Freq: Every day | ORAL | 0 refills | Status: DC

## 2018-10-08 MED ORDER — PRAVASTATIN SODIUM 40 MG PO TABS: 40.0000 mg | ORAL_TABLET | Freq: Every day | ORAL | 0 refills | Status: DC

## 2018-10-08 MED ORDER — BENAZEPRIL HCL 40 MG PO TABS: 40.0000 mg | ORAL_TABLET | Freq: Every day | ORAL | 0 refills | Status: DC

## 2018-10-08 NOTE — Telephone Encounter (Signed)
Pt is needing refill on benazepril (LOTENSIN) 40 MG tablet and triamterene-hydrochlorothiazide (MAXZIDE-25) 37.5-25 MG tablet  Pt has apt on 10/24/2018 with [facility] [place]

## 2018-10-08 NOTE — Telephone Encounter (Signed)
He also needs pravastatin (PRAVACHOL) 40 MG tablet

## 2018-10-08 NOTE — Telephone Encounter (Signed)
Authorize 30 days only. Then contact the patient letting them know that they will need an appointment before any further prescriptions can be sent in. 

## 2018-10-08 NOTE — Telephone Encounter (Signed)
Last seen 03/07/18 

## 2018-10-08 NOTE — Telephone Encounter (Signed)
Sent to Dr Stacks 

## 2018-10-09 NOTE — Telephone Encounter (Signed)
Patient aware.

## 2018-10-24 ENCOUNTER — Other Ambulatory Visit: Payer: Self-pay

## 2018-10-24 ENCOUNTER — Ambulatory Visit (INDEPENDENT_AMBULATORY_CARE_PROVIDER_SITE_OTHER): Payer: PRIVATE HEALTH INSURANCE | Admitting: Family Medicine

## 2018-10-24 ENCOUNTER — Encounter: Payer: Self-pay | Admitting: Family Medicine

## 2018-10-24 DIAGNOSIS — I10 Essential (primary) hypertension: Secondary | ICD-10-CM

## 2018-10-24 DIAGNOSIS — K2100 Gastro-esophageal reflux disease with esophagitis, without bleeding: Secondary | ICD-10-CM

## 2018-10-24 DIAGNOSIS — E782 Mixed hyperlipidemia: Secondary | ICD-10-CM

## 2018-10-24 DIAGNOSIS — N529 Male erectile dysfunction, unspecified: Secondary | ICD-10-CM | POA: Diagnosis not present

## 2018-10-24 DIAGNOSIS — I498 Other specified cardiac arrhythmias: Secondary | ICD-10-CM | POA: Insufficient documentation

## 2018-10-24 DIAGNOSIS — I519 Heart disease, unspecified: Secondary | ICD-10-CM | POA: Diagnosis not present

## 2018-10-24 DIAGNOSIS — I5189 Other ill-defined heart diseases: Secondary | ICD-10-CM | POA: Insufficient documentation

## 2018-10-24 DIAGNOSIS — I499 Cardiac arrhythmia, unspecified: Secondary | ICD-10-CM

## 2018-10-24 MED ORDER — BENAZEPRIL HCL 40 MG PO TABS: 40.0000 mg | ORAL_TABLET | Freq: Every day | ORAL | 1 refills | Status: DC

## 2018-10-24 MED ORDER — PRAVASTATIN SODIUM 40 MG PO TABS: 40.0000 mg | ORAL_TABLET | Freq: Every day | ORAL | 1 refills | Status: DC

## 2018-10-24 MED ORDER — PANTOPRAZOLE SODIUM 40 MG PO TBEC: DELAYED_RELEASE_TABLET | ORAL | 1 refills | Status: DC

## 2018-10-24 MED ORDER — TRIAMTERENE-HCTZ 37.5-25 MG PO TABS: 1.0000 | ORAL_TABLET | Freq: Every day | ORAL | 1 refills | Status: DC

## 2018-10-24 NOTE — Progress Notes (Addendum)
Subjective:  Patient ID: Bruce Fisher, male    DOB: [birth year]  Age: 58 y.o. MRN: [id number]  CC: No chief complaint on file.   HPI Daltyn Degroat presents for follow-up of hypertension. Patient has no history of headache chest pain or shortness of breath or recent cough. Patient also denies symptoms of TIA such as numbness weakness lateralizing. Patient checks  blood pressure at home and has not had any elevated readings recently. Patient denies side effects from his medication. States taking it regularly. Patient in for follow-up of elevated cholesterol. Doing well without complaints on current medication. Denies side effects of statin including myalgia and arthralgia and nausea. Patient in for follow-up of GERD. Currently asymptomatic.  Patient is not having to take the PPI regularly.. There is no chest pain or heartburn. No hematemesis and no melena. No dysphagia or choking. Onset is remote. Progression is stable. Complicating factors, none. Patient also says sildenafil for his erectile dysfunction.  He takes 2 for each episode of intercourse and that works out just fine for him he has never even tried more than 2.    Patient also has been evaluated recently by Dr. Percival Spanish of cardiology with stress echocardiogram he was noted to have ventricular bigeminy on recovery.  Grade 1 diastolic dysfunction on the echocardiogram portion.  He is to follow-up with Dr. Percival Spanish in July of this year.  Depression screen [facility] 2/9 03/07/2018 08/21/2017 09/27/2016  Decreased Interest 0 0 0  Down, Depressed, Hopeless 0 0 0  PHQ - 2 Score 0 0 0    History Laderius has a past medical history of Hyperlipidemia, Hypertension, Rectal spasm, and Renal disorder.   He has a past surgical history that includes Spine surgery.   His family history includes Cancer in his father and sister; Diabetes in his mother; Heart disease in his sister; Heart failure in his mother.He reports that he quit smoking about 25 years ago.  His smoking use included cigarettes. He has never used smokeless tobacco. He reports that he does not drink alcohol or use drugs.    ROS Review of Systems  Constitutional: Negative.   HENT: Negative.   Eyes: Negative for visual disturbance.  Respiratory: Negative for cough and shortness of breath.   Cardiovascular: Negative for chest pain and leg swelling.  Gastrointestinal: Negative for abdominal pain, diarrhea, nausea and vomiting.  Genitourinary: Negative for difficulty urinating.  Musculoskeletal: Negative for arthralgias and myalgias.  Skin: Negative for rash.  Neurological: Negative for headaches.  Psychiatric/Behavioral: Negative for sleep disturbance.    Objective:  There were no vitals taken for this visit.  BP Readings from Last 3 Encounters:  08/01/18 (!) 160/80  03/07/18 133/71  08/21/17 138/69    Wt Readings from Last 3 Encounters:  08/01/18 267 lb (121.1 kg)  03/07/18 253 lb 4 oz (114.9 kg)  08/21/17 246 lb (111.6 kg)     Physical Exam  Phone visit  Assessment & Plan:   Diagnoses and all orders for this visit:  Essential hypertension -     benazepril (LOTENSIN) 40 MG tablet; Take 1 tablet (40 mg total) by mouth daily. (Needs to be seen before next refill) -     triamterene-hydrochlorothiazide (MAXZIDE-25) 37.5-25 MG tablet; Take 1 tablet by mouth daily. (Needs to be seen before next refill)  Hyperlipidemia -     pravastatin (PRAVACHOL) 40 MG tablet; Take 1 tablet (40 mg total) by mouth daily.  Erectile dysfunction, unspecified erectile dysfunction type  Gastroesophageal reflux disease with esophagitis  Grade I diastolic dysfunction  Ventricular bigeminy  Other orders -     pantoprazole (PROTONIX) 40 MG tablet; TAKE ONE TABLET BY MOUTH ONCE DAILY AS NEEDED FOR ACID REFLUX       I have discontinued Daniyal Bukowski's ibuprofen and terbinafine. I am also having him maintain his aspirin EC, multivitamin with minerals, Cholecalciferol (VITAMIN  D PO), sildenafil, benazepril, triamterene-hydrochlorothiazide, pravastatin, and pantoprazole.  Allergies as of 10/24/2018   No Known Allergies     Medication List       Accurate as of October 24, 2018 10:15 AM. Always use your most recent med list.        aspirin EC 81 MG tablet Take 81 mg by mouth daily.   benazepril 40 MG tablet Commonly known as:  LOTENSIN Take 1 tablet (40 mg total) by mouth daily. (Needs to be seen before next refill)   multivitamin with minerals Tabs tablet Take 1 tablet by mouth daily.   pantoprazole 40 MG tablet Commonly known as:  PROTONIX TAKE ONE TABLET BY MOUTH ONCE DAILY AS NEEDED FOR ACID REFLUX   pravastatin 40 MG tablet Commonly known as:  PRAVACHOL Take 1 tablet (40 mg total) by mouth daily.   sildenafil 20 MG tablet Commonly known as:  REVATIO TAKE 2 TO 5 TABLETS BY MOUTH AS NEEDED   triamterene-hydrochlorothiazide 37.5-25 MG tablet Commonly known as:  MAXZIDE-25 Take 1 tablet by mouth daily. (Needs to be seen before next refill)   VITAMIN D PO Take 1 tablet by mouth daily.      Virtual Visit via telephone Note  I discussed the limitations, risks, security and privacy concerns of performing an evaluation and management service by telephone and the availability of in person appointments. I also discussed with the patient that there may be a patient responsible charge related to this service. The patient expressed understanding and agreed to proceed.  Follow Up Instructions:   I discussed the assessment and treatment plan with the patient. The patient was provided an opportunity to ask questions and all were answered. The patient agreed with the plan and demonstrated an understanding of the instructions.  In particular should shortness of breath swelling chest pain palpitations occur he will need to get back in touch with me or Dr. Percival Spanish right away.   The patient was advised to call back or seek an in-person evaluation if the  symptoms worsen or if the condition fails to improve as anticipated.  Visit started: 9:45 Call ended:  10:12 Visit minutes: 22   Follow-up: Return in about 6 months (around 04/25/2019) for hypertension, Wellness.  [name], M.D.

## 2018-11-29 ENCOUNTER — Telehealth: Payer: Self-pay | Admitting: Family Medicine

## 2019-02-16 ENCOUNTER — Emergency Department (HOSPITAL_COMMUNITY)
Admission: EM | Admit: 2019-02-16 | Discharge: 2019-02-16 | Disposition: A | Discharge status: Home / Self Care | Payer: PRIVATE HEALTH INSURANCE | Attending: Emergency Medicine | Admitting: Emergency Medicine

## 2019-02-16 ENCOUNTER — Other Ambulatory Visit: Payer: Self-pay

## 2019-02-16 ENCOUNTER — Encounter (HOSPITAL_COMMUNITY): Payer: Self-pay

## 2019-02-16 ENCOUNTER — Emergency Department (HOSPITAL_COMMUNITY): Payer: PRIVATE HEALTH INSURANCE

## 2019-02-16 DIAGNOSIS — Z87442 Personal history of urinary calculi: Secondary | ICD-10-CM | POA: Insufficient documentation

## 2019-02-16 DIAGNOSIS — N202 Calculus of kidney with calculus of ureter: Secondary | ICD-10-CM | POA: Insufficient documentation

## 2019-02-16 DIAGNOSIS — R1031 Right lower quadrant pain: Secondary | ICD-10-CM | POA: Diagnosis present

## 2019-02-16 DIAGNOSIS — N23 Unspecified renal colic: Secondary | ICD-10-CM

## 2019-02-16 LAB — CBC WITH DIFFERENTIAL/PLATELET
Abs Immature Granulocytes: 0.03 10*3/uL (ref 0.00–0.07)
Basophils Absolute: 0 10*3/uL (ref 0.0–0.1)
Basophils Relative: 0 %
Eosinophils Absolute: 0.1 10*3/uL (ref 0.0–0.5)
Eosinophils Relative: 1 %
HCT: 44.7 % (ref 39.0–52.0)
Hemoglobin: 14.4 g/dL (ref 13.0–17.0)
Immature Granulocytes: 0 %
Lymphocytes Relative: 24 %
Lymphs Abs: 2.3 10*3/uL (ref 0.7–4.0)
MCH: 29.9 pg (ref 26.0–34.0)
MCHC: 32.2 g/dL (ref 30.0–36.0)
MCV: 92.9 fL (ref 80.0–100.0)
Monocytes Absolute: 0.7 10*3/uL (ref 0.1–1.0)
Monocytes Relative: 8 %
Neutro Abs: 6.3 10*3/uL (ref 1.7–7.7)
Neutrophils Relative %: 67 %
Platelets: 152 10*3/uL (ref 150–400)
RBC: 4.81 MIL/uL (ref 4.22–5.81)
RDW: 13.7 % (ref 11.5–15.5)
WBC: 9.4 10*3/uL (ref 4.0–10.5)
nRBC: 0 % (ref 0.0–0.2)

## 2019-02-16 LAB — BASIC METABOLIC PANEL
Anion gap: 6 (ref 5–15)
BUN: 25 mg/dL — ABNORMAL HIGH (ref 6–20)
CO2: 26 mmol/L (ref 22–32)
Calcium: 9.1 mg/dL (ref 8.9–10.3)
Chloride: 107 mmol/L (ref 98–111)
Creatinine, Ser: 1.25 mg/dL — ABNORMAL HIGH (ref 0.61–1.24)
GFR calc Af Amer: >60 mL/min (ref >60)
GFR calc non Af Amer: >60 mL/min (ref >60)
Glucose, Bld: 167 mg/dL — ABNORMAL HIGH (ref 70–99)
Potassium: 4 mmol/L (ref 3.5–5.1)
Sodium: 139 mmol/L (ref 135–145)

## 2019-02-16 LAB — URINALYSIS, ROUTINE W REFLEX MICROSCOPIC
Bilirubin Urine: NEGATIVE
Glucose, UA: NEGATIVE mg/dL
Ketones, ur: NEGATIVE mg/dL
Leukocytes,Ua: NEGATIVE
Nitrite: NEGATIVE
Protein, ur: NEGATIVE mg/dL
Specific Gravity, Urine: 1.028 (ref 1.005–1.030)
pH: 5 (ref 5.0–8.0)

## 2019-02-16 MED ORDER — TAMSULOSIN HCL 0.4 MG PO CAPS: 0.4000 mg | ORAL_CAPSULE | Freq: Every day | ORAL | 0 refills | Status: DC

## 2019-02-16 MED ORDER — HYDROMORPHONE HCL 1 MG/ML IJ SOLN
1.0000 mg | Freq: Once | INTRAMUSCULAR | Status: AC
  Administered 2019-02-16: 07:00:00 1 mg via INTRAVENOUS
  Filled 2019-02-16: qty 1

## 2019-02-16 MED ORDER — KETOROLAC TROMETHAMINE 30 MG/ML IJ SOLN
15.0000 mg | Freq: Once | INTRAMUSCULAR | Status: AC
  Administered 2019-02-16: 10:00:00 15 mg via INTRAVENOUS
  Filled 2019-02-16: qty 1

## 2019-02-16 MED ORDER — OXYCODONE-ACETAMINOPHEN 5-325 MG PO TABS: 1.0000 | ORAL_TABLET | ORAL | 0 refills | Status: DC | PRN

## 2019-02-16 MED ORDER — ONDANSETRON HCL 8 MG PO TABS: 8.0000 mg | ORAL_TABLET | ORAL | 0 refills | Status: DC | PRN

## 2019-02-16 MED ORDER — ONDANSETRON HCL 4 MG/2ML IJ SOLN
4.0000 mg | Freq: Once | INTRAMUSCULAR | Status: AC
  Administered 2019-02-16: 07:00:00 4 mg via INTRAVENOUS
  Filled 2019-02-16: qty 2

## 2019-02-16 MED ORDER — KETOROLAC TROMETHAMINE 30 MG/ML IJ SOLN
30.0000 mg | Freq: Once | INTRAMUSCULAR | Status: AC
  Administered 2019-02-16: 07:00:00 30 mg via INTRAVENOUS
  Filled 2019-02-16: qty 1

## 2019-02-16 NOTE — Discharge Instructions (Addendum)
You have 3 small kidney stones in your right ureter.  These should pass.  Medication for pain, nausea, and medication to increase your urine flow through the ureter.  Follow-up with urology.  Phone number given.

## 2019-02-16 NOTE — ED Notes (Signed)
Patient transported to CT 

## 2019-02-16 NOTE — ED Provider Notes (Signed)
Patient still complains of mild right flank pain.  Test results discussed.  Will repeat Toradol 15 mg IV.  Discharge medication Percocet, Flomax 0.4 mg, Zofran 8 mg.  Referral to urology.   Nat Christen, MD 02/16/19 [phone]

## 2019-02-16 NOTE — ED Notes (Signed)
Pt aware urine needed, urinal given

## 2019-02-16 NOTE — ED Triage Notes (Signed)
Pt reports right flank pain that started 3 hours ago, awoke him from sleep, history of previous kidney stones.

## 2019-02-18 ENCOUNTER — Other Ambulatory Visit: Payer: Self-pay

## 2019-02-18 ENCOUNTER — Emergency Department (HOSPITAL_COMMUNITY)
Admission: EM | Admit: 2019-02-18 | Discharge: 2019-02-18 | Disposition: A | Discharge status: Home / Self Care | Payer: PRIVATE HEALTH INSURANCE | Attending: Emergency Medicine | Admitting: Emergency Medicine

## 2019-02-18 ENCOUNTER — Encounter (HOSPITAL_COMMUNITY): Payer: Self-pay | Admitting: *Deleted

## 2019-02-18 DIAGNOSIS — R1030 Lower abdominal pain, unspecified: Secondary | ICD-10-CM | POA: Diagnosis not present

## 2019-02-18 DIAGNOSIS — Z5321 Procedure and treatment not carried out due to patient leaving prior to being seen by health care provider: Secondary | ICD-10-CM | POA: Diagnosis not present

## 2019-02-18 DIAGNOSIS — Z87442 Personal history of urinary calculi: Secondary | ICD-10-CM | POA: Diagnosis not present

## 2019-02-18 DIAGNOSIS — M545 Low back pain: Secondary | ICD-10-CM | POA: Insufficient documentation

## 2019-02-18 NOTE — ED Triage Notes (Signed)
Pt reports he was seen here 2 days ago for kidney stones, meds are not helping. Pt reports right lower back pain that radiates to right lower abd worsening since Friday night. Nausea today

## 2019-02-18 NOTE — ED Notes (Signed)
Pt seen walking towards waiting are with steady gate.

## 2019-02-20 ENCOUNTER — Other Ambulatory Visit: Payer: Self-pay

## 2019-02-20 NOTE — Progress Notes (Signed)
Assessment & Plan:  1. Multiple kidney stones - Education provided on dietary changes to prevent kidney stones. Patient was given collection device to strain urine and collect stone when it passes to be sent to the lab for testing. He is going to pick up the Flomax I resent. I also had to reorder the Zofran and Percocet, as he is almost out. I advised him to call and schedule an appointment with [facility] now that his pain is better controlled. Ibuprofen 600 mg sent for patient to take with the Percocet PRN.  - tamsulosin (FLOMAX) 0.4 MG CAPS capsule; Take 1 capsule (0.4 mg total) by mouth daily.  Dispense: 10 capsule; Refill: 0 - ibuprofen (ADVIL) 600 MG tablet; Take 1 tablet (600 mg total) by mouth every 8 (eight) hours as needed.  Dispense: 30 tablet; Refill: 0 - ondansetron (ZOFRAN) 8 MG tablet; Take 1 tablet (8 mg total) by mouth every 4 (four) hours as needed.  Dispense: 21 tablet; Refill: 0 - oxyCODONE-acetaminophen (PERCOCET) 5-325 MG tablet; Take 1 tablet by mouth every 4 (four) hours as needed.  Dispense: 20 tablet; Refill: 0  2. Drug-induced constipation - Advised to use a Dulcolax suppository per rectum and then lie down today to allow time to work. Also encouraged daily use of Miralax while he is taking the Percocet for pain.    Follow up plan: Return if symptoms worsen or fail to improve.  Hendricks Limes, MSN, APRN, FNP-C [facility]  Subjective:   Patient ID: Bruce Fisher, male    DOB: 12-03-1960, 58 y.o.   MRN: [id number]  HPI: Bruce Fisher is a 58 y.o. male presenting on 02/21/2019 for ER follow up (Saturday morning. Kidney Stone )  Patient was seen at [facility] ER on 02/16/2019 due to multiple right sided kidney stones. He was discharged with Percocet, Flomax 0.4 mg, and Zofran 8mg . A referral to [facility] in [place] was placed.   Patient reports today he never received the Flomax from his pharmacy. Patient reports the Percocet  did not help the pain at all. He tried adding an additional Tylenol 500 mg to the Percocet, which was also not effective. When he added Ibuprofen 400 mg to the Percocet, he got adequate pain relief, although he reports it only lasts a couple of hours.   He does not yet have an appointment scheduled with [facility]. He reports he called them and when he told them his pain was not controlled they advised him to return to the ER and have urology see him there. He went back to [facility] on 02/18/2019 but reports he left prior to being seen as he had been on a stretcher in the hallway for hours and had not seen anyone.   His additional concern today is that he has not had a bowel movement in a 6 days.   ROS: Negative unless specifically indicated above in HPI.   Relevant past medical history reviewed and updated as indicated.   Allergies and medications reviewed and updated.   Current Outpatient Medications:  .  benazepril (LOTENSIN) 40 MG tablet, Take 1 tablet (40 mg total) by mouth daily. (Needs to be seen before next refill), Disp: 90 tablet, Rfl: 1 .  Cholecalciferol (VITAMIN D PO), Take 1 tablet by mouth daily., Disp: , Rfl:  .  Multiple Vitamin (MULTIVITAMIN WITH MINERALS) TABS tablet, Take 1 tablet by mouth daily., Disp: , Rfl:  .  ondansetron (ZOFRAN) 8 MG tablet, Take 1  tablet (8 mg total) by mouth every 4 (four) hours as needed., Disp: 21 tablet, Rfl: 0 .  oxyCODONE-acetaminophen (PERCOCET) 5-325 MG tablet, Take 1 tablet by mouth every 4 (four) hours as needed., Disp: 20 tablet, Rfl: 0 .  pantoprazole (PROTONIX) 40 MG tablet, TAKE ONE TABLET BY MOUTH ONCE DAILY AS NEEDED FOR ACID REFLUX, Disp: 90 tablet, Rfl: 1 .  pravastatin (PRAVACHOL) 40 MG tablet, Take 1 tablet (40 mg total) by mouth daily., Disp: 90 tablet, Rfl: 1 .  sildenafil (REVATIO) 20 MG tablet, TAKE 2 TO 5 TABLETS BY MOUTH AS NEEDED, Disp: 15 tablet, Rfl: 2 .  tamsulosin (FLOMAX) 0.4 MG CAPS capsule, Take 1 capsule  (0.4 mg total) by mouth daily., Disp: 10 capsule, Rfl: 0 .  triamterene-hydrochlorothiazide (MAXZIDE-25) 37.5-25 MG tablet, Take 1 tablet by mouth daily. (Needs to be seen before next refill), Disp: 90 tablet, Rfl: 1 .  ibuprofen (ADVIL) 600 MG tablet, Take 1 tablet (600 mg total) by mouth every 8 (eight) hours as needed., Disp: 30 tablet, Rfl: 0  No Known Allergies  Objective:   BP 132/75   Pulse (!) 53   Temp (!) 97.1 F (36.2 C) (Oral)   Ht 6\' 2"  (1.88 m)   Wt 256 lb 12.8 oz (116.5 kg)   SpO2 98%   BMI 32.97 kg/m    Physical Exam Vitals signs reviewed.  Constitutional:      General: He is not in acute distress.    Appearance: Normal appearance. He is obese. He is not ill-appearing, toxic-appearing or diaphoretic.  HENT:     Head: Normocephalic and atraumatic.  Eyes:     General: No scleral icterus.       Right eye: No discharge.        Left eye: No discharge.     Conjunctiva/sclera: Conjunctivae normal.  Neck:     Musculoskeletal: Normal range of motion.  Cardiovascular:     Rate and Rhythm: Normal rate and regular rhythm.     Heart sounds: Normal heart sounds. No murmur. No friction rub. No gallop.   Pulmonary:     Effort: Pulmonary effort is normal. No respiratory distress.     Breath sounds: Normal breath sounds. No stridor. No wheezing, rhonchi or rales.  Musculoskeletal: Normal range of motion.  Skin:    General: Skin is warm and dry.  Neurological:     Mental Status: He is alert and oriented to person, place, and time. Mental status is at baseline.  Psychiatric:        Mood and Affect: Mood normal.        Behavior: Behavior normal.        Thought Content: Thought content normal.        Judgment: Judgment normal.

## 2019-02-21 ENCOUNTER — Ambulatory Visit (INDEPENDENT_AMBULATORY_CARE_PROVIDER_SITE_OTHER): Payer: PRIVATE HEALTH INSURANCE | Admitting: Family Medicine

## 2019-02-21 ENCOUNTER — Encounter: Payer: Self-pay | Admitting: Family Medicine

## 2019-02-21 VITALS — BP 132/75 | HR 53 | Temp 97.1°F | Ht 74.0 in | Wt 256.8 lb

## 2019-02-21 DIAGNOSIS — K5903 Drug induced constipation: Secondary | ICD-10-CM | POA: Diagnosis not present

## 2019-02-21 DIAGNOSIS — N200 Calculus of kidney: Secondary | ICD-10-CM | POA: Diagnosis not present

## 2019-02-21 MED ORDER — ONDANSETRON HCL 8 MG PO TABS: 8.0000 mg | ORAL_TABLET | ORAL | 0 refills | Status: DC | PRN

## 2019-02-21 MED ORDER — OXYCODONE-ACETAMINOPHEN 5-325 MG PO TABS: 1.0000 | ORAL_TABLET | ORAL | 0 refills | Status: DC | PRN

## 2019-02-21 MED ORDER — TAMSULOSIN HCL 0.4 MG PO CAPS: 0.4000 mg | ORAL_CAPSULE | Freq: Every day | ORAL | 0 refills | Status: DC

## 2019-02-21 MED ORDER — IBUPROFEN 600 MG PO TABS: 600.0000 mg | ORAL_TABLET | Freq: Three times a day (TID) | ORAL | 0 refills | Status: DC | PRN

## 2019-02-21 NOTE — Patient Instructions (Addendum)
Miralax 1 capful in 6-8 oz beverage of choice once daily for constipation.  Dulcolax suppository per rectum once, then lie down and let it work.  Schedule appointment at [facility].    Dietary Guidelines to Help Prevent Kidney Stones Kidney stones are deposits of minerals and salts that form inside your kidneys. Your risk of developing kidney stones may be greater depending on your diet, your lifestyle, the medicines you take, and whether you have certain medical conditions. Most people can reduce their chances of developing kidney stones by following the instructions below. Depending on your overall health and the type of kidney stones you tend to develop, your dietitian may give you more specific instructions. What are tips for following this plan? Reading food labels  Choose foods with "no salt added" or "low-salt" labels. Limit your sodium intake to less than 1500 mg per day.  Choose foods with calcium for each meal and snack. Try to eat about 300 mg of calcium at each meal. Foods that contain 200-500 mg of calcium per serving include: ? 8 oz (237 ml) of milk, fortified nondairy milk, and fortified fruit juice. ? 8 oz (237 ml) of kefir, yogurt, and soy yogurt. ? 4 oz (118 ml) of tofu. ? 1 oz of cheese. ? 1 cup (300 g) of dried figs. ? 1 cup (91 g) of cooked broccoli. ? 1-3 oz can of sardines or mackerel.  Most people need 1000 to 1500 mg of calcium each day. Talk to your dietitian about how much calcium is recommended for you. Shopping  Buy plenty of fresh fruits and vegetables. Most people do not need to avoid fruits and vegetables, even if they contain nutrients that may contribute to kidney stones.  When shopping for convenience foods, choose: ? Whole pieces of fruit. ? Premade salads with dressing on the side. ? Low-fat fruit and yogurt smoothies.  Avoid buying frozen meals or prepared deli foods.  Look for foods with live cultures, such as yogurt and kefir. Cooking   Do not add salt to food when cooking. Place a salt shaker on the table and allow each person to add his or her own salt to taste.  Use vegetable protein, such as beans, textured vegetable protein (TVP), or tofu instead of meat in pasta, casseroles, and soups. Meal planning   Eat less salt, if told by your dietitian. To do this: ? Avoid eating processed or premade food. ? Avoid eating fast food.  Eat less animal protein, including cheese, meat, poultry, or fish, if told by your dietitian. To do this: ? Limit the number of times you have meat, poultry, fish, or cheese each week. Eat a diet free of meat at least 2 days a week. ? Eat only one serving each day of meat, poultry, fish, or seafood. ? When you prepare animal protein, cut pieces into small portion sizes. For most meat and fish, one serving is about the size of one deck of cards.  Eat at least 5 servings of fresh fruits and vegetables each day. To do this: ? Keep fruits and vegetables on hand for snacks. ? Eat 1 piece of fruit or a handful of berries with breakfast. ? Have a salad and fruit at lunch. ? Have two kinds of vegetables at dinner.  Limit foods that are high in a substance called oxalate. These include: ? Spinach. ? Rhubarb. ? Beets. ? Potato chips and french fries. ? Nuts.  If you regularly take a diuretic medicine, make sure  to eat at least 1-2 fruits or vegetables high in potassium each day. These include: ? Avocado. ? Banana. ? Orange, prune, carrot, or tomato juice. ? Baked potato. ? Cabbage. ? Beans and split peas. General instructions   Drink enough fluid to keep your urine clear or pale yellow. This is the most important thing you can do.  Talk to your health care provider and dietitian about taking daily supplements. Depending on your health and the cause of your kidney stones, you may be advised: ? Not to take supplements with vitamin C. ? To take a calcium supplement. ? To take a daily  probiotic supplement. ? To take other supplements such as magnesium, fish oil, or vitamin B6.  Take all medicines and supplements as told by your health care provider.  Limit alcohol intake to no more than 1 drink a day for nonpregnant women and 2 drinks a day for men. One drink equals 12 oz of beer, 5 oz of wine, or 1 oz of hard liquor.  Lose weight if told by your health care provider. Work with your dietitian to find strategies and an eating plan that works best for you. What foods are not recommended? Limit your intake of the following foods, or as told by your dietitian. Talk to your dietitian about specific foods you should avoid based on the type of kidney stones and your overall health. Grains Breads. Bagels. Rolls. Baked goods. Salted crackers. Cereal. Pasta. Vegetables Spinach. Rhubarb. Beets. Canned vegetables. Bruce Fisher. Olives. Meats and other protein foods Nuts. Nut butters. Large portions of meat, poultry, or fish. Salted or cured meats. Deli meats. Hot dogs. Sausages. Dairy Cheese. Beverages Regular soft drinks. Regular vegetable juice. Seasonings and other foods Seasoning blends with salt. Salad dressings. Canned soups. Soy sauce. Ketchup. Barbecue sauce. Canned pasta sauce. Casseroles. Pizza. Lasagna. Frozen meals. Potato chips. [place] fries. Summary  You can reduce your risk of kidney stones by making changes to your diet.  The most important thing you can do is drink enough fluid. You should drink enough fluid to keep your urine clear or pale yellow.  Ask your health care provider or dietitian how much protein from animal sources you should eat each day, and also how much salt and calcium you should have each day. This information is not intended to replace advice given to you by your health care provider. Make sure you discuss any questions you have with your health care provider. Document Released: 11/05/2010 Document Revised: 10/31/2018 Document Reviewed:  06/21/2016 Elsevier Patient Education  2020 [employer].

## 2019-03-11 NOTE — ED Provider Notes (Signed)
[facility] EMERGENCY DEPARTMENT Provider Note   CSN: [id number] Arrival date & time: 02/18/19  2012    History   Chief Complaint Chief Complaint  Patient presents with  . Back Pain    HPI Bruce Fisher is a 58 y.o. male.     Right flank pain for the past 3 hours without just urinary, hematuria, fever, sweats, chills.  Previous history of kidney stones.  Severity is moderate.  Nothing makes symptoms better or worse.     Past Medical History:  Diagnosis Date  . Hyperlipidemia   . Hypertension   . Kidney stones   . Rectal spasm   . Renal disorder    kidney stones    Patient Active Problem List   Diagnosis Date Noted  . Multiple kidney stones 02/21/2019  . Ventricular bigeminy 10/24/2018  . Grade I diastolic dysfunction 98/92/1194  . GERD (gastroesophageal reflux disease) 05/19/2015  . Erectile dysfunction 03/23/2015  . Mixed hyperlipidemia   . Hypertension     Past Surgical History:  Procedure Laterality Date  . SPINE SURGERY     lower back        Home Medications    Prior to Admission medications   Medication Sig Start Date End Date Taking? Authorizing Provider  benazepril (LOTENSIN) 40 MG tablet Take 1 tablet (40 mg total) by mouth daily. (Needs to be seen before next refill) 10/24/18   [name], MD  Cholecalciferol (VITAMIN D PO) Take 1 tablet by mouth daily.    [provider]  ibuprofen (ADVIL) 600 MG tablet Take 1 tablet (600 mg total) by mouth every 8 (eight) hours as needed. 02/21/19   Loman Brooklyn, FNP  Multiple Vitamin (MULTIVITAMIN WITH MINERALS) TABS tablet Take 1 tablet by mouth daily.    [provider]  ondansetron (ZOFRAN) 8 MG tablet Take 1 tablet (8 mg total) by mouth every 4 (four) hours as needed. 02/21/19   Loman Brooklyn, FNP  oxyCODONE-acetaminophen (PERCOCET) 5-325 MG tablet Take 1 tablet by mouth every 4 (four) hours as needed. 02/21/19   Loman Brooklyn, FNP  pantoprazole (PROTONIX) 40 MG tablet TAKE  ONE TABLET BY MOUTH ONCE DAILY AS NEEDED FOR ACID REFLUX 10/24/18   [name], MD  pravastatin (PRAVACHOL) 40 MG tablet Take 1 tablet (40 mg total) by mouth daily. 10/24/18   [name], MD  sildenafil (REVATIO) 20 MG tablet TAKE 2 TO 5 TABLETS BY MOUTH AS NEEDED 03/07/18   [name], MD  tamsulosin (FLOMAX) 0.4 MG CAPS capsule Take 1 capsule (0.4 mg total) by mouth daily. 02/21/19   Loman Brooklyn, FNP  triamterene-hydrochlorothiazide (MAXZIDE-25) 37.5-25 MG tablet Take 1 tablet by mouth daily. (Needs to be seen before next refill) 10/24/18   [name], MD    Family History Family History  Problem Relation Age of Onset  . Diabetes Mother   . Heart failure Mother   . Cancer Father        Asbestos  . Cancer Sister        breast  . Heart disease Sister        valve    Social History Social History   Tobacco Use  . Smoking status: Former Smoker    Types: Cigarettes    Quit date: 08/19/1993    Years since quitting: 25.5  . Smokeless tobacco: Never Used  Substance Use Topics  . Alcohol use: No    Alcohol/week: 0.0 standard drinks  . Drug use: No  Allergies   Patient has no known allergies.   Review of Systems Review of Systems  All other systems reviewed and are negative.    Physical Exam Updated Vital Signs BP (!) 168/74 (BP Location: Right Arm)   Pulse (!) 35   Temp 99 F (37.2 C) (Oral)   Resp 18   Ht 6\' 2"  (1.88 m)   Wt 118.8 kg   SpO2 97%   BMI 33.64 kg/m   Physical Exam Vitals signs and nursing note reviewed.  Constitutional:      Appearance: He is well-developed.  HENT:     Head: Normocephalic and atraumatic.  Eyes:     Conjunctiva/sclera: Conjunctivae normal.  Neck:     Musculoskeletal: Neck supple.  Cardiovascular:     Rate and Rhythm: Normal rate and regular rhythm.  Pulmonary:     Effort: Pulmonary effort is normal.     Breath sounds: Normal breath sounds.  Abdominal:     General: Bowel sounds are normal.     Palpations:  Abdomen is soft.  Genitourinary:    Comments: Minimal tenderness right flank. Musculoskeletal: Normal range of motion.  Skin:    General: Skin is warm and dry.  Neurological:     Mental Status: He is alert and oriented to person, place, and time.  Psychiatric:        Behavior: Behavior normal.      ED Treatments / Results  Labs (all labs ordered are listed, but only abnormal results are displayed) Labs Reviewed - No data to display  EKG EKG Interpretation  Date/Time:  Monday February 18 2019 20:27:30 EDT Ventricular Rate:  66 PR Interval:  160 QRS Duration: 90 QT Interval:  426 QTC Calculation: 446 R Axis:   4 Text Interpretation:  Sinus rhythm with frequent Premature ventricular complexes Possible Left atrial enlargement Borderline ECG PVCs not seen previously Confirmed by Molpus, John [phone]) on 02/19/2019 11:55:55 AM   Radiology No results found.  Procedures Procedures (including critical care time)  Medications Ordered in ED Medications - No data to display   Initial Impression / Assessment and Plan / ED Course  I have reviewed the triage vital signs and the nursing notes.  Pertinent labs & imaging results that were available during my care of the patient were reviewed by me and considered in my medical decision making (see chart for details).       CT renal reveals 3 calculi in the distal third of the right ureter along with a 4 mm nonobstructing calculus in the lower pole of the collecting system of the right kidney.  Pain management in the ED.  Referral to urology.    Final Clinical Impressions(s) / ED Diagnoses   Final diagnoses:  Lower abdominal pain  History of kidney stones    ED Discharge Orders    None       Nat Christen, MD 03/11/19 2307

## 2019-05-06 ENCOUNTER — Telehealth: Payer: Self-pay | Admitting: *Deleted

## 2019-05-06 NOTE — Telephone Encounter (Signed)
A message was left, re:hi follow up visit. 

## 2019-05-22 ENCOUNTER — Other Ambulatory Visit: Payer: Self-pay | Admitting: Family Medicine

## 2019-05-22 DIAGNOSIS — I10 Essential (primary) hypertension: Secondary | ICD-10-CM

## 2019-05-22 DIAGNOSIS — E782 Mixed hyperlipidemia: Secondary | ICD-10-CM

## 2019-05-23 ENCOUNTER — Telehealth: Payer: Self-pay | Admitting: *Deleted

## 2019-05-23 NOTE — Telephone Encounter (Signed)
Patient aware and verbalizes understanding. 

## 2019-05-23 NOTE — Telephone Encounter (Signed)
LMOVM 30 days refill sent to pharmacy, please call office to make 6 mos appt

## 2019-05-23 NOTE — Telephone Encounter (Signed)
Nasonex is non-preferred by insurance  Preferred are USE GENERIC [place]

## 2019-05-25 ENCOUNTER — Other Ambulatory Visit: Payer: Self-pay | Admitting: Family Medicine

## 2019-05-25 MED ORDER — TRIAMCINOLONE ACETONIDE 55 MCG/ACT NA AERO: 2.0000 | INHALATION_SPRAY | Freq: Every day | NASAL | 12 refills | Status: DC

## 2019-05-25 NOTE — Telephone Encounter (Signed)
I sent in the requested prescription 

## 2019-05-28 DIAGNOSIS — E785 Hyperlipidemia, unspecified: Secondary | ICD-10-CM | POA: Insufficient documentation

## 2019-05-28 DIAGNOSIS — I493 Ventricular premature depolarization: Secondary | ICD-10-CM | POA: Insufficient documentation

## 2019-05-28 NOTE — Progress Notes (Deleted)
Cardiology Office Note   Date:  05/28/2019   ID:  Bruce, Fisher February 02, 1961, MRN [id number]  PCP:  Bruce Fraise, MD  Cardiologist:   No primary care provider on file. Referring:  Bruce Fraise, MD  No chief complaint on file.     History of Present Illness: Bruce Fisher is a 58 y.o. male who is referred by Bruce Fraise, MD for evaluation of PVCs.  After my first visit with him I sent him for a POET (Plain Old Exercise Treadmill).  He was found to have PVCs that got better with exercise.  He had an excellent exercise capacity.  ***    The patient has no past cardiac history.  He was noted this summer to have premature ventricular contractions in a bigeminal pattern.  This was found incidentally on physical.  He actually does not feel this.  He feels quite well.  The patient denies any new symptoms such as chest discomfort, neck or arm discomfort. There has been no new shortness of breath, PND or orthopnea. There have been no reported palpitations, presyncope or syncope.  He is very vigorously active in his job and says he has no symptoms related to this.  I did review the EKG and it was otherwise unremarkable.  Electrolytes in August were unremarkable.  He is never had any other cardiovascular testing.  Past Medical History:  Diagnosis Date  . Hyperlipidemia   . Hypertension   . Kidney stones   . Rectal spasm   . Renal disorder    kidney stones    Past Surgical History:  Procedure Laterality Date  . SPINE SURGERY     lower back     Current Outpatient Medications  Medication Sig Dispense Refill  . benazepril (LOTENSIN) 40 MG tablet Take 1 tablet (40 mg total) by mouth daily. (Needs to be seen before next refill) 30 tablet 0  . Cholecalciferol (VITAMIN D PO) Take 1 tablet by mouth daily.    Marland Kitchen ibuprofen (ADVIL) 600 MG tablet Take 1 tablet (600 mg total) by mouth every 8 (eight) hours as needed. 30 tablet 0  . mometasone (NASONEX) 50 MCG/ACT nasal spray Use 2  spray(s) in each nostril once daily 17 g 0  . Multiple Vitamin (MULTIVITAMIN WITH MINERALS) TABS tablet Take 1 tablet by mouth daily.    . ondansetron (ZOFRAN) 8 MG tablet Take 1 tablet (8 mg total) by mouth every 4 (four) hours as needed. 21 tablet 0  . oxyCODONE-acetaminophen (PERCOCET) 5-325 MG tablet Take 1 tablet by mouth every 4 (four) hours as needed. 20 tablet 0  . pantoprazole (PROTONIX) 40 MG tablet TAKE ONE TABLET BY MOUTH ONCE DAILY AS NEEDED FOR ACID REFLUX 90 tablet 1  . pravastatin (PRAVACHOL) 40 MG tablet Take 1 tablet (40 mg total) by mouth daily. (Needs to be seen before next refill) 30 tablet 0  . sildenafil (REVATIO) 20 MG tablet TAKE 2 TO 5 TABLETS BY MOUTH AS NEEDED 15 tablet 0  . tamsulosin (FLOMAX) 0.4 MG CAPS capsule Take 1 capsule (0.4 mg total) by mouth daily. 10 capsule 0  . triamcinolone (NASACORT) 55 MCG/ACT AERO nasal inhaler Place 2 sprays into the nose daily. 1 Inhaler 12  . triamterene-hydrochlorothiazide (MAXZIDE-25) 37.5-25 MG tablet Take 1 tablet by mouth daily. (Needs to be seen before next refill) 30 tablet 0   No current facility-administered medications for this visit.     Allergies:   Patient has no known allergies.    ROS:  Please see the history of present illness.   Otherwise, review of systems are positive for ***.   All other systems are reviewed and negative.    PHYSICAL EXAM: VS:  There were no vitals taken for this visit. , BMI There is no height or weight on file to calculate BMI. GENERAL:  Well appearing NECK:  No jugular venous distention, waveform within normal limits, carotid upstroke brisk and symmetric, no bruits, no thyromegaly LUNGS:  Clear to auscultation bilaterally CHEST:  Unremarkable HEART:  PMI not displaced or sustained,S1 and S2 within normal limits, no S3, no S4, no clicks, no rubs, *** murmurs ABD:  Flat, positive bowel sounds normal in frequency in pitch, no bruits, no rebound, no guarding, no midline pulsatile mass, no  hepatomegaly, no splenomegaly EXT:  2 plus pulses throughout, no edema, no cyanosis no clubbing    ***GENERAL:  Well appearing HEENT:  Pupils equal round and reactive, fundi not visualized, oral mucosa unremarkable NECK:  No jugular venous distention, waveform within normal limits, carotid upstroke brisk and symmetric, no bruits, no thyromegaly LYMPHATICS:  No cervical, inguinal adenopathy LUNGS:  Clear to auscultation bilaterally BACK:  No CVA tenderness CHEST:  Unremarkable HEART:  PMI not displaced or sustained,S1 and S2 within normal limits, no S3, no S4, no clicks, no rubs, no murmurs ABD:  Flat, positive bowel sounds normal in frequency in pitch, no bruits, no rebound, no guarding, no midline pulsatile mass, no hepatomegaly, no splenomegaly EXT:  2 plus pulses throughout, no edema, no cyanosis no clubbing SKIN:  No rashes no nodules NEURO:  Cranial nerves II through XII grossly intact, motor grossly intact throughout PSYCH:  Cognitively intact, oriented to person place and time    EKG:  EKG is *** ordered today. The rhythm strips *** demonstrates sinus rhythm with ventricular bigeminy, axis within normal limits, intervals within normal limits,   Recent Labs: 02/16/2019: BUN 25; Creatinine, Ser 1.25; Hemoglobin 14.4; Platelets 152; Potassium 4.0; Sodium 139    Lipid Panel    Component Value Date/Time   CHOL 164 03/07/2018 0911   TRIG 116 03/07/2018 0911   TRIG 208 (H) 08/19/2013 1637   HDL 39 (L) 03/07/2018 0911   HDL 42 08/19/2013 1637   CHOLHDL 4.2 03/07/2018 0911   LDLCALC 102 (H) 03/07/2018 0911   LDLCALC 93 08/19/2013 1637      Wt Readings from Last 3 Encounters:  02/21/19 256 lb 12.8 oz (116.5 kg)  02/18/19 261 lb 15.9 oz (118.8 kg)  02/16/19 262 lb (118.8 kg)      Other studies Reviewed: Additional studies/ records that were reviewed today include: *** Review of the above records demonstrates:  Please see elsewhere in the note.     ASSESSMENT AND  PLAN:  VENTRICULAR TACHYCARDIA:   *** He has ventricular ectopy.  He is not really feeling this.  We will check a TSH.  I will order a POET (Plain Old Exercise Treadmill).  Also check an echocardiogram.  If these are normal he is not having any symptoms so I would not consider further testing.  And he would not need treatment.  HTN: His blood pressure is ***not at target.  However, I reviewed previous readings and this is unusual.  I would suggest weight loss and follow-up of his blood pressure readings at home.  If he does not reach target he would need titration of his meds.  DYSLIPIDEMIA:  ***  LDL was slightly elevated but in the absence of known coronary disease  I would continue with aggressive risk factor modification with weight loss diet and exercise  Current medicines are reviewed at length with the patient today.  The patient does not have concerns regarding medicines.  The following changes have been made:  ***  Labs/ tests ordered today include: ***  No orders of the defined types were placed in this encounter.    Disposition:   FU with me ***   Signed, Minus Breeding, MD  05/28/2019 9:29 PM    [place]

## 2019-05-29 ENCOUNTER — Ambulatory Visit: Payer: PRIVATE HEALTH INSURANCE | Admitting: Cardiology

## 2019-06-25 ENCOUNTER — Encounter: Payer: Self-pay | Admitting: Family Medicine

## 2019-06-25 ENCOUNTER — Ambulatory Visit (INDEPENDENT_AMBULATORY_CARE_PROVIDER_SITE_OTHER): Payer: PRIVATE HEALTH INSURANCE | Admitting: Family Medicine

## 2019-06-25 DIAGNOSIS — E785 Hyperlipidemia, unspecified: Secondary | ICD-10-CM | POA: Diagnosis not present

## 2019-06-25 DIAGNOSIS — I10 Essential (primary) hypertension: Secondary | ICD-10-CM | POA: Diagnosis not present

## 2019-06-25 DIAGNOSIS — K219 Gastro-esophageal reflux disease without esophagitis: Secondary | ICD-10-CM

## 2019-06-25 NOTE — Progress Notes (Signed)
Subjective:    Patient ID: Bruce Fisher, male    DOB: [birth year], 58 y.o.   MRN: [id number]   HPI: Bruce Fisher is a 58 y.o. male presenting for  presents for  follow-up of hypertension. Patient has no history of headache chest pain or shortness of breath or recent cough. Patient also denies symptoms of TIA such as focal numbness or weakness. Patient denies side effects from medication. States taking it regularly.Son checks for him and all readings stay under 135/85.    in for follow-up of elevated cholesterol. Doing well without complaints on current medication. Denies side effects of statin including myalgia and arthralgia and nausea. Currently no chest pain, shortness of breath or other cardiovascular related symptoms noted.  Patient in for follow-up of GERD. Currently asymptomatic taking  PPI prn about once a month now as sx have cleared. There is no chest pain or heartburn. No hematemesis and no melena. No dysphagia or choking. Onset is remote. Progression is stable. Complicating factors, none.   Depression screen [facility] 2/9 02/21/2019 03/07/2018 08/21/2017 09/27/2016 03/21/2016  Decreased Interest 0 0 0 0 0  Down, Depressed, Hopeless 0 0 0 0 0  PHQ - 2 Score 0 0 0 0 0     Relevant past medical, surgical, family and social history reviewed and updated as indicated.  Interim medical history since our last visit reviewed. Allergies and medications reviewed and updated.  ROS:  Review of Systems  Constitutional: Negative.   HENT: Negative.   Eyes: Negative for visual disturbance.  Respiratory: Negative for cough and shortness of breath.   Cardiovascular: Negative for chest pain and leg swelling.  Gastrointestinal: Negative for abdominal pain, diarrhea, nausea and vomiting.  Genitourinary: Negative for difficulty urinating.  Musculoskeletal: Negative for arthralgias and myalgias.  Skin: Negative for rash.  Neurological: Negative for headaches.  Psychiatric/Behavioral: Negative for  sleep disturbance.     Social History   Tobacco Use  Smoking Status Former Smoker  . Types: Cigarettes  . Quit date: 08/19/1993  . Years since quitting: 25.8  Smokeless Tobacco Never Used       Objective:     Wt Readings from Last 3 Encounters:  02/21/19 256 lb 12.8 oz (116.5 kg)  02/18/19 261 lb 15.9 oz (118.8 kg)  02/16/19 262 lb (118.8 kg)     Exam deferred. Pt. Harboring due to COVID 19. Phone visit performed.   Assessment & Plan:   1. Dyslipidemia   2. Essential hypertension   3. Gastroesophageal reflux disease without esophagitis     No orders of the defined types were placed in this encounter.   Orders Placed This Encounter  Procedures  . CBC  . CMP    Order Specific Question:   Has the patient fasted?    Answer:   Yes  . Lipid    Order Specific Question:   Has the patient fasted?    Answer:   Yes      Diagnoses and all orders for this visit:  Dyslipidemia -     CBC -     CMP -     Lipid  Essential hypertension -     CBC -     CMP  Gastroesophageal reflux disease without esophagitis -     CBC -     CMP    Virtual Visit via telephone Note  I discussed the limitations, risks, security and privacy concerns of performing an evaluation and management service by telephone and the availability of  in person appointments. The patient was identified with two identifiers. Pt.expressed understanding and agreed to proceed. Pt. Is at home. Dr. Livia Snellen is in his office.  Follow Up Instructions:   I discussed the assessment and treatment plan with the patient. The patient was provided an opportunity to ask questions and all were answered. The patient agreed with the plan and demonstrated an understanding of the instructions.   The patient was advised to call back or seek an in-person evaluation if the symptoms worsen or if the condition fails to improve as anticipated.   Total minutes including chart review and phone contact time: 13   Follow up  plan: Return in about 6 months (around 12/24/2019).  [name], MD [place]

## 2019-06-27 ENCOUNTER — Other Ambulatory Visit: Payer: Self-pay | Admitting: Family Medicine

## 2019-06-27 DIAGNOSIS — K2100 Gastro-esophageal reflux disease with esophagitis, without bleeding: Secondary | ICD-10-CM

## 2019-06-27 DIAGNOSIS — I10 Essential (primary) hypertension: Secondary | ICD-10-CM

## 2019-06-27 DIAGNOSIS — N200 Calculus of kidney: Secondary | ICD-10-CM

## 2019-06-27 DIAGNOSIS — E782 Mixed hyperlipidemia: Secondary | ICD-10-CM

## 2019-06-27 MED ORDER — TRIAMTERENE-HCTZ 37.5-25 MG PO TABS: 1.0000 | ORAL_TABLET | Freq: Every day | ORAL | 1 refills | Status: DC

## 2019-06-27 MED ORDER — PRAVASTATIN SODIUM 40 MG PO TABS: 40.0000 mg | ORAL_TABLET | Freq: Every day | ORAL | 1 refills | Status: DC

## 2019-06-27 MED ORDER — SILDENAFIL CITRATE 20 MG PO TABS: ORAL_TABLET | ORAL | 3 refills | Status: DC

## 2019-06-27 MED ORDER — PANTOPRAZOLE SODIUM 40 MG PO TBEC: DELAYED_RELEASE_TABLET | ORAL | 1 refills | Status: DC

## 2019-06-27 MED ORDER — BENAZEPRIL HCL 40 MG PO TABS: 40.0000 mg | ORAL_TABLET | Freq: Every day | ORAL | 1 refills | Status: DC

## 2019-06-27 NOTE — Telephone Encounter (Signed)
Refills sent to pharmacy as requested and pt is aware.

## 2019-07-03 NOTE — Progress Notes (Signed)
Cardiology Office Note   Date:  07/04/2019   ID:  Carmen, Howze 1961-05-08, MRN [id number]  PCP:  [name], MD  Cardiologist:   No primary care provider on file. Referring:  [name], MD  Chief Complaint  Patient presents with  . PVCs      History of Present Illness: Bruce Fisher is a 58 y.o. male who is referred by [name], MD for evaluation of PVCs. He had a low risk POET (Plain Old Exercise Treadmill).  EF demonstrated a low normal EF.   Since I last saw him he has been okay.  He has frequent ventricular ectopy but he does not feel this. The patient denies any new symptoms such as chest discomfort, neck or arm discomfort. There has been no new shortness of breath, PND or orthopnea. There have been no reported palpitations, presyncope or syncope.    Past Medical History:  Diagnosis Date  . Hyperlipidemia   . Hypertension   . Kidney stones   . Rectal spasm   . Renal disorder    kidney stones    Past Surgical History:  Procedure Laterality Date  . SPINE SURGERY     lower back     Current Outpatient Medications  Medication Sig Dispense Refill  . benazepril (LOTENSIN) 40 MG tablet Take 1 tablet (40 mg total) by mouth daily. 90 tablet 1  . Cholecalciferol (VITAMIN D PO) Take 1 tablet by mouth daily.    . Multiple Vitamin (MULTIVITAMIN WITH MINERALS) TABS tablet Take 1 tablet by mouth daily.    . tamsulosin (FLOMAX) 0.4 MG CAPS capsule Take 1 capsule (0.4 mg total) by mouth daily. 10 capsule 0  . triamcinolone (NASACORT) 55 MCG/ACT AERO nasal inhaler Place 2 sprays into the nose daily. 1 Inhaler 12  . triamterene-hydrochlorothiazide (MAXZIDE-25) 37.5-25 MG tablet Take 1 tablet by mouth daily. 90 tablet 1  . amLODipine (NORVASC) 2.5 MG tablet Take 1 tablet (2.5 mg total) by mouth daily. 90 tablet 3   No current facility-administered medications for this visit.    Allergies:   Patient has no known allergies.    ROS:  Please see the  history of present illness.   Otherwise, review of systems are positive for none.   All other systems are reviewed and negative.    PHYSICAL EXAM: VS:  BP (!) 168/75   Pulse 78   Temp 98.4 F (36.9 C)   Ht 6\' 2"  (1.88 m)   Wt 271 lb 6.4 oz (123.1 kg)   SpO2 98%   BMI 34.85 kg/m  , BMI Body mass index is 34.85 kg/m. GENERAL:  Well appearing NECK:  No jugular venous distention, waveform within normal limits, carotid upstroke brisk and symmetric, no bruits, no thyromegaly LUNGS:  Clear to auscultation bilaterally CHEST:  Unremarkable HEART:  PMI not displaced or sustained,S1 and S2 within normal limits, no S3, no S4, no clicks, no rubs, no murmurs ABD:  Flat, positive bowel sounds normal in frequency in pitch, no bruits, no rebound, no guarding, no midline pulsatile mass, no hepatomegaly, no splenomegaly EXT:  2 plus pulses throughout, no edema, no cyanosis no clubbing   EKG:  EKG is not ordered today.   Recent Labs: 02/16/2019: BUN 25; Creatinine, Ser 1.25; Hemoglobin 14.4; Platelets 152; Potassium 4.0; Sodium 139    Lipid Panel    Component Value Date/Time   CHOL 164 03/07/2018 0911   TRIG 116 03/07/2018 0911   TRIG 208 (H) 08/19/2013 1637  HDL 39 (L) 03/07/2018 0911   HDL 42 08/19/2013 1637   CHOLHDL 4.2 03/07/2018 0911   LDLCALC 102 (H) 03/07/2018 0911   LDLCALC 93 08/19/2013 1637      Wt Readings from Last 3 Encounters:  07/04/19 271 lb 6.4 oz (123.1 kg)  02/21/19 256 lb 12.8 oz (116.5 kg)  02/18/19 261 lb 15.9 oz (118.8 kg)      Other studies Reviewed: Additional studies/ records that were reviewed today include: None Review of the above records demonstrates:  Please see elsewhere in the note.     ASSESSMENT AND PLAN:  VENTRICULAR  ECTOPY:      He does not notice this.  No change in therapy.   I would suggest perhaps getting a TSH next time he has blood work.  His TSH previously been normal.  HTN: His blood pressure is not at target.  Again and  amlodipine 2.5 mg daily.  We talked about weight loss and salt restriction as well.  He will bring me a blood pressure diary.    Current medicines are reviewed at length with the patient today.  The patient does not have concerns regarding medicines.  The following changes have been made:  None  Labs/ tests ordered today include: None  No orders of the defined types were placed in this encounter.    Disposition:   FU with me as needed.     Signed, Minus Breeding, MD  07/04/2019 6:12 PM    [facility]

## 2019-07-04 ENCOUNTER — Other Ambulatory Visit: Payer: Self-pay

## 2019-07-04 ENCOUNTER — Ambulatory Visit (INDEPENDENT_AMBULATORY_CARE_PROVIDER_SITE_OTHER): Payer: PRIVATE HEALTH INSURANCE | Admitting: Cardiology

## 2019-07-04 ENCOUNTER — Encounter: Payer: Self-pay | Admitting: Cardiology

## 2019-07-04 DIAGNOSIS — I10 Essential (primary) hypertension: Secondary | ICD-10-CM

## 2019-07-04 MED ORDER — AMLODIPINE BESYLATE 2.5 MG PO TABS: 2.5000 mg | ORAL_TABLET | Freq: Every day | ORAL | 3 refills | Status: DC

## 2019-07-04 MED ORDER — TRIAMTERENE-HCTZ 37.5-25 MG PO TABS: 1.0000 | ORAL_TABLET | Freq: Every day | ORAL | 1 refills | Status: DC

## 2019-07-04 NOTE — Patient Instructions (Signed)
Medication Instructions:  Your physician has recommended you make the following change in your medication:   START AMLODIPINE (NORVASC) 2.5 MG BY MOUTH DAILY  *If you need a refill on your cardiac medications before your next appointment, please call your pharmacy*  Lab Work: NONE If you have labs (blood work) drawn today and your tests are completely normal, you will receive your results only by: Marland Kitchen MyChart Message (if you have MyChart) OR . A paper copy in the mail If you have any lab test that is abnormal or we need to change your treatment, we will call you to review the results.  Testing/Procedures: NONE

## 2019-10-24 ENCOUNTER — Ambulatory Visit: Payer: Self-pay | Attending: Internal Medicine

## 2019-10-24 DIAGNOSIS — Z23 Encounter for immunization: Secondary | ICD-10-CM

## 2019-10-24 NOTE — Progress Notes (Signed)
   Covid-19 Vaccination Clinic  Name:  Bruce Fisher    MRN: [id number] DOB: 1960/10/25  10/24/2019  Bruce Fisher was observed post Covid-19 immunization for 15 minutes without incident. He was provided with Vaccine Information Sheet and instruction to access the V-Safe system.   Bruce Fisher was instructed to call 911 with any severe reactions post vaccine: Marland Kitchen Difficulty breathing  . Swelling of face and throat  . A fast heartbeat  . A bad rash all over body  . Dizziness and weakness   Immunizations Administered    Name Date Dose VIS Date Route   Moderna COVID-19 Vaccine 10/24/2019  9:56 AM 0.5 mL 06/25/2019 Intramuscular   Manufacturer: Moderna   Lot: [id number]   [id number]

## 2019-11-21 ENCOUNTER — Ambulatory Visit: Payer: Self-pay | Attending: Internal Medicine

## 2019-11-21 DIAGNOSIS — Z23 Encounter for immunization: Secondary | ICD-10-CM

## 2019-11-21 NOTE — Progress Notes (Signed)
   Covid-19 Vaccination Clinic  Name:  Bruce Fisher    MRN: [id number] DOB: 15-Jun-1961  11/21/2019  Mr. Delfierro was observed post Covid-19 immunization for 15 minutes without incident. He was provided with Vaccine Information Sheet and instruction to access the V-Safe system.   Mr. Pickell was instructed to call 911 with any severe reactions post vaccine: Marland Kitchen Difficulty breathing  . Swelling of face and throat  . A fast heartbeat  . A bad rash all over body  . Dizziness and weakness   Immunizations Administered    Name Date Dose VIS Date Route   Moderna COVID-19 Vaccine 11/21/2019  9:59 AM 0.5 mL 06/2019 Intramuscular   Manufacturer: Moderna   Lot: [id number]   [id number]

## 2019-12-30 ENCOUNTER — Other Ambulatory Visit: Payer: Self-pay | Admitting: Family Medicine

## 2019-12-30 DIAGNOSIS — I10 Essential (primary) hypertension: Secondary | ICD-10-CM

## 2019-12-30 DIAGNOSIS — E782 Mixed hyperlipidemia: Secondary | ICD-10-CM

## 2020-01-30 ENCOUNTER — Other Ambulatory Visit: Payer: Self-pay | Admitting: Family Medicine

## 2020-01-30 DIAGNOSIS — E782 Mixed hyperlipidemia: Secondary | ICD-10-CM

## 2020-01-30 DIAGNOSIS — I10 Essential (primary) hypertension: Secondary | ICD-10-CM

## 2020-01-31 ENCOUNTER — Other Ambulatory Visit: Payer: Self-pay | Admitting: Family Medicine

## 2020-01-31 DIAGNOSIS — I10 Essential (primary) hypertension: Secondary | ICD-10-CM

## 2020-01-31 DIAGNOSIS — E782 Mixed hyperlipidemia: Secondary | ICD-10-CM

## 2020-01-31 MED ORDER — PRAVASTATIN SODIUM 40 MG PO TABS: 40.0000 mg | ORAL_TABLET | Freq: Every day | ORAL | 0 refills | Status: DC

## 2020-01-31 MED ORDER — BENAZEPRIL HCL 40 MG PO TABS: 40.0000 mg | ORAL_TABLET | Freq: Every day | ORAL | 0 refills | Status: DC

## 2020-01-31 NOTE — Telephone Encounter (Signed)
Medication refilled.  Patient has an upcoming appointment on 02/11/2020.  Patient aware.

## 2020-01-31 NOTE — Telephone Encounter (Signed)
Last OV 07/04/19. Last RF 12/30/19 30 day supply. Next OV not scheduled  Refill denied   ntbs for further refills

## 2020-01-31 NOTE — Telephone Encounter (Signed)
  Prescription Request  01/31/2020  What is the name of the medication or equipment?  benazepril (LOTENSIN) 40 MG tablet pravastatin (PRAVACHOL) 40 MG tablet  Have you contacted your pharmacy to request a refill? (if applicable) yes but refused because pt needs an apt--he did schedule for 02/11/2020 at 425 with [facility], but he needs a refill until then because he is completely.  Which pharmacy would you like this sent to? [facility]   Patient notified that their request is being sent to the clinical staff for review and that they should receive a response within 2 business days.

## 2020-02-11 ENCOUNTER — Ambulatory Visit: Payer: PRIVATE HEALTH INSURANCE | Admitting: Family Medicine

## 2020-02-14 ENCOUNTER — Encounter: Payer: Self-pay | Admitting: Family Medicine

## 2020-03-04 ENCOUNTER — Other Ambulatory Visit: Payer: Self-pay | Admitting: Family Medicine

## 2020-03-04 DIAGNOSIS — E782 Mixed hyperlipidemia: Secondary | ICD-10-CM

## 2020-03-19 ENCOUNTER — Ambulatory Visit: Payer: Self-pay | Admitting: Family Medicine

## 2020-03-20 ENCOUNTER — Other Ambulatory Visit: Payer: Self-pay | Admitting: Family Medicine

## 2020-03-20 NOTE — Telephone Encounter (Signed)
  Prescription Request  03/20/2020  What is the name of the medication or equipment? benazepril (LOTENSIN) 40 MG tablet,amLODipine (NORVASC) 2.5 MG tablet, triamcinolone (NASACORT) 55 MCG/ACT AERO, and pravastatin (PRAVACHOL) 40 MG tablet  Have you contacted your pharmacy to request a refill? (if applicable) no, pt had appt with [facility] on 08/26 but [facility] went home sick. He will be out of meds before next appointment  Which pharmacy would you like this sent to? [facility]   Patient notified that their request is being sent to the clinical staff for review and that they should receive a response within 2 business days.

## 2020-03-23 ENCOUNTER — Other Ambulatory Visit: Payer: Self-pay | Admitting: Family Medicine

## 2020-03-23 DIAGNOSIS — I10 Essential (primary) hypertension: Secondary | ICD-10-CM

## 2020-03-23 DIAGNOSIS — E782 Mixed hyperlipidemia: Secondary | ICD-10-CM

## 2020-03-23 MED ORDER — AMLODIPINE BESYLATE 2.5 MG PO TABS: 2.5000 mg | ORAL_TABLET | Freq: Every day | ORAL | 0 refills | Status: DC

## 2020-03-23 MED ORDER — TRIAMCINOLONE ACETONIDE 55 MCG/ACT NA AERO: 2.0000 | INHALATION_SPRAY | Freq: Every day | NASAL | 0 refills | Status: DC

## 2020-03-23 NOTE — Telephone Encounter (Signed)
Pt aware refills sent to pharmacy 

## 2020-03-23 NOTE — Telephone Encounter (Signed)
Pt checking status on med refill

## 2020-03-31 ENCOUNTER — Encounter: Payer: Self-pay | Admitting: Family Medicine

## 2020-03-31 ENCOUNTER — Other Ambulatory Visit: Payer: Self-pay

## 2020-03-31 ENCOUNTER — Ambulatory Visit (INDEPENDENT_AMBULATORY_CARE_PROVIDER_SITE_OTHER): Payer: 59 | Admitting: Family Medicine

## 2020-03-31 VITALS — BP 160/82 | HR 35 | Temp 98.2°F | Ht 74.0 in | Wt 273.0 lb

## 2020-03-31 DIAGNOSIS — K219 Gastro-esophageal reflux disease without esophagitis: Secondary | ICD-10-CM

## 2020-03-31 DIAGNOSIS — R001 Bradycardia, unspecified: Secondary | ICD-10-CM

## 2020-03-31 DIAGNOSIS — E782 Mixed hyperlipidemia: Secondary | ICD-10-CM

## 2020-03-31 DIAGNOSIS — I10 Essential (primary) hypertension: Secondary | ICD-10-CM | POA: Diagnosis not present

## 2020-03-31 DIAGNOSIS — E559 Vitamin D deficiency, unspecified: Secondary | ICD-10-CM

## 2020-03-31 DIAGNOSIS — Z125 Encounter for screening for malignant neoplasm of prostate: Secondary | ICD-10-CM

## 2020-03-31 MED ORDER — TRIAMTERENE-HCTZ 37.5-25 MG PO TABS: 1.0000 | ORAL_TABLET | Freq: Every day | ORAL | 1 refills | Status: DC

## 2020-03-31 MED ORDER — BENAZEPRIL HCL 40 MG PO TABS: 40.0000 mg | ORAL_TABLET | Freq: Every day | ORAL | 1 refills | Status: DC

## 2020-03-31 MED ORDER — AMLODIPINE BESYLATE 5 MG PO TABS: 5.0000 mg | ORAL_TABLET | Freq: Every day | ORAL | 1 refills | Status: DC

## 2020-03-31 MED ORDER — PRAVASTATIN SODIUM 40 MG PO TABS: 40.0000 mg | ORAL_TABLET | Freq: Every day | ORAL | 1 refills | Status: DC

## 2020-03-31 NOTE — Progress Notes (Signed)
Subjective:  Patient ID: Bruce Fisher, male    DOB: Nov 19, 1960  Age: 59 y.o. MRN: [id number]  CC: Follow-up   HPI Bruce Fisher presents for follow-up of elevated cholesterol. Doing well without complaints on current medication. Denies side effects of statin including myalgia and arthralgia and nausea. Also in today for liver function testing. Currently no chest pain, shortness of breath or other cardiovascular related symptoms noted.   presents for  follow-up of hypertension. Patient has no history of headache chest pain or shortness of breath or recent cough. Patient also denies symptoms of TIA such as focal numbness or weakness. Patient denies side effects from medication. States taking it regularly.    History Bruce Fisher has a past medical history of Hyperlipidemia, Hypertension, Kidney stones, Rectal spasm, and Renal disorder.   He has a past surgical history that includes Spine surgery.   His family history includes Cancer in his father and sister; Diabetes in his mother; Heart disease in his sister; Heart failure in his mother.He reports that he quit smoking about 26 years ago. His smoking use included cigarettes. He has never used smokeless tobacco. He reports that he does not drink alcohol and does not use drugs.  Current Outpatient Medications on File Prior to Visit  Medication Sig Dispense Refill  . Cholecalciferol (VITAMIN D PO) Take 1 tablet by mouth daily.    . Multiple Vitamin (MULTIVITAMIN WITH MINERALS) TABS tablet Take 1 tablet by mouth daily.    Marland Kitchen triamcinolone (NASACORT) 55 MCG/ACT AERO nasal inhaler Place 2 sprays into the nose daily. 6.8 mL 0   No current facility-administered medications on file prior to visit.    ROS Review of Systems  Constitutional: Negative.   HENT: Negative.   Eyes: Negative for visual disturbance.  Respiratory: Negative for cough and shortness of breath.   Cardiovascular: Negative for chest pain and leg swelling.  Gastrointestinal:  Negative for abdominal pain, diarrhea, nausea and vomiting.  Genitourinary: Negative for difficulty urinating.  Musculoskeletal: Negative for arthralgias.  Skin: Negative for rash.  Neurological: Negative for headaches.  Psychiatric/Behavioral: Negative for sleep disturbance.    Objective:  BP (!) 160/82   Pulse (!) 35   Temp 98.2 F (36.8 C) (Temporal)   Ht '6\' 2"'  (1.88 m)   Wt 273 lb (123.8 kg)   BMI 35.05 kg/m   BP Readings from Last 3 Encounters:  03/31/20 (!) 160/82  07/04/19 (!) 168/75  02/21/19 132/75    Wt Readings from Last 3 Encounters:  03/31/20 273 lb (123.8 kg)  07/04/19 271 lb 6.4 oz (123.1 kg)  02/21/19 256 lb 12.8 oz (116.5 kg)     Physical Exam Vitals reviewed.  Constitutional:      Appearance: He is well-developed.  HENT:     Head: Normocephalic and atraumatic.     Right Ear: Tympanic membrane and external ear normal. No decreased hearing noted.     Left Ear: Tympanic membrane and external ear normal. No decreased hearing noted.     Mouth/Throat:     Pharynx: No oropharyngeal exudate or posterior oropharyngeal erythema.  Eyes:     Pupils: Pupils are equal, round, and reactive to light.  Cardiovascular:     Rate and Rhythm: Regular rhythm. Bradycardia present.     Heart sounds: No murmur heard.   Pulmonary:     Effort: No respiratory distress.     Breath sounds: Normal breath sounds.  Abdominal:     General: Bowel sounds are normal.  Palpations: Abdomen is soft. There is no mass.     Tenderness: There is no abdominal tenderness.  Musculoskeletal:     Cervical back: Normal range of motion and neck supple.   EKG performed today shows ventricular bigeminy with bradycardia.  No results found for: HGBA1C  Lab Results  Component Value Date   WBC 9.4 02/16/2019   HGB 14.4 02/16/2019   HCT 44.7 02/16/2019   PLT 152 02/16/2019   GLUCOSE 167 (H) 02/16/2019   CHOL 164 03/07/2018   TRIG 116 03/07/2018   HDL 39 (L) 03/07/2018   LDLCALC 102  (H) 03/07/2018   ALT 18 03/07/2018   AST 18 03/07/2018   NA 139 02/16/2019   K 4.0 02/16/2019   CL 107 02/16/2019   CREATININE 1.25 (H) 02/16/2019   BUN 25 (H) 02/16/2019   CO2 26 02/16/2019   TSH 1.000 03/23/2015   PSA 1.4 08/19/2013    No results found.  Assessment & Plan:   Bruce Fisher was seen today for follow-up.  Diagnoses and all orders for this visit:  Mixed hyperlipidemia -     pravastatin (PRAVACHOL) 40 MG tablet; Take 1 tablet (40 mg total) by mouth daily. -     CBC with Differential/Platelet -     CMP14+EGFR -     Lipid panel  Essential hypertension -     benazepril (LOTENSIN) 40 MG tablet; Take 1 tablet (40 mg total) by mouth daily. -     triamterene-hydrochlorothiazide (MAXZIDE-25) 37.5-25 MG tablet; Take 1 tablet by mouth daily. -     CBC with Differential/Platelet -     CMP14+EGFR  Gastroesophageal reflux disease without esophagitis -     CBC with Differential/Platelet -     CMP14+EGFR  Bradycardia -     EKG 12-Lead -     CBC with Differential/Platelet -     CMP14+EGFR  Screening for prostate cancer -     CBC with Differential/Platelet -     CMP14+EGFR -     PSA Total (Reflex To Free)  Vitamin D deficiency -     CBC with Differential/Platelet -     CMP14+EGFR -     VITAMIN D 25 Hydroxy (Vit-D Deficiency, Fractures)  Other orders -     amLODipine (NORVASC) 5 MG tablet; Take 1 tablet (5 mg total) by mouth daily.   I have discontinued Bruce Fisher's tamsulosin. I have also changed his amLODipine and benazepril. Additionally, I am having him maintain his multivitamin with minerals, Cholecalciferol (VITAMIN D PO), triamcinolone, pravastatin, and triamterene-hydrochlorothiazide.  Meds ordered this encounter  Medications  . amLODipine (NORVASC) 5 MG tablet    Sig: Take 1 tablet (5 mg total) by mouth daily.    Dispense:  90 tablet    Refill:  1  . benazepril (LOTENSIN) 40 MG tablet    Sig: Take 1 tablet (40 mg total) by mouth daily.    Dispense:   90 tablet    Refill:  1  . pravastatin (PRAVACHOL) 40 MG tablet    Sig: Take 1 tablet (40 mg total) by mouth daily.    Dispense:  90 tablet    Refill:  1  . triamterene-hydrochlorothiazide (MAXZIDE-25) 37.5-25 MG tablet    Sig: Take 1 tablet by mouth daily.    Dispense:  90 tablet    Refill:  1     Follow-up: Return in about 6 months (around 09/28/2020).  [name], M.D.

## 2020-04-01 ENCOUNTER — Other Ambulatory Visit: Payer: Self-pay | Admitting: Family Medicine

## 2020-04-01 LAB — CMP14+EGFR
ALT: 20 IU/L (ref 0–44)
AST: 18 IU/L (ref 0–40)
Albumin/Globulin Ratio: 1.3 (ref 1.2–2.2)
Albumin: 4.5 g/dL (ref 3.8–4.9)
Alkaline Phosphatase: 65 IU/L (ref 48–121)
BUN/Creatinine Ratio: 13 (ref 9–20)
BUN: 13 mg/dL (ref 6–24)
Bilirubin Total: 0.3 mg/dL (ref 0.0–1.2)
CO2: 23 mmol/L (ref 20–29)
Calcium: 9.7 mg/dL (ref 8.7–10.2)
Chloride: 102 mmol/L (ref 96–106)
Creatinine, Ser: 0.97 mg/dL (ref 0.76–1.27)
GFR calc Af Amer: 100 mL/min/{1.73_m2} (ref >59)
GFR calc non Af Amer: 86 mL/min/{1.73_m2} (ref >59)
Globulin, Total: 3.6 g/dL (ref 1.5–4.5)
Glucose: 120 mg/dL — ABNORMAL HIGH (ref 65–99)
Potassium: 3.8 mmol/L (ref 3.5–5.2)
Sodium: 140 mmol/L (ref 134–144)
Total Protein: 8.1 g/dL (ref 6.0–8.5)

## 2020-04-01 LAB — CBC WITH DIFFERENTIAL/PLATELET
Basophils Absolute: 0 10*3/uL (ref 0.0–0.2)
Basos: 0 %
EOS (ABSOLUTE): 0.1 10*3/uL (ref 0.0–0.4)
Eos: 2 %
Hematocrit: 45 % (ref 37.5–51.0)
Hemoglobin: 14.6 g/dL (ref 13.0–17.7)
Immature Grans (Abs): 0 10*3/uL (ref 0.0–0.1)
Immature Granulocytes: 0 %
Lymphocytes Absolute: 2.4 10*3/uL (ref 0.7–3.1)
Lymphs: 32 %
MCH: 28.9 pg (ref 26.6–33.0)
MCHC: 32.4 g/dL (ref 31.5–35.7)
MCV: 89 fL (ref 79–97)
Monocytes Absolute: 0.7 10*3/uL (ref 0.1–0.9)
Monocytes: 9 %
Neutrophils Absolute: 4.3 10*3/uL (ref 1.4–7.0)
Neutrophils: 57 %
Platelets: 191 10*3/uL (ref 150–450)
RBC: 5.05 x10E6/uL (ref 4.14–5.80)
RDW: 14 % (ref 11.6–15.4)
WBC: 7.5 10*3/uL (ref 3.4–10.8)

## 2020-04-01 LAB — PSA TOTAL (REFLEX TO FREE): Prostate Specific Ag, Serum: 2.3 ng/mL (ref 0.0–4.0)

## 2020-04-01 LAB — LIPID PANEL
Chol/HDL Ratio: 5.1 ratio — ABNORMAL HIGH (ref 0.0–5.0)
Cholesterol, Total: 178 mg/dL (ref 100–199)
HDL: 35 mg/dL — ABNORMAL LOW (ref >39)
LDL Chol Calc (NIH): 106 mg/dL — ABNORMAL HIGH (ref 0–99)
Triglycerides: 211 mg/dL — ABNORMAL HIGH (ref 0–149)
VLDL Cholesterol Cal: 37 mg/dL (ref 5–40)

## 2020-04-01 LAB — VITAMIN D 25 HYDROXY (VIT D DEFICIENCY, FRACTURES): Vit D, 25-Hydroxy: 33.9 ng/mL (ref 30.0–100.0)

## 2020-04-01 MED ORDER — ROSUVASTATIN CALCIUM 20 MG PO TABS: 20.0000 mg | ORAL_TABLET | Freq: Every day | ORAL | 1 refills | Status: DC

## 2020-08-03 ENCOUNTER — Other Ambulatory Visit: Payer: Self-pay

## 2020-08-03 ENCOUNTER — Encounter (HOSPITAL_COMMUNITY): Payer: Self-pay | Admitting: Emergency Medicine

## 2020-08-03 ENCOUNTER — Telehealth: Payer: Self-pay

## 2020-08-03 DIAGNOSIS — I10 Essential (primary) hypertension: Secondary | ICD-10-CM | POA: Insufficient documentation

## 2020-08-03 DIAGNOSIS — R739 Hyperglycemia, unspecified: Secondary | ICD-10-CM | POA: Diagnosis not present

## 2020-08-03 DIAGNOSIS — Z79899 Other long term (current) drug therapy: Secondary | ICD-10-CM | POA: Diagnosis not present

## 2020-08-03 DIAGNOSIS — Z87891 Personal history of nicotine dependence: Secondary | ICD-10-CM | POA: Diagnosis not present

## 2020-08-03 LAB — URINALYSIS, ROUTINE W REFLEX MICROSCOPIC
Bacteria, UA: NONE SEEN
Bilirubin Urine: NEGATIVE
Glucose, UA: >=500 mg/dL — AB
Hgb urine dipstick: NEGATIVE
Ketones, ur: 5 mg/dL — AB
Leukocytes,Ua: NEGATIVE
Nitrite: NEGATIVE
Protein, ur: 100 mg/dL — AB
Specific Gravity, Urine: 1.032 — ABNORMAL HIGH (ref 1.005–1.030)
pH: 5 (ref 5.0–8.0)

## 2020-08-03 LAB — CBG MONITORING, ED: Glucose-Capillary: 313 mg/dL — ABNORMAL HIGH (ref 70–99)

## 2020-08-03 NOTE — ED Triage Notes (Signed)
Pt sent to the ED per PCP for evaluation for Hyperglycemia.  Pt states his CBGs have been in the 300's since the weekend.  Denies abdominal pain, or N/V.

## 2020-08-03 NOTE — Telephone Encounter (Signed)
HE should go to the E.D. if having any unusual sx such as nausea, feeling mentallly foggy, etc. Avod carbs and Arrange appt here first available with me, also.

## 2020-08-03 NOTE — Telephone Encounter (Signed)
Patient aware and verbalized understanding. °

## 2020-08-03 NOTE — Telephone Encounter (Signed)
Not Dx with DM got mother inlaws meter and checked it was over 500 advised to go straight to ER she said he would Decline he was not there.  ADvised he really need to go but I would send you a message. Please advise

## 2020-08-04 ENCOUNTER — Emergency Department (HOSPITAL_COMMUNITY)
Admission: EM | Admit: 2020-08-04 | Discharge: 2020-08-04 | Disposition: A | Discharge status: Home / Self Care | Payer: 59 | Attending: Emergency Medicine | Admitting: Emergency Medicine

## 2020-08-04 DIAGNOSIS — R739 Hyperglycemia, unspecified: Secondary | ICD-10-CM

## 2020-08-04 LAB — BASIC METABOLIC PANEL
Anion gap: 10 (ref 5–15)
BUN: 22 mg/dL — ABNORMAL HIGH (ref 6–20)
CO2: 24 mmol/L (ref 22–32)
Calcium: 9.3 mg/dL (ref 8.9–10.3)
Chloride: 101 mmol/L (ref 98–111)
Creatinine, Ser: 0.96 mg/dL (ref 0.61–1.24)
GFR, Estimated: >60 mL/min (ref >60)
Glucose, Bld: 281 mg/dL — ABNORMAL HIGH (ref 70–99)
Potassium: 3.6 mmol/L (ref 3.5–5.1)
Sodium: 135 mmol/L (ref 135–145)

## 2020-08-04 LAB — CBC
HCT: 46 % (ref 39.0–52.0)
Hemoglobin: 15 g/dL (ref 13.0–17.0)
MCH: 28.8 pg (ref 26.0–34.0)
MCHC: 32.6 g/dL (ref 30.0–36.0)
MCV: 88.5 fL (ref 80.0–100.0)
Platelets: 165 10*3/uL (ref 150–400)
RBC: 5.2 MIL/uL (ref 4.22–5.81)
RDW: 13.3 % (ref 11.5–15.5)
WBC: 9.9 10*3/uL (ref 4.0–10.5)
nRBC: 0 % (ref 0.0–0.2)

## 2020-08-04 MED ORDER — METFORMIN HCL 500 MG PO TABS: 500.0000 mg | ORAL_TABLET | Freq: Two times a day (BID) | ORAL | 0 refills | Status: DC

## 2020-08-04 NOTE — ED Provider Notes (Signed)
[facility] EMERGENCY DEPARTMENT Provider Note   CSN: [id number] Arrival date & time: 08/03/20  1925     History Chief Complaint  Patient presents with  . Hyperglycemia    Bruce Fisher is a 60 y.o. male.  The history is provided by the patient.  Hyperglycemia Severity:  Moderate Onset quality:  Gradual Timing:  Intermittent Progression:  Worsening Chronicity:  New Diabetes status:  Non-diabetic Relieved by:  None tried Associated symptoms: dehydration, fatigue, increased thirst and polyuria   Associated symptoms: no abdominal pain, no chest pain, no shortness of breath and no vomiting   Patient with history of hypertension hyperlipidemia presents with elevated glucose.  Patient is not currently diabetic.  He reports for over a week he has been checking his glucose at home with the family members glucose meter, has been elevated.  He reports increased thirst and polyuria No fevers or vomiting.  No chest pain or shortness of breath. He called his PCP who instructed him to go to ER    Past Medical History:  Diagnosis Date  . Hyperlipidemia   . Hypertension   . Kidney stones   . Rectal spasm   . Renal disorder    kidney stones    Patient Active Problem List   Diagnosis Date Noted  . PVC's (premature ventricular contractions) 05/28/2019  . Multiple kidney stones 02/21/2019  . Ventricular bigeminy 10/24/2018  . Grade I diastolic dysfunction 02/54/2706  . GERD (gastroesophageal reflux disease) 05/19/2015  . Erectile dysfunction 03/23/2015  . Mixed hyperlipidemia   . Hypertension     Past Surgical History:  Procedure Laterality Date  . SPINE SURGERY     lower back       Family History  Problem Relation Age of Onset  . Diabetes Mother   . Heart failure Mother   . Cancer Father        Asbestos  . Cancer Sister        breast  . Heart disease Sister        valve    Social History   Tobacco Use  . Smoking status: Former Smoker    Types: Cigarettes     Quit date: 08/19/1993    Years since quitting: 26.9  . Smokeless tobacco: Never Used  Vaping Use  . Vaping Use: Never used  Substance Use Topics  . Alcohol use: No    Alcohol/week: 0.0 standard drinks  . Drug use: No    Home Medications Prior to Admission medications   Medication Sig Start Date End Date Taking? Authorizing Provider  metFORMIN (GLUCOPHAGE) 500 MG tablet Take 1 tablet (500 mg total) by mouth 2 (two) times daily with a meal. 08/04/20  Yes Ripley Fraise, MD  amLODipine (NORVASC) 5 MG tablet Take 1 tablet (5 mg total) by mouth daily. 03/31/20 03/26/21  [name], MD  benazepril (LOTENSIN) 40 MG tablet Take 1 tablet (40 mg total) by mouth daily. 03/31/20   [name], MD  Cholecalciferol (VITAMIN D PO) Take 1 tablet by mouth daily.    [provider]  Multiple Vitamin (MULTIVITAMIN WITH MINERALS) TABS tablet Take 1 tablet by mouth daily.    [provider]  rosuvastatin (CRESTOR) 20 MG tablet Take 1 tablet (20 mg total) by mouth daily. For cholesterol 04/01/20   [name], MD  triamcinolone (NASACORT) 55 MCG/ACT AERO nasal inhaler Place 2 sprays into the nose daily. 03/23/20   [name], MD  triamterene-hydrochlorothiazide (MAXZIDE-25) 37.5-25 MG tablet Take 1 tablet by mouth  daily. 03/31/20   [name], MD    Allergies    Patient has no known allergies.  Review of Systems   Review of Systems  Constitutional: Positive for fatigue.  Respiratory: Negative for shortness of breath.   Cardiovascular: Negative for chest pain.  Gastrointestinal: Negative for abdominal pain and vomiting.  Endocrine: Positive for polydipsia and polyuria.  All other systems reviewed and are negative.   Physical Exam Updated Vital Signs BP (!) 135/110 (BP Location: Right Arm)   Pulse 81   Temp 99.1 F (37.3 C) (Oral)   Resp 18   Ht 1.88 m (6\' 2" )   Wt 120.2 kg   SpO2 96%   BMI 34.02 kg/m   Physical Exam CONSTITUTIONAL: Well developed/well  nourished HEAD: Normocephalic/atraumatic EYES: EOMI/PERRL NECK: supple no meningeal signs SPINE/BACK:entire spine nontender CV: S1/S2 noted, no murmurs/rubs/gallops noted LUNGS: Lungs are clear to auscultation bilaterally, no apparent distress ABDOMEN: soft, nontender NEURO: Pt is awake/alert/appropriate, moves all extremitiesx4.  No facial droop.   EXTREMITIES: pulses normal/equal, full ROM SKIN: warm, color normal PSYCH: no abnormalities of mood noted, alert and oriented to situation  ED Results / Procedures / Treatments   Labs (all labs ordered are listed, but only abnormal results are displayed) Labs Reviewed  BASIC METABOLIC PANEL - Abnormal; Notable for the following components:      Result Value   Glucose, Bld 281 (*)    BUN 22 (*)    All other components within normal limits  URINALYSIS, ROUTINE W REFLEX MICROSCOPIC - Abnormal; Notable for the following components:   Specific Gravity, Urine 1.032 (*)    Glucose, UA >=500 (*)    Ketones, ur 5 (*)    Protein, ur 100 (*)    All other components within normal limits  CBG MONITORING, ED - Abnormal; Notable for the following components:   Glucose-Capillary 313 (*)    All other components within normal limits  CBC  CBG MONITORING, ED    EKG None  Radiology No results found.  Procedures Procedures  Medications Ordered in ED Medications - No data to display  ED Course  I have reviewed the triage vital signs and the nursing notes.  Pertinent labs  results that were available during my care of the patient were reviewed by me and considered in my medical decision making (see chart for details).    MDM Rules/Calculators/A&P                          Patient stable, he is hyperglycemic.  No signs of DKA.  Suspect type 2 diabetes.  Will place on 10-day course of metformin, and he has PCP follow-up in 7 days.  Final Clinical Impression(s) / ED Diagnoses Final diagnoses:  Hyperglycemia    Rx / DC Orders ED  Discharge Orders         Ordered    metFORMIN (GLUCOPHAGE) 500 MG tablet  2 times daily with meals        08/04/20 0323           Ripley Fraise, MD 08/04/20 0401

## 2020-08-11 ENCOUNTER — Ambulatory Visit: Payer: 59 | Admitting: Family Medicine

## 2020-08-12 ENCOUNTER — Ambulatory Visit: Payer: 59 | Admitting: Family Medicine

## 2020-08-19 ENCOUNTER — Other Ambulatory Visit: Payer: Self-pay

## 2020-08-19 ENCOUNTER — Encounter: Payer: Self-pay | Admitting: Family Medicine

## 2020-08-19 ENCOUNTER — Ambulatory Visit (INDEPENDENT_AMBULATORY_CARE_PROVIDER_SITE_OTHER): Payer: 59 | Admitting: Family Medicine

## 2020-08-19 VITALS — BP 132/83 | HR 63 | Temp 97.5°F | Ht 74.0 in | Wt 261.0 lb

## 2020-08-19 DIAGNOSIS — E119 Type 2 diabetes mellitus without complications: Secondary | ICD-10-CM

## 2020-08-19 LAB — CMP14+EGFR
ALT: 29 IU/L (ref 0–44)
AST: 16 IU/L (ref 0–40)
Albumin/Globulin Ratio: 1.5 (ref 1.2–2.2)
Albumin: 4.7 g/dL (ref 3.8–4.9)
Alkaline Phosphatase: 79 IU/L (ref 44–121)
BUN/Creatinine Ratio: 14 (ref 9–20)
BUN: 15 mg/dL (ref 6–24)
Bilirubin Total: 0.5 mg/dL (ref 0.0–1.2)
CO2: 24 mmol/L (ref 20–29)
Calcium: 9.7 mg/dL (ref 8.7–10.2)
Chloride: 102 mmol/L (ref 96–106)
Creatinine, Ser: 1.05 mg/dL (ref 0.76–1.27)
GFR calc Af Amer: 91 mL/min/{1.73_m2} (ref >59)
GFR calc non Af Amer: 78 mL/min/{1.73_m2} (ref >59)
Globulin, Total: 3.2 g/dL (ref 1.5–4.5)
Glucose: 286 mg/dL — ABNORMAL HIGH (ref 65–99)
Potassium: 4.5 mmol/L (ref 3.5–5.2)
Sodium: 141 mmol/L (ref 134–144)
Total Protein: 7.9 g/dL (ref 6.0–8.5)

## 2020-08-19 LAB — CBC WITH DIFFERENTIAL/PLATELET
Basophils Absolute: 0 10*3/uL (ref 0.0–0.2)
Basos: 1 %
EOS (ABSOLUTE): 0.1 10*3/uL (ref 0.0–0.4)
Eos: 2 %
Hematocrit: 43.8 % (ref 37.5–51.0)
Hemoglobin: 14.7 g/dL (ref 13.0–17.7)
Immature Grans (Abs): 0 10*3/uL (ref 0.0–0.1)
Immature Granulocytes: 0 %
Lymphocytes Absolute: 2.7 10*3/uL (ref 0.7–3.1)
Lymphs: 40 %
MCH: 28.5 pg (ref 26.6–33.0)
MCHC: 33.6 g/dL (ref 31.5–35.7)
MCV: 85 fL (ref 79–97)
Monocytes Absolute: 0.6 10*3/uL (ref 0.1–0.9)
Monocytes: 9 %
Neutrophils Absolute: 3.2 10*3/uL (ref 1.4–7.0)
Neutrophils: 48 %
Platelets: 171 10*3/uL (ref 150–450)
RBC: 5.16 x10E6/uL (ref 4.14–5.80)
RDW: 12.7 % (ref 11.6–15.4)
WBC: 6.6 10*3/uL (ref 3.4–10.8)

## 2020-08-19 LAB — BAYER DCA HB A1C WAIVED: HB A1C (BAYER DCA - WAIVED): 11.1 % — ABNORMAL HIGH (ref <7.0)

## 2020-08-19 NOTE — Patient Instructions (Signed)
https://www.diabeteseducator.org/docs/default-source/living-with-diabetes/conquering-the-grocery-store-v1.pdf?sfvrsn=4">  Carbohydrate Counting for Diabetes Mellitus, Adult Carbohydrate counting is a method of keeping track of how many carbohydrates you eat. Eating carbohydrates naturally increases the amount of sugar (glucose) in the blood. Counting how many carbohydrates you eat improves your blood glucose control, which helps you manage your diabetes. It is important to know how many carbohydrates you can safely have in each meal. This is different for every person. A dietitian can help you make a meal plan and calculate how many carbohydrates you should have at each meal and snack. What foods contain carbohydrates? Carbohydrates are found in the following foods:  Grains, such as breads and cereals.  Dried beans and soy products.  Starchy vegetables, such as potatoes, peas, and corn.  Fruit and fruit juices.  Milk and yogurt.  Sweets and snack foods, such as cake, cookies, candy, chips, and soft drinks.   How do I count carbohydrates in foods? There are two ways to count carbohydrates in food. You can read food labels or learn standard serving sizes of foods. You can use either of the methods or a combination of both. Using the Nutrition Facts label The Nutrition Facts list is included on the labels of almost all packaged foods and beverages in the [place] It includes:  The serving size.  Information about nutrients in each serving, including the grams (g) of carbohydrate per serving. To use the Nutrition Facts:  Decide how many servings you will have.  Multiply the number of servings by the number of carbohydrates per serving.  The resulting number is the total amount of carbohydrates that you will be having. Learning the standard serving sizes of foods When you eat carbohydrate foods that are not packaged or do not include Nutrition Facts on the label, you need to measure the  servings in order to count the amount of carbohydrates.  Measure the foods that you will eat with a food scale or measuring cup, if needed.  Decide how many standard-size servings you will eat.  Multiply the number of servings by 15. For foods that contain carbohydrates, one serving equals 15 g of carbohydrates. ? For example, if you eat 2 cups or 10 oz (300 g) of strawberries, you will have eaten 2 servings and 30 g of carbohydrates (2 servings x 15 g = 30 g).  For foods that have more than one food mixed, such as soups and casseroles, you must count the carbohydrates in each food that is included. The following list contains standard serving sizes of common carbohydrate-rich foods. Each of these servings has about 15 g of carbohydrates:  1 slice of bread.  1 six-inch (15 cm) tortilla.  ? cup or 2 oz (53 g) cooked rice or pasta.   cup or 3 oz (85 g) cooked or canned, drained and rinsed beans or lentils.   cup or 3 oz (85 g) starchy vegetable, such as peas, corn, or squash.   cup or 4 oz (120 g) hot cereal.   cup or 3 oz (85 g) boiled or mashed potatoes, or  or 3 oz (85 g) of a large baked potato.   cup or 4 fl oz (118 mL) fruit juice.  1 cup or 8 fl oz (237 mL) milk.  1 small or 4 oz (106 g) apple.   or 2 oz (63 g) of a medium banana.  1 cup or 5 oz (150 g) strawberries.  3 cups or 1 oz (24 g) popped popcorn. What is an example of   carbohydrate counting? To calculate the number of carbohydrates in this sample meal, follow the steps shown below. Sample meal  3 oz (85 g) chicken breast.  ? cup or 4 oz (106 g) brown rice.   cup or 3 oz (85 g) corn.  1 cup or 8 fl oz (237 mL) milk.  1 cup or 5 oz (150 g) strawberries with sugar-free whipped topping. Carbohydrate calculation 1. Identify the foods that contain carbohydrates: ? Rice. ? Corn. ? Milk. ? Strawberries. 2. Calculate how many servings you have of each food: ? 2 servings rice. ? 1 serving  corn. ? 1 serving milk. ? 1 serving strawberries. 3. Multiply each number of servings by 15 g: ? 2 servings rice x 15 g = 30 g. ? 1 serving corn x 15 g = 15 g. ? 1 serving milk x 15 g = 15 g. ? 1 serving strawberries x 15 g = 15 g. 4. Add together all of the amounts to find the total grams of carbohydrates eaten: ? 30 g + 15 g + 15 g + 15 g = 75 g of carbohydrates total. What are tips for following this plan? Shopping  Develop a meal plan and then make a shopping list.  Buy fresh and frozen vegetables, fresh and frozen fruit, dairy, eggs, beans, lentils, and whole grains.  Look at food labels. Choose foods that have more fiber and less sugar.  Avoid processed foods and foods with added sugars. Meal planning  Aim to have the same amount of carbohydrates at each meal and for each snack time.  Plan to have regular, balanced meals and snacks. Where to find more information  American Diabetes Association: www.diabetes.org  Centers for Disease Control and Prevention: http://www.wolf.info/ Summary  Carbohydrate counting is a method of keeping track of how many carbohydrates you eat.  Eating carbohydrates naturally increases the amount of sugar (glucose) in the blood.  Counting how many carbohydrates you eat improves your blood glucose control, which helps you manage your diabetes.  A dietitian can help you make a meal plan and calculate how many carbohydrates you should have at each meal and snack. This information is not intended to replace advice given to you by your health care provider. Make sure you discuss any questions you have with your health care provider. Document Revised: 07/11/2019 Document Reviewed: 07/12/2019 Elsevier Patient Education  2021 [employer].  Diabetes Mellitus and [place] care is an important part of your health, especially when you have diabetes. Diabetes may cause you to have problems because of poor blood flow (circulation) to your feet and legs,  which can cause your skin to:  Become thinner and drier.  Break more easily.  Heal more slowly.  Peel and crack. You may also have nerve damage (neuropathy) in your legs and feet, causing decreased feeling in them. This means that you may not notice minor injuries to your feet that could lead to more serious problems. Noticing and addressing any potential problems early is the best way to prevent future foot problems. How to care for your feet Foot hygiene  Wash your feet daily with warm water and mild soap. Do not use hot water. Then, pat your feet and the areas between your toes until they are completely dry. Do not soak your feet as this can dry your skin.  Trim your toenails straight across. Do not dig under them or around the cuticle. File the edges of your nails with an Tax adviser  nail file.  Apply a moisturizing lotion or petroleum jelly to the skin on your feet and to dry, brittle toenails. Use lotion that does not contain alcohol and is unscented. Do not apply lotion between your toes.   Shoes and socks  Wear clean socks or stockings every day. Make sure they are not too tight. Do not wear knee-high stockings since they may decrease blood flow to your legs.  Wear shoes that fit properly and have enough cushioning. Always look in your shoes before you put them on to be sure there are no objects inside.  To break in new shoes, wear them for just a few hours a day. This prevents injuries on your feet. Wounds, scrapes, corns, and calluses  Check your feet daily for blisters, cuts, bruises, sores, and redness. If you cannot see the bottom of your feet, use a mirror or ask someone for help.  Do not cut corns or calluses or try to remove them with medicine.  If you find a minor scrape, cut, or break in the skin on your feet, keep it and the skin around it clean and dry. You may clean these areas with mild soap and water. Do not clean the area with peroxide, alcohol, or iodine.  If  you have a wound, scrape, corn, or callus on your foot, look at it several times a day to make sure it is healing and not infected. Check for: ? Redness, swelling, or pain. ? Fluid or blood. ? Warmth. ? Pus or a bad smell.   General tips  Do not cross your legs. This may decrease blood flow to your feet.  Do not use heating pads or hot water bottles on your feet. They may burn your skin. If you have lost feeling in your feet or legs, you may not know this is happening until it is too late.  Protect your feet from hot and cold by wearing shoes, such as at the beach or on hot pavement.  Schedule a complete foot exam at least once a year (annually) or more often if you have foot problems. Report any cuts, sores, or bruises to your health care provider immediately. Where to find more information  American Diabetes Association: www.diabetes.org  Association of Diabetes Care & Education Specialists: www.diabeteseducator.org Contact a health care provider if:  You have a medical condition that increases your risk of infection and you have any cuts, sores, or bruises on your feet.  You have an injury that is not healing.  You have redness on your legs or feet.  You feel burning or tingling in your legs or feet.  You have pain or cramps in your legs and feet.  Your legs or feet are numb.  Your feet always feel cold.  You have pain around any toenails. Get help right away if:  You have a wound, scrape, corn, or callus on your foot and: ? You have pain, swelling, or redness that gets worse. ? You have fluid or blood coming from the wound, scrape, corn, or callus. ? Your wound, scrape, corn, or callus feels warm to the touch. ? You have pus or a bad smell coming from the wound, scrape, corn, or callus. ? You have a fever. ? You have a red line going up your leg. Summary  Check your feet every day for blisters, cuts, bruises, sores, and redness.  Apply a moisturizing lotion or  petroleum jelly to the skin on your feet and to dry, brittle   toenails.  Wear shoes that fit properly and have enough cushioning.  If you have foot problems, report any cuts, sores, or bruises to your health care provider immediately.  Schedule a complete foot exam at least once a year (annually) or more often if you have foot problems. This information is not intended to replace advice given to you by your health care provider. Make sure you discuss any questions you have with your health care provider. Document Revised: 01/30/2020 Document Reviewed: 01/30/2020 Elsevier Patient Education  2021 Elsevier Inc.  

## 2020-08-19 NOTE — Progress Notes (Signed)
Subjective:  Patient ID: Bruce Fisher,  male    DOB: October 02, 1960  Age: 60 y.o.    CC: Hyperglycemia and Polydipsia   HPI [name] presents for  follow-up of hypertension. Patient has no history of headache chest pain or shortness of breath or recent cough. Patient also denies symptoms of TIA such as numbness weakness lateralizing. Patient denies side effects from medication. States taking it regularly.  Patient also  in for follow-up of elevated cholesterol. Doing well without complaints on current medication. Denies side effects  including myalgia and arthralgia and nausea. Also in today for liver function testing. Currently no chest pain, shortness of breath or other cardiovascular related symptoms noted.  New onset of diabetes.  He was recently seen in the emergency department for symptoms of fatigue dehydration polydipsia and polyuria.  He was found to have an elevated glucose at that time.  The physician in the emergency department placed him on Metformin twice a day.  He has a monitor that he borrowed from his mother in law and has been checking the sugar at home it has dropped into the 150 range and below.  He is checking before breakfast and before supper.  He says that he has diabetics in the family and he has a good concept of how to eat properly and he has assumed a low-carb diet.  His sugars were doing so well that he cut himself back to once a day on the Metformin.  Since that time his symptoms noted above have resolved.     History Bliss has a past medical history of Hyperlipidemia, Hypertension, Kidney stones, Rectal spasm, and Renal disorder.   He has a past surgical history that includes Spine surgery.   His family history includes Cancer in his father and sister; Diabetes in his mother; Heart disease in his sister; Heart failure in his mother.He reports that he quit smoking about 27 years ago. His smoking use included cigarettes. He has never used smokeless tobacco.  He reports that he does not drink alcohol and does not use drugs.  Current Outpatient Medications on File Prior to Visit  Medication Sig Dispense Refill  . amLODipine (NORVASC) 5 MG tablet Take 1 tablet (5 mg total) by mouth daily. 90 tablet 1  . benazepril (LOTENSIN) 40 MG tablet Take 1 tablet (40 mg total) by mouth daily. 90 tablet 1  . Cholecalciferol (VITAMIN D PO) Take 1 tablet by mouth daily.    . metFORMIN (GLUCOPHAGE) 500 MG tablet Take 1 tablet (500 mg total) by mouth 2 (two) times daily with a meal. 20 tablet 0  . Multiple Vitamin (MULTIVITAMIN WITH MINERALS) TABS tablet Take 1 tablet by mouth daily.    . rosuvastatin (CRESTOR) 20 MG tablet Take 1 tablet (20 mg total) by mouth daily. For cholesterol 90 tablet 1  . triamcinolone (NASACORT) 55 MCG/ACT AERO nasal inhaler Place 2 sprays into the nose daily. 6.8 mL 0  . triamterene-hydrochlorothiazide (MAXZIDE-25) 37.5-25 MG tablet Take 1 tablet by mouth daily. 90 tablet 1   No current facility-administered medications on file prior to visit.    ROS Review of Systems  Constitutional: Negative for fever.  Respiratory: Negative for shortness of breath.   Cardiovascular: Negative for chest pain.  Musculoskeletal: Negative for arthralgias.  Skin: Negative for rash.    Objective:  BP 132/83   Pulse 63   Temp (!) 97.5 F (36.4 C) (Temporal)   Ht '6\' 2"'  (1.88 m)   Wt 261 lb (118.4  kg)   BMI 33.51 kg/m   BP Readings from Last 3 Encounters:  08/19/20 132/83  08/04/20 (!) 135/110  03/31/20 (!) 160/82    Wt Readings from Last 3 Encounters:  08/19/20 261 lb (118.4 kg)  08/03/20 265 lb (120.2 kg)  03/31/20 273 lb (123.8 kg)     Physical Exam Vitals reviewed.  Constitutional:      Appearance: He is well-developed and well-nourished.  HENT:     Head: Normocephalic and atraumatic.     Right Ear: External ear normal.     Left Ear: External ear normal.     Mouth/Throat:     Pharynx: No oropharyngeal exudate or posterior  oropharyngeal erythema.  Eyes:     Pupils: Pupils are equal, round, and reactive to light.  Cardiovascular:     Rate and Rhythm: Normal rate and regular rhythm.     Heart sounds: No murmur heard.   Pulmonary:     Effort: No respiratory distress.     Breath sounds: Normal breath sounds.  Musculoskeletal:     Cervical back: Normal range of motion and neck supple.  Neurological:     Mental Status: He is alert and oriented to person, place, and time.       Assessment & Plan:   [name] was seen today for hyperglycemia and polydipsia.  Diagnoses and all orders for this visit:  New onset type 2 diabetes mellitus ([place]) -     CBC with Differential/Platelet -     CMP14+EGFR -     Bayer DCA Hb A1c Waived -     Microalbumin / creatinine urine ratio   I am having [name] maintain his multivitamin with minerals, Cholecalciferol (VITAMIN D PO), triamcinolone, amLODipine, benazepril, triamterene-hydrochlorothiazide, rosuvastatin, and metFORMIN.   Over 30 minutes was spent with the patient more than one half of this was spent in counseling regarding diabetic diet exercise glucose monitoring and pathophysiology of insulin resistance as well as the significance of the hemoglobin A1c reading.  He was given written information concerning carbohydrate counting for diabetes and foot care for diabetics.  Follow-up: Return in about 3 months (around 11/17/2020), or if symptoms worsen or fail to improve, for diabetes.  [name], M.D.

## 2020-08-20 ENCOUNTER — Other Ambulatory Visit: Payer: Self-pay | Admitting: *Deleted

## 2020-08-20 ENCOUNTER — Telehealth: Payer: Self-pay

## 2020-08-20 LAB — MICROALBUMIN / CREATININE URINE RATIO
Creatinine, Urine: 92.6 mg/dL
Microalb/Creat Ratio: 89 mg/g creat — ABNORMAL HIGH (ref 0–29)
Microalbumin, Urine: 82.1 ug/mL

## 2020-08-20 MED ORDER — METFORMIN HCL 500 MG PO TABS: 500.0000 mg | ORAL_TABLET | Freq: Two times a day (BID) | ORAL | 2 refills | Status: DC

## 2020-08-20 NOTE — Telephone Encounter (Signed)
REFILL SENT AS REQUESTED ?

## 2020-08-20 NOTE — Progress Notes (Signed)
Hello Luie,  Your lab result is normal and/or stable.Some minor variations that are not significant are commonly marked abnormal, but do not represent any medical problem for you.  Best regards, Jenelle Drennon, M.D.

## 2020-09-11 ENCOUNTER — Telehealth: Payer: Self-pay | Admitting: *Deleted

## 2020-09-11 MED ORDER — METFORMIN HCL 500 MG PO TABS: 500.0000 mg | ORAL_TABLET | Freq: Two times a day (BID) | ORAL | 2 refills | Status: DC

## 2020-09-11 NOTE — Telephone Encounter (Signed)
Fax from [place]: metformin 500 mg 1 BID qty

## 2020-09-28 ENCOUNTER — Ambulatory Visit: Payer: 59 | Admitting: Family Medicine

## 2020-10-07 ENCOUNTER — Other Ambulatory Visit: Payer: Self-pay | Admitting: Family Medicine

## 2020-11-09 ENCOUNTER — Other Ambulatory Visit: Payer: Self-pay | Admitting: Family Medicine

## 2020-11-09 DIAGNOSIS — E782 Mixed hyperlipidemia: Secondary | ICD-10-CM

## 2020-11-17 ENCOUNTER — Other Ambulatory Visit: Payer: Self-pay

## 2020-11-17 ENCOUNTER — Encounter: Payer: Self-pay | Admitting: Family Medicine

## 2020-11-17 ENCOUNTER — Ambulatory Visit (INDEPENDENT_AMBULATORY_CARE_PROVIDER_SITE_OTHER): Payer: 59 | Admitting: Family Medicine

## 2020-11-17 VITALS — BP 140/60 | HR 48 | Temp 97.8°F | Ht 74.0 in | Wt 248.8 lb

## 2020-11-17 DIAGNOSIS — Z114 Encounter for screening for human immunodeficiency virus [HIV]: Secondary | ICD-10-CM

## 2020-11-17 DIAGNOSIS — E782 Mixed hyperlipidemia: Secondary | ICD-10-CM

## 2020-11-17 DIAGNOSIS — I10 Essential (primary) hypertension: Secondary | ICD-10-CM

## 2020-11-17 DIAGNOSIS — Z1159 Encounter for screening for other viral diseases: Secondary | ICD-10-CM

## 2020-11-17 DIAGNOSIS — E119 Type 2 diabetes mellitus without complications: Secondary | ICD-10-CM

## 2020-11-17 LAB — BAYER DCA HB A1C WAIVED: HB A1C (BAYER DCA - WAIVED): 6.1 % (ref <7.0)

## 2020-11-17 MED ORDER — ROSUVASTATIN CALCIUM 20 MG PO TABS: 20.0000 mg | ORAL_TABLET | Freq: Every day | ORAL | 0 refills | Status: DC

## 2020-11-17 NOTE — Progress Notes (Signed)
Subjective:  Patient ID: Bruce Fisher,  male    DOB: October 01, 1960  Age: 60 y.o.    CC: Medical Management of Chronic Issues   HPI Bruce Fisher presents for  follow-up of hypertension. Patient has no history of headache chest pain or shortness of breath or recent cough. Patient also denies symptoms of TIA such as numbness weakness lateralizing. Patient denies side effects from medication. States taking it regularly.  Patient also  in for follow-up of elevated cholesterol. Doing well without complaints on current medication. Denies side effects  including myalgia and arthralgia and nausea. Also in today for liver function testing. Currently no chest pain, shortness of breath or other cardiovascular related symptoms noted. Follow-up of diabetes. Patient does check blood sugar at home.  Patient denies symptoms such as excessive hunger or urinary frequency, excessive hunger, nausea No significant hypoglycemic spells noted. Medications reviewed. Pt reports taking them regularly. Pt. denies complication/adverse reaction today.    History Bruce Fisher has a past medical history of Hyperlipidemia, Hypertension, Kidney stones, Rectal spasm, and Renal disorder.   Bruce Fisher has a past surgical history that includes Spine surgery.   Bruce Fisher family history includes Cancer in Bruce Fisher father and sister; Diabetes in Bruce Fisher mother; Heart disease in Bruce Fisher sister; Heart failure in Bruce Fisher mother.Bruce Fisher reports that Bruce Fisher quit smoking about 27 years ago. Bruce Fisher smoking use included cigarettes. Bruce Fisher has never used smokeless tobacco. Bruce Fisher reports that Bruce Fisher does not drink alcohol and does not use drugs.  Current Outpatient Medications on File Prior to Visit  Medication Sig Dispense Refill  . amLODipine (NORVASC) 5 MG tablet Take 1 tablet by mouth once daily 90 tablet 0  . benazepril (LOTENSIN) 40 MG tablet Take 1 tablet (40 mg total) by mouth daily. 90 tablet 1  . Cholecalciferol (VITAMIN D PO) Take 1 tablet by mouth daily.    . metFORMIN (GLUCOPHAGE)  500 MG tablet Take 1 tablet (500 mg total) by mouth 2 (two) times daily with a meal. 60 tablet 2  . Multiple Vitamin (MULTIVITAMIN WITH MINERALS) TABS tablet Take 1 tablet by mouth daily.    Marland Kitchen triamcinolone (NASACORT) 55 MCG/ACT AERO nasal inhaler Place 2 sprays into the nose daily. 6.8 mL 0  . triamterene-hydrochlorothiazide (MAXZIDE-25) 37.5-25 MG tablet Take 1 tablet by mouth daily. 90 tablet 1   No current facility-administered medications on file prior to visit.    ROS Review of Systems  Constitutional: Negative for fever.  Respiratory: Negative for shortness of breath.   Cardiovascular: Negative for chest pain.  Musculoskeletal: Negative for arthralgias.  Skin: Negative for rash.    Objective:  BP 140/60   Pulse (!) 48   Temp 97.8 F (36.6 C)   Ht '6\' 2"'  (1.88 m)   Wt 248 lb 12.8 oz (112.9 kg)   SpO2 99%   BMI 31.94 kg/m   BP Readings from Last 3 Encounters:  11/17/20 140/60  08/19/20 132/83  08/04/20 (!) 135/110    Wt Readings from Last 3 Encounters:  11/17/20 248 lb 12.8 oz (112.9 kg)  08/19/20 261 lb (118.4 kg)  08/03/20 265 lb (120.2 kg)     Physical Exam Vitals reviewed.  Constitutional:      Appearance: Bruce Fisher well-developed.  HENT:     Head: Normocephalic and atraumatic.     Right Ear: External ear normal.     Left Ear: External ear normal.     Mouth/Throat:     Pharynx: No oropharyngeal exudate or posterior oropharyngeal erythema.  Eyes:  Pupils: Pupils are equal, round, and reactive to light.  Cardiovascular:     Rate and Rhythm: Normal rate and regular rhythm.     Heart sounds: No murmur heard.   Pulmonary:     Effort: No respiratory distress.     Breath sounds: Normal breath sounds.  Musculoskeletal:     Cervical back: Normal range of motion and neck supple.  Neurological:     Mental Status: Bruce Fisher alert and oriented to person, place, and time.     Diabetic Foot Exam - Simple   No data filed       Assessment & Plan:   Bruce Fisher  was seen today for medical management of chronic issues.  Diagnoses and all orders for this visit:  New onset type 2 diabetes mellitus ([place]) -     Bayer DCA Hb A1c Waived -     CBC with Differential/Platelet -     CMP14+EGFR -     Microalbumin / creatinine urine ratio  Mixed hyperlipidemia -     Lipid panel -     rosuvastatin (CRESTOR) 20 MG tablet; Take 1 tablet (20 mg total) by mouth daily. for cholesterol.  Essential hypertension -     CMP14+EGFR  Encounter for screening for HIV -     HIV Antibody (routine testing w rflx)  Need for hepatitis C screening test -     Hepatitis C antibody  Primary hypertension   I have changed Bruce Fisher's rosuvastatin. I am also having Bruce Fisher maintain Bruce Fisher multivitamin with minerals, Cholecalciferol (VITAMIN D PO), triamcinolone, benazepril, triamterene-hydrochlorothiazide, metFORMIN, and amLODipine.  Meds ordered this encounter  Medications  . rosuvastatin (CRESTOR) 20 MG tablet    Sig: Take 1 tablet (20 mg total) by mouth daily. for cholesterol.    Dispense:  90 tablet    Refill:  0     Follow-up: No follow-ups on file.  [name], M.D.

## 2020-11-18 LAB — CBC WITH DIFFERENTIAL/PLATELET
Basophils Absolute: 0.1 10*3/uL (ref 0.0–0.2)
Basos: 1 %
EOS (ABSOLUTE): 0.3 10*3/uL (ref 0.0–0.4)
Eos: 4 %
Hematocrit: 46.1 % (ref 37.5–51.0)
Hemoglobin: 14.8 g/dL (ref 13.0–17.7)
Immature Grans (Abs): 0 10*3/uL (ref 0.0–0.1)
Immature Granulocytes: 0 %
Lymphocytes Absolute: 2.9 10*3/uL (ref 0.7–3.1)
Lymphs: 34 %
MCH: 27.9 pg (ref 26.6–33.0)
MCHC: 32.1 g/dL (ref 31.5–35.7)
MCV: 87 fL (ref 79–97)
Monocytes Absolute: 0.8 10*3/uL (ref 0.1–0.9)
Monocytes: 9 %
Neutrophils Absolute: 4.3 10*3/uL (ref 1.4–7.0)
Neutrophils: 52 %
Platelets: 185 10*3/uL (ref 150–450)
RBC: 5.3 x10E6/uL (ref 4.14–5.80)
RDW: 14.3 % (ref 11.6–15.4)
WBC: 8.3 10*3/uL (ref 3.4–10.8)

## 2020-11-18 LAB — CMP14+EGFR
ALT: 17 IU/L (ref 0–44)
AST: 16 IU/L (ref 0–40)
Albumin/Globulin Ratio: 1.3 (ref 1.2–2.2)
Albumin: 4.7 g/dL (ref 3.8–4.9)
Alkaline Phosphatase: 63 IU/L (ref 44–121)
BUN/Creatinine Ratio: 21 — ABNORMAL HIGH (ref 9–20)
BUN: 22 mg/dL (ref 6–24)
Bilirubin Total: 0.4 mg/dL (ref 0.0–1.2)
CO2: 24 mmol/L (ref 20–29)
Calcium: 9.7 mg/dL (ref 8.7–10.2)
Chloride: 102 mmol/L (ref 96–106)
Creatinine, Ser: 1.03 mg/dL (ref 0.76–1.27)
Globulin, Total: 3.7 g/dL (ref 1.5–4.5)
Glucose: 116 mg/dL — ABNORMAL HIGH (ref 65–99)
Potassium: 4.6 mmol/L (ref 3.5–5.2)
Sodium: 143 mmol/L (ref 134–144)
Total Protein: 8.4 g/dL (ref 6.0–8.5)
eGFR: 85 mL/min/{1.73_m2} (ref >59)

## 2020-11-18 LAB — LIPID PANEL
Chol/HDL Ratio: 3 ratio (ref 0.0–5.0)
Cholesterol, Total: 133 mg/dL (ref 100–199)
HDL: 44 mg/dL (ref >39)
LDL Chol Calc (NIH): 72 mg/dL (ref 0–99)
Triglycerides: 90 mg/dL (ref 0–149)
VLDL Cholesterol Cal: 17 mg/dL (ref 5–40)

## 2020-11-18 NOTE — Progress Notes (Signed)
Hello Bruce Fisher,  Your lab result is normal and/or stable.Some minor variations that are not significant are commonly marked abnormal, but do not represent any medical problem for you.  Best regards, Tyniah Kastens, M.D.

## 2020-11-20 ENCOUNTER — Other Ambulatory Visit: Payer: Self-pay | Admitting: Family Medicine

## 2020-11-20 ENCOUNTER — Telehealth: Payer: Self-pay

## 2020-11-20 DIAGNOSIS — I10 Essential (primary) hypertension: Secondary | ICD-10-CM

## 2020-11-20 DIAGNOSIS — E782 Mixed hyperlipidemia: Secondary | ICD-10-CM

## 2020-11-20 MED ORDER — BENAZEPRIL HCL 40 MG PO TABS: 40.0000 mg | ORAL_TABLET | Freq: Every day | ORAL | 1 refills | Status: DC

## 2020-11-20 NOTE — Telephone Encounter (Signed)
Called and discussed meds with pt.  He had been taking the pravastatin and the gen crestor.  He is aware to stop pravastatin and that he was supposed to spot that (read in result note) from 03/2020.  He has also ran out of benazepril and this was refilled today.

## 2020-12-02 ENCOUNTER — Other Ambulatory Visit: Payer: Self-pay | Admitting: *Deleted

## 2020-12-02 MED ORDER — TRIAMCINOLONE ACETONIDE 55 MCG/ACT NA AERO: 2.0000 | INHALATION_SPRAY | Freq: Every day | NASAL | 1 refills | Status: DC

## 2021-01-04 ENCOUNTER — Other Ambulatory Visit: Payer: Self-pay | Admitting: Family Medicine

## 2021-01-26 ENCOUNTER — Other Ambulatory Visit: Payer: Self-pay | Admitting: Family Medicine

## 2021-01-26 DIAGNOSIS — I10 Essential (primary) hypertension: Secondary | ICD-10-CM

## 2021-02-16 ENCOUNTER — Ambulatory Visit: Payer: 59 | Admitting: Family Medicine

## 2021-03-04 ENCOUNTER — Encounter: Payer: Self-pay | Admitting: Family Medicine

## 2021-03-04 ENCOUNTER — Other Ambulatory Visit: Payer: Self-pay

## 2021-03-04 ENCOUNTER — Ambulatory Visit (INDEPENDENT_AMBULATORY_CARE_PROVIDER_SITE_OTHER): Payer: 59 | Admitting: Family Medicine

## 2021-03-04 VITALS — BP 148/82 | HR 36 | Temp 97.7°F | Ht 74.0 in | Wt 245.6 lb

## 2021-03-04 DIAGNOSIS — E119 Type 2 diabetes mellitus without complications: Secondary | ICD-10-CM

## 2021-03-04 DIAGNOSIS — E782 Mixed hyperlipidemia: Secondary | ICD-10-CM | POA: Diagnosis not present

## 2021-03-04 DIAGNOSIS — I10 Essential (primary) hypertension: Secondary | ICD-10-CM | POA: Diagnosis not present

## 2021-03-04 LAB — BAYER DCA HB A1C WAIVED: HB A1C (BAYER DCA - WAIVED): 5.6 % (ref <7.0)

## 2021-03-04 MED ORDER — TRIAMTERENE-HCTZ 37.5-25 MG PO TABS: 1.0000 | ORAL_TABLET | Freq: Every day | ORAL | 3 refills | Status: DC

## 2021-03-04 MED ORDER — AMLODIPINE BESYLATE 10 MG PO TABS: 10.0000 mg | ORAL_TABLET | Freq: Every day | ORAL | 3 refills | Status: DC

## 2021-03-04 MED ORDER — METFORMIN HCL 500 MG PO TABS: 500.0000 mg | ORAL_TABLET | Freq: Two times a day (BID) | ORAL | 3 refills | Status: DC

## 2021-03-04 MED ORDER — BENAZEPRIL HCL 40 MG PO TABS: 40.0000 mg | ORAL_TABLET | Freq: Every day | ORAL | 3 refills | Status: DC

## 2021-03-04 MED ORDER — ROSUVASTATIN CALCIUM 20 MG PO TABS: 20.0000 mg | ORAL_TABLET | Freq: Every day | ORAL | 3 refills | Status: DC

## 2021-03-04 NOTE — Progress Notes (Signed)
Subjective:  Patient ID: Bruce Fisher, male    DOB: 02-17-1961  Age: 60 y.o. MRN: [id number]  CC: Medical Management of Chronic Issues   HPI Vertis Fisher presents for presents forFollow-up of diabetes. Patient checks blood sugar at home.   118 fasting and 130 postprandial Patient denies symptoms such as polyuria, polydipsia, excessive hunger, nausea One significant hypoglycemic spell noted. Was before last visit. A little dizzy unitl he ate something.  Medications reviewed. Pt reports taking them regularly without complication/adverse reaction being reported today.     presents for  follow-up of hypertension. Patient has no history of headache chest pain or shortness of breath or recent cough. Patient also denies symptoms of TIA such as focal numbness or weakness. Patient denies side effects from medication. States taking it regularly.   in for follow-up of elevated cholesterol. Doing well without complaints on current medication. Denies side effects of statin including myalgia and arthralgia and nausea. Currently no chest pain, shortness of breath or other cardiovascular related symptoms noted.   Depression screen [facility] 2/9 03/04/2021 11/17/2020 08/19/2020  Decreased Interest 0 0 0  Down, Depressed, Hopeless 0 0 0  PHQ - 2 Score 0 0 0    History Christie has a past medical history of Hyperlipidemia, Hypertension, Kidney stones, Rectal spasm, and Renal disorder.   He has a past surgical history that includes Spine surgery.   His family history includes Cancer in his father and sister; Diabetes in his mother; Heart disease in his sister; Heart failure in his mother.He reports that he quit smoking about 27 years ago. His smoking use included cigarettes. He has never used smokeless tobacco. He reports that he does not drink alcohol and does not use drugs.    ROS Review of Systems  Constitutional:  Negative for fever.  Respiratory:  Negative for shortness of breath.   Cardiovascular:   Negative for chest pain.  Musculoskeletal:  Negative for arthralgias.  Skin:  Negative for rash.   Objective:  BP (!) 148/82   Pulse (!) 36   Temp 97.7 F (36.5 C)   Ht '6\' 2"'  (1.88 m)   Wt 245 lb 9.6 oz (111.4 kg)   SpO2 96%   BMI 31.53 kg/m   BP Readings from Last 3 Encounters:  03/04/21 (!) 148/82  11/17/20 140/60  08/19/20 132/83    Wt Readings from Last 3 Encounters:  03/04/21 245 lb 9.6 oz (111.4 kg)  11/17/20 248 lb 12.8 oz (112.9 kg)  08/19/20 261 lb (118.4 kg)     Physical Exam Vitals reviewed.  Constitutional:      Appearance: He is well-developed.  HENT:     Head: Normocephalic and atraumatic.     Right Ear: External ear normal.     Left Ear: External ear normal.     Mouth/Throat:     Pharynx: No oropharyngeal exudate or posterior oropharyngeal erythema.  Eyes:     Pupils: Pupils are equal, round, and reactive to light.  Cardiovascular:     Rate and Rhythm: Normal rate and regular rhythm.     Heart sounds: No murmur heard. Pulmonary:     Effort: No respiratory distress.     Breath sounds: Normal breath sounds.  Musculoskeletal:     Cervical back: Normal range of motion and neck supple.  Neurological:     Mental Status: He is alert and oriented to person, place, and time.      Assessment & Plan:   [name] was seen today for  medical management of chronic issues.  Diagnoses and all orders for this visit:  Essential hypertension -     CMP14+EGFR -     benazepril (LOTENSIN) 40 MG tablet; Take 1 tablet (40 mg total) by mouth daily. -     triamterene-hydrochlorothiazide (MAXZIDE-25) 37.5-25 MG tablet; Take 1 tablet by mouth daily.  Type 2 diabetes mellitus without complication, without long-term current use of insulin (HCC) -     Bayer DCA Hb A1c Waived -     CBC with Differential/Platelet  Mixed hyperlipidemia -     Lipid panel -     rosuvastatin (CRESTOR) 20 MG tablet; Take 1 tablet (20 mg total) by mouth daily. for cholesterol.  Other  orders -     amLODipine (NORVASC) 10 MG tablet; Take 1 tablet (10 mg total) by mouth daily. For blood pressure -     metFORMIN (GLUCOPHAGE) 500 MG tablet; Take 1 tablet (500 mg total) by mouth 2 (two) times daily with a meal.      I have discontinued Alonzo Forster's amLODipine. I have also changed his triamterene-hydrochlorothiazide and metFORMIN. Additionally, I am having him start on amLODipine. Lastly, I am having him maintain his multivitamin with minerals, Cholecalciferol (VITAMIN D PO), triamcinolone, benazepril, and rosuvastatin.  Allergies as of 03/04/2021   No Known Allergies      Medication List        Accurate as of March 04, 2021 10:38 AM. If you have any questions, ask your nurse or doctor.          amLODipine 10 MG tablet Commonly known as: NORVASC Take 1 tablet (10 mg total) by mouth daily. For blood pressure What changed:  medication strength how much to take additional instructions Changed by: [name], MD   benazepril 40 MG tablet Commonly known as: LOTENSIN Take 1 tablet (40 mg total) by mouth daily.   metFORMIN 500 MG tablet Commonly known as: GLUCOPHAGE Take 1 tablet (500 mg total) by mouth 2 (two) times daily with a meal.   multivitamin with minerals Tabs tablet Take 1 tablet by mouth daily.   rosuvastatin 20 MG tablet Commonly known as: CRESTOR Take 1 tablet (20 mg total) by mouth daily. for cholesterol.   triamcinolone 55 MCG/ACT Aero nasal inhaler Commonly known as: NASACORT Place 2 sprays into the nose daily.   triamterene-hydrochlorothiazide 37.5-25 MG tablet Commonly known as: MAXZIDE-25 Take 1 tablet by mouth daily.   VITAMIN D PO Take 1 tablet by mouth daily.         Follow-up: Return in about 3 months (around 06/04/2021).  [name], M.D.

## 2021-03-05 LAB — CBC WITH DIFFERENTIAL/PLATELET
Basophils Absolute: 0 10*3/uL (ref 0.0–0.2)
Basos: 0 %
EOS (ABSOLUTE): 0.2 10*3/uL (ref 0.0–0.4)
Eos: 2 %
Hematocrit: 43.6 % (ref 37.5–51.0)
Hemoglobin: 14.1 g/dL (ref 13.0–17.7)
Immature Grans (Abs): 0 10*3/uL (ref 0.0–0.1)
Immature Granulocytes: 0 %
Lymphocytes Absolute: 2.4 10*3/uL (ref 0.7–3.1)
Lymphs: 32 %
MCH: 29 pg (ref 26.6–33.0)
MCHC: 32.3 g/dL (ref 31.5–35.7)
MCV: 90 fL (ref 79–97)
Monocytes Absolute: 0.7 10*3/uL (ref 0.1–0.9)
Monocytes: 9 %
Neutrophils Absolute: 4.2 10*3/uL (ref 1.4–7.0)
Neutrophils: 57 %
Platelets: 166 10*3/uL (ref 150–450)
RBC: 4.86 x10E6/uL (ref 4.14–5.80)
RDW: 14.4 % (ref 11.6–15.4)
WBC: 7.5 10*3/uL (ref 3.4–10.8)

## 2021-03-05 LAB — CMP14+EGFR
ALT: 13 IU/L (ref 0–44)
AST: 17 IU/L (ref 0–40)
Albumin/Globulin Ratio: 1.5 (ref 1.2–2.2)
Albumin: 4.7 g/dL (ref 3.8–4.9)
Alkaline Phosphatase: 53 IU/L (ref 44–121)
BUN/Creatinine Ratio: 18 (ref 9–20)
BUN: 18 mg/dL (ref 6–24)
Bilirubin Total: 0.3 mg/dL (ref 0.0–1.2)
CO2: 25 mmol/L (ref 20–29)
Calcium: 9.6 mg/dL (ref 8.7–10.2)
Chloride: 104 mmol/L (ref 96–106)
Creatinine, Ser: 1.01 mg/dL (ref 0.76–1.27)
Globulin, Total: 3.2 g/dL (ref 1.5–4.5)
Glucose: 105 mg/dL — ABNORMAL HIGH (ref 65–99)
Potassium: 4.6 mmol/L (ref 3.5–5.2)
Sodium: 142 mmol/L (ref 134–144)
Total Protein: 7.9 g/dL (ref 6.0–8.5)
eGFR: 87 mL/min/{1.73_m2} (ref >59)

## 2021-03-05 LAB — LIPID PANEL
Chol/HDL Ratio: 2.9 ratio (ref 0.0–5.0)
Cholesterol, Total: 115 mg/dL (ref 100–199)
HDL: 39 mg/dL — ABNORMAL LOW (ref >39)
LDL Chol Calc (NIH): 56 mg/dL (ref 0–99)
Triglycerides: 110 mg/dL (ref 0–149)
VLDL Cholesterol Cal: 20 mg/dL (ref 5–40)

## 2021-03-08 NOTE — Progress Notes (Signed)
Hello Kahil,  Your lab result is normal and/or stable.Some minor variations that are not significant are commonly marked abnormal, but do not represent any medical problem for you.  Best regards, Araminta Zorn, M.D.

## 2021-06-07 ENCOUNTER — Encounter: Payer: Self-pay | Admitting: Family Medicine

## 2021-06-07 ENCOUNTER — Ambulatory Visit (INDEPENDENT_AMBULATORY_CARE_PROVIDER_SITE_OTHER): Payer: 59 | Admitting: Family Medicine

## 2021-06-07 ENCOUNTER — Other Ambulatory Visit: Payer: Self-pay

## 2021-06-07 VITALS — BP 120/80 | Temp 97.4°F | Ht 74.0 in | Wt 243.8 lb

## 2021-06-07 DIAGNOSIS — I10 Essential (primary) hypertension: Secondary | ICD-10-CM

## 2021-06-07 DIAGNOSIS — I499 Cardiac arrhythmia, unspecified: Secondary | ICD-10-CM

## 2021-06-07 DIAGNOSIS — E119 Type 2 diabetes mellitus without complications: Secondary | ICD-10-CM

## 2021-06-07 DIAGNOSIS — E782 Mixed hyperlipidemia: Secondary | ICD-10-CM

## 2021-06-07 LAB — BAYER DCA HB A1C WAIVED: HB A1C (BAYER DCA - WAIVED): 5.2 % (ref 4.8–5.6)

## 2021-06-07 MED ORDER — TRIAMCINOLONE ACETONIDE 55 MCG/ACT NA AERO: 2.0000 | INHALATION_SPRAY | Freq: Every day | NASAL | 11 refills | Status: DC

## 2021-06-07 MED ORDER — METFORMIN HCL 500 MG PO TABS: 500.0000 mg | ORAL_TABLET | Freq: Every day | ORAL | 3 refills | Status: DC

## 2021-06-07 NOTE — Progress Notes (Signed)
Subjective:  Patient ID: Bruce Fisher,  male    DOB: Nov 16, 1960  Age: 60 y.o.    CC: Medical Management of Chronic Issues   HPI Bruce Fisher presents for  follow-up of hypertension. Patient has no history of headache chest pain or shortness of breath or recent cough. Patient also denies symptoms of TIA such as numbness weakness lateralizing. Patient denies side effects from medication. States taking it regularly.    Patient also  in for follow-up of elevated cholesterol. Doing well without complaints on current medication. Denies side effects  including myalgia and arthralgia and nausea. Also in today for liver function testing. Currently no chest pain, shortness of breath or other cardiovascular related symptoms noted.  Follow-up of diabetes. Patient does check blood sugar at home. Readings run between 100 and 125 Patient denies symptoms such as excessive hunger or urinary frequency, excessive hunger, nausea No significant hypoglycemic spells noted. Medications reviewed. Pt reports taking them regularly. Pt. denies complication/adverse reaction today.    History Bruce Fisher has a past medical history of Hyperlipidemia, Hypertension, Kidney stones, Rectal spasm, and Renal disorder.   He has a past surgical history that includes Spine surgery.   His family history includes Cancer in his father and sister; Diabetes in his mother; Heart disease in his sister; Heart failure in his mother.He reports that he quit smoking about 27 years ago. His smoking use included cigarettes. He has never used smokeless tobacco. He reports that he does not drink alcohol and does not use drugs.  Current Outpatient Medications on File Prior to Visit  Medication Sig Dispense Refill   amLODipine (NORVASC) 10 MG tablet Take 1 tablet (10 mg total) by mouth daily. For blood pressure 90 tablet 3   benazepril (LOTENSIN) 40 MG tablet Take 1 tablet (40 mg total) by mouth daily. 90 tablet 3   Cholecalciferol (VITAMIN  D PO) Take 1 tablet by mouth daily.     metFORMIN (GLUCOPHAGE) 500 MG tablet Take 1 tablet (500 mg total) by mouth 2 (two) times daily with a meal. 180 tablet 3   Multiple Vitamin (MULTIVITAMIN WITH MINERALS) TABS tablet Take 1 tablet by mouth daily.     rosuvastatin (CRESTOR) 20 MG tablet Take 1 tablet (20 mg total) by mouth daily. for cholesterol. 90 tablet 3   triamcinolone (NASACORT) 55 MCG/ACT AERO nasal inhaler Place 2 sprays into the nose daily. 16.5 g 1   triamterene-hydrochlorothiazide (MAXZIDE-25) 37.5-25 MG tablet Take 1 tablet by mouth daily. 90 tablet 3   No current facility-administered medications on file prior to visit.    ROS Review of Systems  Constitutional:  Negative for fever.  Respiratory:  Negative for shortness of breath.   Cardiovascular:  Negative for chest pain.  Musculoskeletal:  Negative for arthralgias.  Skin:  Negative for rash.   Objective:  BP 120/80   Temp (!) 97.4 F (36.3 C)   Ht '6\' 2"'  (1.88 m)   Wt 243 lb 12.8 oz (110.6 kg)   SpO2 98%   BMI 31.30 kg/m   BP Readings from Last 3 Encounters:  06/07/21 120/80  03/04/21 (!) 148/82  11/17/20 140/60    Wt Readings from Last 3 Encounters:  06/07/21 243 lb 12.8 oz (110.6 kg)  03/04/21 245 lb 9.6 oz (111.4 kg)  11/17/20 248 lb 12.8 oz (112.9 kg)     Physical Exam Vitals reviewed.  Constitutional:      Appearance: He is well-developed.  HENT:     Head: Normocephalic and atraumatic.  Right Ear: External ear normal.     Left Ear: External ear normal.     Mouth/Throat:     Pharynx: No oropharyngeal exudate or posterior oropharyngeal erythema.  Eyes:     Pupils: Pupils are equal, round, and reactive to light.  Cardiovascular:     Rate and Rhythm: Normal rate. Rhythm irregular.     Heart sounds: No murmur heard. Pulmonary:     Effort: No respiratory distress.     Breath sounds: Normal breath sounds.  Musculoskeletal:     Cervical back: Normal range of motion and neck supple.   Neurological:     Mental Status: He is alert and oriented to person, place, and time.    Diabetic Foot Exam - Simple   No data filed     EKG - Ventricular trigeminy, slow rate 55  Assessment & Plan:   Bruce Fisher was seen today for medical management of chronic issues.  Diagnoses and all orders for this visit:  Essential hypertension -     CMP14+EGFR  Type 2 diabetes mellitus without complication, without long-term current use of insulin (HCC) -     Bayer DCA Hb A1c Waived -     CBC with Differential/Platelet  Mixed hyperlipidemia -     Lipid panel  I am having Bruce Fisher maintain his multivitamin with minerals, Cholecalciferol (VITAMIN D PO), triamcinolone, benazepril, rosuvastatin, triamterene-hydrochlorothiazide, amLODipine, and metFORMIN.  No orders of the defined types were placed in this encounter.    Follow-up: No follow-ups on file.  [name], M.D.

## 2021-06-08 LAB — CMP14+EGFR
ALT: 14 IU/L (ref 0–44)
AST: 14 IU/L (ref 0–40)
Albumin/Globulin Ratio: 1.3 (ref 1.2–2.2)
Albumin: 4.5 g/dL (ref 3.8–4.9)
Alkaline Phosphatase: 56 IU/L (ref 44–121)
BUN/Creatinine Ratio: 19 (ref 9–20)
BUN: 19 mg/dL (ref 6–24)
Bilirubin Total: 0.3 mg/dL (ref 0.0–1.2)
CO2: 23 mmol/L (ref 20–29)
Calcium: 9.6 mg/dL (ref 8.7–10.2)
Chloride: 105 mmol/L (ref 96–106)
Creatinine, Ser: 1 mg/dL (ref 0.76–1.27)
Globulin, Total: 3.4 g/dL (ref 1.5–4.5)
Glucose: 92 mg/dL (ref 70–99)
Potassium: 4.3 mmol/L (ref 3.5–5.2)
Sodium: 141 mmol/L (ref 134–144)
Total Protein: 7.9 g/dL (ref 6.0–8.5)
eGFR: 87 mL/min/{1.73_m2} (ref >59)

## 2021-06-08 LAB — CBC WITH DIFFERENTIAL/PLATELET
Basophils Absolute: 0 10*3/uL (ref 0.0–0.2)
Basos: 1 %
EOS (ABSOLUTE): 0.2 10*3/uL (ref 0.0–0.4)
Eos: 2 %
Hematocrit: 46.5 % (ref 37.5–51.0)
Hemoglobin: 14.9 g/dL (ref 13.0–17.7)
Immature Grans (Abs): 0 10*3/uL (ref 0.0–0.1)
Immature Granulocytes: 0 %
Lymphocytes Absolute: 2.5 10*3/uL (ref 0.7–3.1)
Lymphs: 33 %
MCH: 28.4 pg (ref 26.6–33.0)
MCHC: 32 g/dL (ref 31.5–35.7)
MCV: 89 fL (ref 79–97)
Monocytes Absolute: 0.7 10*3/uL (ref 0.1–0.9)
Monocytes: 9 %
Neutrophils Absolute: 4.3 10*3/uL (ref 1.4–7.0)
Neutrophils: 55 %
Platelets: 182 10*3/uL (ref 150–450)
RBC: 5.24 x10E6/uL (ref 4.14–5.80)
RDW: 14.4 % (ref 11.6–15.4)
WBC: 7.8 10*3/uL (ref 3.4–10.8)

## 2021-06-08 LAB — LIPID PANEL
Chol/HDL Ratio: 3.1 ratio (ref 0.0–5.0)
Cholesterol, Total: 120 mg/dL (ref 100–199)
HDL: 39 mg/dL — ABNORMAL LOW (ref >39)
LDL Chol Calc (NIH): 62 mg/dL (ref 0–99)
Triglycerides: 104 mg/dL (ref 0–149)
VLDL Cholesterol Cal: 19 mg/dL (ref 5–40)

## 2021-06-08 NOTE — Progress Notes (Signed)
Hello Earnie,  Your lab result is normal and/or stable.Some minor variations that are not significant are commonly marked abnormal, but do not represent any medical problem for you.  Best regards, Lubna Stegeman, M.D.

## 2021-08-31 ENCOUNTER — Other Ambulatory Visit: Payer: Self-pay | Admitting: Family Medicine

## 2021-08-31 DIAGNOSIS — I10 Essential (primary) hypertension: Secondary | ICD-10-CM

## 2021-09-07 ENCOUNTER — Ambulatory Visit: Payer: 59 | Admitting: Family Medicine

## 2021-11-29 ENCOUNTER — Encounter: Payer: Self-pay | Admitting: Family Medicine

## 2021-11-29 ENCOUNTER — Ambulatory Visit (INDEPENDENT_AMBULATORY_CARE_PROVIDER_SITE_OTHER): Payer: 59 | Admitting: Family Medicine

## 2021-11-29 VITALS — BP 121/66 | HR 66 | Temp 98.3°F | Ht 74.0 in | Wt 248.6 lb

## 2021-11-29 DIAGNOSIS — E119 Type 2 diabetes mellitus without complications: Secondary | ICD-10-CM | POA: Diagnosis not present

## 2021-11-29 DIAGNOSIS — E782 Mixed hyperlipidemia: Secondary | ICD-10-CM

## 2021-11-29 DIAGNOSIS — I10 Essential (primary) hypertension: Secondary | ICD-10-CM | POA: Diagnosis not present

## 2021-11-29 LAB — BAYER DCA HB A1C WAIVED: HB A1C (BAYER DCA - WAIVED): 5.8 % — ABNORMAL HIGH (ref 4.8–5.6)

## 2021-11-29 MED ORDER — BENAZEPRIL HCL 40 MG PO TABS: 40.0000 mg | ORAL_TABLET | Freq: Every day | ORAL | 3 refills | Status: DC

## 2021-11-29 NOTE — Progress Notes (Signed)
? ?Subjective:  ?Patient ID: Bruce Fisher,  ?male    DOB: [birth year]  Age: 61 y.o.  ? ? ?CC: Medical Management of Chronic Issues ? ? ?HPI ?Bruce Fisher presents for  follow-up of hypertension. Patient has no history of headache chest pain or shortness of breath or recent cough. Patient also denies symptoms of TIA such as numbness weakness lateralizing. Patient denies side effects from medication. States taking it regularly. ? ?Patient also  in for follow-up of elevated cholesterol. Doing well without complaints on current medication. Denies side effects  including myalgia and arthralgia and nausea. Also in today for liver function testing. Currently no chest pain, shortness of breath or other cardiovascular related symptoms noted. ? ?Follow-up of diabetes. Patient does check blood sugar at home. Readings run a little high. [place] 140-150 fasting and prandial.  ?Patient denies symptoms such as excessive hunger or urinary frequency, excessive hunger, nausea ?No significant hypoglycemic spells noted. ?Medications reviewed. Pt reports taking them regularly. Pt. denies complication/adverse reaction today.  ? ? ?History ?Zarion has a past medical history of Hyperlipidemia, Hypertension, Kidney stones, Rectal spasm, and Renal disorder.  ? ?He has a past surgical history that includes Spine surgery.  ? ?His family history includes Cancer in his father and sister; Diabetes in his mother; Heart disease in his sister; Heart failure in his mother.He reports that he quit smoking about 28 years ago. His smoking use included cigarettes. He has never used smokeless tobacco. He reports that he does not drink alcohol and does not use drugs. ? ?Current Outpatient Medications on File Prior to Visit  ?Medication Sig Dispense Refill  ? amLODipine (NORVASC) 10 MG tablet Take 1 tablet (10 mg total) by mouth daily. For blood pressure 90 tablet 3  ? Cholecalciferol (VITAMIN D PO) Take 1 tablet by mouth daily.    ? metFORMIN (GLUCOPHAGE)  500 MG tablet Take 1 tablet (500 mg total) by mouth daily with breakfast. 90 tablet 3  ? Multiple Vitamin (MULTIVITAMIN WITH MINERALS) TABS tablet Take 1 tablet by mouth daily.    ? rosuvastatin (CRESTOR) 20 MG tablet Take 1 tablet (20 mg total) by mouth daily. for cholesterol. 90 tablet 3  ? triamcinolone (NASACORT) 55 MCG/ACT AERO nasal inhaler Place 2 sprays into the nose daily. 16.5 g 11  ? triamterene-hydrochlorothiazide (MAXZIDE-25) 37.5-25 MG tablet Take 1 tablet by mouth daily. 90 tablet 3  ? ?No current facility-administered medications on file prior to visit.  ? ? ?ROS ?Review of Systems  ?Constitutional:  Negative for fever.  ?Respiratory:  Negative for shortness of breath.   ?Cardiovascular:  Negative for chest pain.  ?Musculoskeletal:  Negative for arthralgias.  ?Skin:  Negative for rash.  ? ?Objective:  ?BP 121/66   Pulse 66   Temp 98.3 ?F (36.8 ?C)   Ht '6\' 2"'  (1.88 m)   Wt 248 lb 9.6 oz (112.8 kg)   SpO2 95%   BMI 31.92 kg/m?  ? ?BP Readings from Last 3 Encounters:  ?11/29/21 121/66  ?06/07/21 120/80  ?03/04/21 (!) 148/82  ? ? ?Wt Readings from Last 3 Encounters:  ?11/29/21 248 lb 9.6 oz (112.8 kg)  ?06/07/21 243 lb 12.8 oz (110.6 kg)  ?03/04/21 245 lb 9.6 oz (111.4 kg)  ? ? ? ?Physical Exam ?Vitals reviewed.  ?Constitutional:   ?   Appearance: He is well-developed.  ?HENT:  ?   Head: Normocephalic and atraumatic.  ?   Right Ear: External ear normal.  ?   Left Ear: External ear  normal.  ?   Mouth/Throat:  ?   Pharynx: No oropharyngeal exudate or posterior oropharyngeal erythema.  ?Eyes:  ?   Pupils: Pupils are equal, round, and reactive to light.  ?Cardiovascular:  ?   Rate and Rhythm: Normal rate and regular rhythm.  ?   Heart sounds: No murmur heard. ?Pulmonary:  ?   Effort: No respiratory distress.  ?   Breath sounds: Normal breath sounds.  ?Musculoskeletal:  ?   Cervical back: Normal range of motion and neck supple.  ?Neurological:  ?   Mental Status: He is alert and oriented to person,  place, and time.  ? ? ?Diabetic Foot Exam - Simple   ?Simple Foot Form ?Diabetic Foot exam was performed with the following findings: Yes 11/29/2021  2:27 PM  ?Visual Inspection ?No deformities, no ulcerations, no other skin breakdown bilaterally: Yes ?Sensation Testing ?Intact to touch and monofilament testing bilaterally: Yes ?Pulse Check ?Posterior Tibialis and Dorsalis pulse intact bilaterally: Yes ?Comments ?  ? ? ?Lab Results  ?Component Value Date  ? HGBA1C 5.2 06/07/2021  ? HGBA1C 5.6 03/04/2021  ? HGBA1C 6.1 11/17/2020  ? ? ?Assessment & Plan:  ? ?[name] was seen today for medical management of chronic issues. ? ?Diagnoses and all orders for this visit: ? ?Essential hypertension ?-     CBC with Differential/Platelet ?-     CMP14+EGFR ?-     benazepril (LOTENSIN) 40 MG tablet; Take 1 tablet (40 mg total) by mouth daily. ? ?Type 2 diabetes mellitus without complication, without long-term current use of insulin ([place]) ?-     Bayer DCA Hb A1c Waived ? ?Mixed hyperlipidemia ?-     Lipid panel ? ? ?I have changed Bruce Fisher's benazepril. I am also having him maintain his multivitamin with minerals, Cholecalciferol (VITAMIN D PO), rosuvastatin, triamterene-hydrochlorothiazide, amLODipine, metFORMIN, and triamcinolone. ? ?Meds ordered this encounter  ?Medications  ? benazepril (LOTENSIN) 40 MG tablet  ?  Sig: Take 1 tablet (40 mg total) by mouth daily.  ?  Dispense:  90 tablet  ?  Refill:  3  ? ? ? ?Follow-up: Return in about 6 months (around 06/01/2022). ? ?Bruce Fisher, M.D. ?

## 2021-11-30 LAB — CMP14+EGFR
ALT: 20 IU/L (ref 0–44)
AST: 21 IU/L (ref 0–40)
Albumin/Globulin Ratio: 1.3 (ref 1.2–2.2)
Albumin: 4.5 g/dL (ref 3.8–4.9)
Alkaline Phosphatase: 54 IU/L (ref 44–121)
BUN/Creatinine Ratio: 14 (ref 9–20)
BUN: 15 mg/dL (ref 6–24)
Bilirubin Total: 0.3 mg/dL (ref 0.0–1.2)
CO2: 23 mmol/L (ref 20–29)
Calcium: 9.7 mg/dL (ref 8.7–10.2)
Chloride: 105 mmol/L (ref 96–106)
Creatinine, Ser: 1.05 mg/dL (ref 0.76–1.27)
Globulin, Total: 3.6 g/dL (ref 1.5–4.5)
Glucose: 159 mg/dL — ABNORMAL HIGH (ref 70–99)
Potassium: 4.3 mmol/L (ref 3.5–5.2)
Sodium: 144 mmol/L (ref 134–144)
Total Protein: 8.1 g/dL (ref 6.0–8.5)
eGFR: 82 mL/min/{1.73_m2} (ref >59)

## 2021-11-30 LAB — CBC WITH DIFFERENTIAL/PLATELET
Basophils Absolute: 0 10*3/uL (ref 0.0–0.2)
Basos: 1 %
EOS (ABSOLUTE): 0.1 10*3/uL (ref 0.0–0.4)
Eos: 2 %
Hematocrit: 43.3 % (ref 37.5–51.0)
Hemoglobin: 14.3 g/dL (ref 13.0–17.7)
Immature Grans (Abs): 0 10*3/uL (ref 0.0–0.1)
Immature Granulocytes: 0 %
Lymphocytes Absolute: 2.4 10*3/uL (ref 0.7–3.1)
Lymphs: 31 %
MCH: 28.8 pg (ref 26.6–33.0)
MCHC: 33 g/dL (ref 31.5–35.7)
MCV: 87 fL (ref 79–97)
Monocytes Absolute: 0.7 10*3/uL (ref 0.1–0.9)
Monocytes: 8 %
Neutrophils Absolute: 4.7 10*3/uL (ref 1.4–7.0)
Neutrophils: 58 %
Platelets: 167 10*3/uL (ref 150–450)
RBC: 4.97 x10E6/uL (ref 4.14–5.80)
RDW: 14.4 % (ref 11.6–15.4)
WBC: 8 10*3/uL (ref 3.4–10.8)

## 2021-11-30 LAB — LIPID PANEL
Chol/HDL Ratio: 3 ratio (ref 0.0–5.0)
Cholesterol, Total: 114 mg/dL (ref 100–199)
HDL: 38 mg/dL — ABNORMAL LOW (ref >39)
LDL Chol Calc (NIH): 45 mg/dL (ref 0–99)
Triglycerides: 188 mg/dL — ABNORMAL HIGH (ref 0–149)
VLDL Cholesterol Cal: 31 mg/dL (ref 5–40)

## 2021-11-30 NOTE — Progress Notes (Signed)
Hello Malakai,  Your lab result is normal and/or stable.Some minor variations that are not significant are commonly marked abnormal, but do not represent any medical problem for you.  Best regards, Saraiyah Hemminger, M.D.

## 2022-03-23 ENCOUNTER — Other Ambulatory Visit: Payer: Self-pay | Admitting: Family Medicine

## 2022-04-07 ENCOUNTER — Other Ambulatory Visit: Payer: Self-pay | Admitting: Family Medicine

## 2022-04-07 DIAGNOSIS — E782 Mixed hyperlipidemia: Secondary | ICD-10-CM

## 2022-04-28 ENCOUNTER — Other Ambulatory Visit: Payer: Self-pay | Admitting: Family Medicine

## 2022-04-28 DIAGNOSIS — I10 Essential (primary) hypertension: Secondary | ICD-10-CM

## 2022-05-02 ENCOUNTER — Other Ambulatory Visit: Payer: Self-pay | Admitting: Family Medicine

## 2022-05-03 ENCOUNTER — Other Ambulatory Visit: Payer: Self-pay | Admitting: Family Medicine

## 2022-05-04 ENCOUNTER — Other Ambulatory Visit: Payer: Self-pay | Admitting: Family Medicine

## 2022-06-06 ENCOUNTER — Ambulatory Visit: Payer: 59 | Admitting: Family Medicine

## 2022-06-22 ENCOUNTER — Other Ambulatory Visit (HOSPITAL_COMMUNITY): Payer: Self-pay

## 2022-06-22 ENCOUNTER — Ambulatory Visit (INDEPENDENT_AMBULATORY_CARE_PROVIDER_SITE_OTHER): Payer: 59 | Admitting: Family Medicine

## 2022-06-22 ENCOUNTER — Telehealth: Payer: Self-pay

## 2022-06-22 ENCOUNTER — Encounter: Payer: Self-pay | Admitting: Family Medicine

## 2022-06-22 VITALS — BP 137/71 | HR 52 | Temp 97.7°F | Ht 74.0 in | Wt 253.6 lb

## 2022-06-22 DIAGNOSIS — I10 Essential (primary) hypertension: Secondary | ICD-10-CM

## 2022-06-22 DIAGNOSIS — J3089 Other allergic rhinitis: Secondary | ICD-10-CM | POA: Diagnosis not present

## 2022-06-22 DIAGNOSIS — Z1321 Encounter for screening for nutritional disorder: Secondary | ICD-10-CM

## 2022-06-22 DIAGNOSIS — Z125 Encounter for screening for malignant neoplasm of prostate: Secondary | ICD-10-CM | POA: Diagnosis not present

## 2022-06-22 DIAGNOSIS — E782 Mixed hyperlipidemia: Secondary | ICD-10-CM

## 2022-06-22 DIAGNOSIS — E119 Type 2 diabetes mellitus without complications: Secondary | ICD-10-CM | POA: Diagnosis not present

## 2022-06-22 LAB — BAYER DCA HB A1C WAIVED: HB A1C (BAYER DCA - WAIVED): 5.7 % — ABNORMAL HIGH (ref 4.8–5.6)

## 2022-06-22 MED ORDER — FEXOFENADINE-PSEUDOEPHED ER 180-240 MG PO TB24: 1.0000 | ORAL_TABLET | Freq: Every evening | ORAL | 3 refills | Status: DC

## 2022-06-22 MED ORDER — METFORMIN HCL 500 MG PO TABS: 500.0000 mg | ORAL_TABLET | Freq: Every day | ORAL | 3 refills | Status: DC

## 2022-06-22 MED ORDER — SILDENAFIL CITRATE 20 MG PO TABS: ORAL_TABLET | ORAL | 3 refills | Status: DC

## 2022-06-22 MED ORDER — AMLODIPINE BESYLATE 10 MG PO TABS: 10.0000 mg | ORAL_TABLET | Freq: Every day | ORAL | 3 refills | Status: DC

## 2022-06-22 MED ORDER — TRIAMTERENE-HCTZ 37.5-25 MG PO TABS: 1.0000 | ORAL_TABLET | Freq: Every day | ORAL | 3 refills | Status: DC

## 2022-06-22 MED ORDER — FEXOFENADINE-PSEUDOEPHED ER 180-240 MG PO TB24: 1.0000 | ORAL_TABLET | Freq: Every evening | ORAL | 11 refills | Status: DC

## 2022-06-22 MED ORDER — CO Q-10 100 MG PO CAPS: 100.0000 mg | ORAL_CAPSULE | Freq: Every day | ORAL | Status: AC

## 2022-06-22 MED ORDER — ROSUVASTATIN CALCIUM 20 MG PO TABS: 20.0000 mg | ORAL_TABLET | Freq: Every day | ORAL | 3 refills | Status: DC

## 2022-06-22 NOTE — Progress Notes (Signed)
Subjective:  Patient ID: Bruce Fisher,  male    DOB: 24-Feb-1961  Age: 61 y.o.    CC: Medical Management of Chronic Issues   HPI Boris Craun presents for  follow-up of hypertension. Patient has no history of headache chest pain or shortness of breath or recent cough. Patient also denies symptoms of TIA such as numbness weakness lateralizing. Patient denies side effects from medication. States taking it regularly.  Patient also  in for follow-up of elevated cholesterol. Doing well without complaints on current medication. Denies side effects  including myalgia and arthralgia and nausea. Also in today for liver function testing. Currently no chest pain, shortness of breath or other cardiovascular related symptoms noted.He did have some myalgia until he added CoQ10.   Follow-up of diabetes. Patient denies symptoms such as excessive hunger or urinary frequency, excessive hunger, nausea No significant hypoglycemic spells noted. Medications reviewed. Pt reports taking them regularly. Pt. denies complication/adverse reaction today.    History Adonay has a past medical history of Hyperlipidemia, Hypertension, Kidney stones, Rectal spasm, and Renal disorder.   He has a past surgical history that includes Spine surgery.   His family history includes Cancer in his father and sister; Diabetes in his mother; Heart disease in his sister; Heart failure in his mother.He reports that he quit smoking about 28 years ago. His smoking use included cigarettes. He has never used smokeless tobacco. He reports that he does not drink alcohol and does not use drugs.  Current Outpatient Medications on File Prior to Visit  Medication Sig Dispense Refill   benazepril (LOTENSIN) 40 MG tablet Take 1 tablet (40 mg total) by mouth daily. 90 tablet 3   Cholecalciferol (VITAMIN D PO) Take 1 tablet by mouth daily.     Multiple Vitamin (MULTIVITAMIN WITH MINERALS) TABS tablet Take 1 tablet by mouth daily.      triamcinolone (NASACORT) 55 MCG/ACT AERO nasal inhaler Place 2 sprays into the nose daily. 16.5 g 11   No current facility-administered medications on file prior to visit.    ROS Review of Systems  Constitutional:  Negative for fever.  HENT:  Positive for congestion, rhinorrhea and sneezing (perenial).   Respiratory:  Negative for shortness of breath.   Cardiovascular:  Negative for chest pain.  Musculoskeletal:  Negative for arthralgias.  Skin:  Negative for rash.    Objective:  BP 137/71   Pulse (!) 52   Temp 97.7 F (36.5 C)   Ht _0  (1.88 m)   Wt 253 lb 9.6 oz (115 kg)   SpO2 95%   BMI 32.56 kg/m   BP Readings from Last 3 Encounters:  06/22/22 137/71  11/29/21 121/66  06/07/21 120/80    Wt Readings from Last 3 Encounters:  06/22/22 253 lb 9.6 oz (115 kg)  11/29/21 248 lb 9.6 oz (112.8 kg)  06/07/21 243 lb 12.8 oz (110.6 kg)     Physical Exam Vitals reviewed.  Constitutional:      Appearance: He is well-developed.  HENT:     Head: Normocephalic and atraumatic.     Right Ear: External ear normal.     Left Ear: External ear normal.     Nose: Congestion present.     Mouth/Throat:     Pharynx: No oropharyngeal exudate or posterior oropharyngeal erythema.  Eyes:     Pupils: Pupils are equal, round, and reactive to light.  Cardiovascular:     Rate and Rhythm: Normal rate and regular rhythm.  Heart sounds: No murmur heard. Pulmonary:     Effort: No respiratory distress.     Breath sounds: Normal breath sounds.  Musculoskeletal:     Cervical back: Normal range of motion and neck supple.  Neurological:     Mental Status: He is alert and oriented to person, place, and time.     Diabetic Foot Exam - Simple   No data filed     Lab Results  Component Value Date   HGBA1C 5.7 (H) 06/22/2022   HGBA1C 5.8 (H) 11/29/2021   HGBA1C 5.2 06/07/2021    Assessment & Plan:   Holdyn was seen today for medical management of chronic issues.  Diagnoses and  all orders for this visit:  Essential hypertension -     CBC with Differential/Platelet -     CMP14+EGFR -     triamterene-hydrochlorothiazide (MAXZIDE-25) 37.5-25 MG tablet; Take 1 tablet by mouth daily.  Type 2 diabetes mellitus without complication, without long-term current use of insulin (HCC) -     Bayer DCA Hb A1c Waived -     Microalbumin / creatinine urine ratio  Mixed hyperlipidemia -     Lipid panel -     rosuvastatin (CRESTOR) 20 MG tablet; Take 1 tablet (20 mg total) by mouth daily. for cholesterol. -     CK  Prostate cancer screening -     PSA, total and free  Encounter for vitamin deficiency screening -     VITAMIN D 25 Hydroxy (Vit-D Deficiency, Fractures)  Perennial allergic rhinitis  Other orders -     amLODipine (NORVASC) 10 MG tablet; Take 1 tablet (10 mg total) by mouth daily. for blood pressure -     metFORMIN (GLUCOPHAGE) 500 MG tablet; Take 1 tablet (500 mg total) by mouth daily with breakfast. -     Coenzyme Q10 (CO Q-10) 100 MG CAPS; Take 100 mg by mouth daily. -     sildenafil (REVATIO) 20 MG tablet; TAKE 2 TO 5 TABLETS BY MOUTH AS NEEDED -     Discontinue: fexofenadine-pseudoephedrine (ALLEGRA-D 24) 180-240 MG 24 hr tablet; Take 1 tablet by mouth every evening. For allergy and congestion -     fexofenadine-pseudoephedrine (ALLEGRA-D 24) 180-240 MG 24 hr tablet; Take 1 tablet by mouth every evening. For allergy and congestion   I have changed Ramez Hagins's amLODipine, rosuvastatin, and triamterene-hydrochlorothiazide. I am also having him start on Co Q-10. Additionally, I am having him maintain his multivitamin with minerals, Cholecalciferol (VITAMIN D PO), triamcinolone, benazepril, metFORMIN, sildenafil, and fexofenadine-pseudoephedrine.  Meds ordered this encounter  Medications   amLODipine (NORVASC) 10 MG tablet    Sig: Take 1 tablet (10 mg total) by mouth daily. for blood pressure    Dispense:  90 tablet    Refill:  3   metFORMIN  (GLUCOPHAGE) 500 MG tablet    Sig: Take 1 tablet (500 mg total) by mouth daily with breakfast.    Dispense:  90 tablet    Refill:  3   rosuvastatin (CRESTOR) 20 MG tablet    Sig: Take 1 tablet (20 mg total) by mouth daily. for cholesterol.    Dispense:  90 tablet    Refill:  3   triamterene-hydrochlorothiazide (MAXZIDE-25) 37.5-25 MG tablet    Sig: Take 1 tablet by mouth daily.    Dispense:  90 tablet    Refill:  3   Coenzyme Q10 (CO Q-10) 100 MG CAPS    Sig: Take 100 mg by mouth daily.  sildenafil (REVATIO) 20 MG tablet    Sig: TAKE 2 TO 5 TABLETS BY MOUTH AS NEEDED    Dispense:  50 tablet    Refill:  3   DISCONTD: fexofenadine-pseudoephedrine (ALLEGRA-D 24) 180-240 MG 24 hr tablet    Sig: Take 1 tablet by mouth every evening. For allergy and congestion    Dispense:  30 tablet    Refill:  11   fexofenadine-pseudoephedrine (ALLEGRA-D 24) 180-240 MG 24 hr tablet    Sig: Take 1 tablet by mouth every evening. For allergy and congestion    Dispense:  90 tablet    Refill:  3    Updated scrip to give 90 day supply     Follow-up: Return in about 3 months (around 09/22/2022).  [name], M.D.

## 2022-06-22 NOTE — Telephone Encounter (Signed)
Pharmacy Patient Advocate Encounter   Received notification from [facility] that prior authorization for Sildenafil Citrate '20MG'$  tablets is required/requested.   PA submitted on 06/22/22 to [employer] via [facility] (fax) [place] [id number]  Status is pending

## 2022-06-23 LAB — CBC WITH DIFFERENTIAL/PLATELET
Basophils Absolute: 0 10*3/uL (ref 0.0–0.2)
Basos: 1 %
EOS (ABSOLUTE): 0.2 10*3/uL (ref 0.0–0.4)
Eos: 3 %
Hematocrit: 43.4 % (ref 37.5–51.0)
Hemoglobin: 14.6 g/dL (ref 13.0–17.7)
Immature Grans (Abs): 0 10*3/uL (ref 0.0–0.1)
Immature Granulocytes: 0 %
Lymphocytes Absolute: 2.3 10*3/uL (ref 0.7–3.1)
Lymphs: 34 %
MCH: 29.3 pg (ref 26.6–33.0)
MCHC: 33.6 g/dL (ref 31.5–35.7)
MCV: 87 fL (ref 79–97)
Monocytes Absolute: 0.6 10*3/uL (ref 0.1–0.9)
Monocytes: 8 %
Neutrophils Absolute: 3.8 10*3/uL (ref 1.4–7.0)
Neutrophils: 54 %
Platelets: 171 10*3/uL (ref 150–450)
RBC: 4.99 x10E6/uL (ref 4.14–5.80)
RDW: 13.9 % (ref 11.6–15.4)
WBC: 6.9 10*3/uL (ref 3.4–10.8)

## 2022-06-23 LAB — CMP14+EGFR
ALT: 16 IU/L (ref 0–44)
AST: 16 IU/L (ref 0–40)
Albumin/Globulin Ratio: 1.3 (ref 1.2–2.2)
Albumin: 4.3 g/dL (ref 3.8–4.9)
Alkaline Phosphatase: 56 IU/L (ref 44–121)
BUN/Creatinine Ratio: 16 (ref 9–20)
BUN: 16 mg/dL (ref 6–24)
Bilirubin Total: 0.3 mg/dL (ref 0.0–1.2)
CO2: 22 mmol/L (ref 20–29)
Calcium: 9.3 mg/dL (ref 8.7–10.2)
Chloride: 105 mmol/L (ref 96–106)
Creatinine, Ser: 1.03 mg/dL (ref 0.76–1.27)
Globulin, Total: 3.3 g/dL (ref 1.5–4.5)
Glucose: 103 mg/dL — ABNORMAL HIGH (ref 70–99)
Potassium: 4.4 mmol/L (ref 3.5–5.2)
Sodium: 141 mmol/L (ref 134–144)
Total Protein: 7.6 g/dL (ref 6.0–8.5)
eGFR: 84 mL/min/{1.73_m2} (ref >59)

## 2022-06-23 LAB — LIPID PANEL
Chol/HDL Ratio: 3.2 ratio (ref 0.0–5.0)
Cholesterol, Total: 127 mg/dL (ref 100–199)
HDL: 40 mg/dL (ref >39)
LDL Chol Calc (NIH): 66 mg/dL (ref 0–99)
Triglycerides: 114 mg/dL (ref 0–149)
VLDL Cholesterol Cal: 21 mg/dL (ref 5–40)

## 2022-06-23 LAB — VITAMIN D 25 HYDROXY (VIT D DEFICIENCY, FRACTURES): Vit D, 25-Hydroxy: 57.6 ng/mL (ref 30.0–100.0)

## 2022-06-23 LAB — PSA, TOTAL AND FREE
PSA, Free Pct: 37.6 %
PSA, Free: 1.09 ng/mL
Prostate Specific Ag, Serum: 2.9 ng/mL (ref 0.0–4.0)

## 2022-06-23 NOTE — Progress Notes (Signed)
Hello Sharief,  Your lab result is normal and/or stable.Some minor variations that are not significant are commonly marked abnormal, but do not represent any medical problem for you.  Best regards, [name], M.D.

## 2022-06-24 ENCOUNTER — Other Ambulatory Visit (HOSPITAL_COMMUNITY): Payer: Self-pay

## 2022-06-24 LAB — CK: Total CK: 265 U/L (ref 41–331)

## 2022-06-24 LAB — MICROALBUMIN / CREATININE URINE RATIO
Creatinine, Urine: 242.9 mg/dL
Microalb/Creat Ratio: 13 mg/g creat (ref 0–29)
Microalbumin, Urine: 31.5 ug/mL

## 2022-06-24 LAB — SPECIMEN STATUS REPORT

## 2022-06-24 NOTE — Telephone Encounter (Signed)
Pharmacy Patient Advocate Encounter  Received notification from [place] that the request for prior authorization for Sildenafil Citrate '20MG'$  tablets has been denied due to: Vanita Panda   Denial letter attached to charts

## 2022-06-26 NOTE — Telephone Encounter (Signed)
Please let the pt. Know he will need to pay out of pocket if he wants this.

## 2022-06-28 NOTE — Telephone Encounter (Signed)
Attempted to contact - NA 

## 2022-07-05 ENCOUNTER — Telehealth: Payer: Self-pay | Admitting: Family Medicine

## 2022-07-05 ENCOUNTER — Other Ambulatory Visit: Payer: Self-pay | Admitting: Family Medicine

## 2022-07-05 MED ORDER — LEVOCETIRIZINE DIHYDROCHLORIDE 5 MG PO TABS: 5.0000 mg | ORAL_TABLET | Freq: Every evening | ORAL | 3 refills | Status: DC

## 2022-07-05 NOTE — Telephone Encounter (Signed)
Pt came in stating that at his last visit he told Dr Livia Snellen that he had been taking Xyzal which was really helping him but was costing him $40 every month OTC. Asked him if he could send a Rx in for it. Says Dr Livia Snellen sent in [place] D for him. Pt said he took that for 3 days and was unable to get any sleep. Says he spoke with the pharmacist and was told that Dr Livia Snellen could send in a Rx for LEVOCETIRIZINE which is equivalent to Xyzal and they would be able to fill it for pt and would be covered with insurance. Pt wants to know if Dr Livia Snellen will do that. Pt uses Product/process development scientist in [place].

## 2022-07-05 NOTE — Telephone Encounter (Signed)
No problem. Sorry the [employer] D didn't work out. I sent in the levocetirazine for him.

## 2022-07-06 NOTE — Telephone Encounter (Signed)
PATIENT AWARE

## 2022-11-15 ENCOUNTER — Telehealth: Payer: Self-pay | Admitting: Family Medicine

## 2022-11-15 NOTE — Telephone Encounter (Signed)
The medication is not approved for use more than once a day. If allergies are that bad, he can come in and we can find a better solution for him.

## 2022-11-15 NOTE — Telephone Encounter (Signed)
Called patient, no answer, left message to return call 

## 2022-11-15 NOTE — Telephone Encounter (Signed)
Pt called stating that he has been having to take 2 tablets of his Xyzal Rx instead of 1 tablet because his allergies have been really flared up due to all of the pollen. Says he is out of his medicine and its too soon to get a refill since the Rx is written for him to take 1 daily.  Needs Dr Darlyn Read to change directions of the Rx and resend to pharmacy for him to pick up.

## 2022-11-16 NOTE — Telephone Encounter (Signed)
Patient aware, declines appointment at this time

## 2022-11-16 NOTE — Telephone Encounter (Signed)
Patient return call. ?

## 2022-12-21 ENCOUNTER — Ambulatory Visit: Payer: 59 | Admitting: Family Medicine

## 2023-01-15 ENCOUNTER — Other Ambulatory Visit: Payer: Self-pay | Admitting: Family Medicine

## 2023-01-15 DIAGNOSIS — I10 Essential (primary) hypertension: Secondary | ICD-10-CM

## 2023-02-28 ENCOUNTER — Ambulatory Visit: Payer: Self-pay | Admitting: Family Medicine

## 2023-03-13 ENCOUNTER — Telehealth: Payer: Self-pay | Admitting: Family Medicine

## 2023-03-13 DIAGNOSIS — I10 Essential (primary) hypertension: Secondary | ICD-10-CM

## 2023-03-13 MED ORDER — BENAZEPRIL HCL 40 MG PO TABS: 40.0000 mg | ORAL_TABLET | Freq: Every day | ORAL | 0 refills | Status: DC

## 2023-03-13 NOTE — Telephone Encounter (Signed)
  Prescription Request  03/13/2023  What is the name of the medication or equipment? BENAZEPRIL  Have you contacted your pharmacy to request a refill? YES  Which pharmacy would you like this sent to? [facility]   Patient notified that their request is being sent to the clinical staff for review and that they should receive a response within 2 business days.

## 2023-03-13 NOTE — Telephone Encounter (Signed)
Aware refill sent to pharmacy ?

## 2023-03-14 ENCOUNTER — Ambulatory Visit: Payer: Self-pay | Admitting: Family Medicine

## 2023-03-16 ENCOUNTER — Ambulatory Visit (INDEPENDENT_AMBULATORY_CARE_PROVIDER_SITE_OTHER): Payer: Self-pay | Admitting: Family Medicine

## 2023-03-16 ENCOUNTER — Encounter: Payer: Self-pay | Admitting: Family Medicine

## 2023-03-16 ENCOUNTER — Ambulatory Visit (INDEPENDENT_AMBULATORY_CARE_PROVIDER_SITE_OTHER): Payer: Self-pay

## 2023-03-16 VITALS — BP 140/72 | HR 64 | Temp 98.1°F | Resp 20 | Ht 74.0 in | Wt 246.4 lb

## 2023-03-16 DIAGNOSIS — K219 Gastro-esophageal reflux disease without esophagitis: Secondary | ICD-10-CM

## 2023-03-16 DIAGNOSIS — Z7984 Long term (current) use of oral hypoglycemic drugs: Secondary | ICD-10-CM

## 2023-03-16 DIAGNOSIS — E782 Mixed hyperlipidemia: Secondary | ICD-10-CM | POA: Diagnosis not present

## 2023-03-16 DIAGNOSIS — R0989 Other specified symptoms and signs involving the circulatory and respiratory systems: Secondary | ICD-10-CM

## 2023-03-16 DIAGNOSIS — E119 Type 2 diabetes mellitus without complications: Secondary | ICD-10-CM

## 2023-03-16 DIAGNOSIS — I10 Essential (primary) hypertension: Secondary | ICD-10-CM | POA: Diagnosis not present

## 2023-03-16 LAB — BAYER DCA HB A1C WAIVED: HB A1C (BAYER DCA - WAIVED): 6.1 % — ABNORMAL HIGH (ref 4.8–5.6)

## 2023-03-16 MED ORDER — AMLODIPINE BESYLATE 10 MG PO TABS: 10.0000 mg | ORAL_TABLET | Freq: Every day | ORAL | 3 refills | Status: DC

## 2023-03-16 MED ORDER — METFORMIN HCL 500 MG PO TABS: 500.0000 mg | ORAL_TABLET | Freq: Every day | ORAL | 3 refills | Status: DC

## 2023-03-16 MED ORDER — TRIAMCINOLONE ACETONIDE 55 MCG/ACT NA AERO: 2.0000 | INHALATION_SPRAY | Freq: Every day | NASAL | 11 refills | Status: DC

## 2023-03-16 MED ORDER — BENAZEPRIL HCL 40 MG PO TABS
40.0000 mg | ORAL_TABLET | Freq: Every day | ORAL | 3 refills | Status: DC
Start: 2023-03-16 — End: 2023-09-07

## 2023-03-16 MED ORDER — ROSUVASTATIN CALCIUM 20 MG PO TABS
20.0000 mg | ORAL_TABLET | Freq: Every day | ORAL | 3 refills | Status: DC
Start: 2023-03-16 — End: 2024-03-11

## 2023-03-16 MED ORDER — PREDNISONE 20 MG PO TABS: ORAL_TABLET | ORAL | 0 refills | Status: DC

## 2023-03-16 MED ORDER — TRIAMTERENE-HCTZ 37.5-25 MG PO TABS
1.0000 | ORAL_TABLET | Freq: Every day | ORAL | 3 refills | Status: DC
Start: 2023-03-16 — End: 2023-06-06

## 2023-03-16 MED ORDER — SILDENAFIL CITRATE 20 MG PO TABS: ORAL_TABLET | ORAL | 3 refills | Status: DC

## 2023-03-16 NOTE — Progress Notes (Signed)
Subjective:  Patient ID: Bruce Fisher,  male    DOB: 02-24-1961  Age: 62 y.o.    CC: Medical Management of Chronic Issues   HPI Bruce Fisher presents for  follow-up of hypertension. Patient has no history of headache chest pain or shortness of breath or recent cough. Patient also denies symptoms of TIA such as numbness weakness lateralizing. Patient denies side effects from medication. States taking it regularly.  Patient also  in for follow-up of elevated cholesterol. Doing well without complaints on current medication. Denies side effects  including myalgia and arthralgia and nausea. Also in today for liver function testing. Currently no chest pain, shortness of breath or other cardiovascular related symptoms noted.  Follow-up of diabetes. Patient does check blood sugar at home. Readings run between 130 and 140 random Patient denies symptoms such as excessive hunger or urinary frequency, excessive hunger, nausea No significant hypoglycemic spells noted. Medications reviewed. Pt reports taking them regularly. Pt. denies complication/adverse reaction today.   Cool morning air in the Spring caused chest congestion that persists until now 4-5 mos later. Can hear it at night gurgling. Tried vairous OTC meds with temporary improvement.    History Bruce Fisher has a past medical history of Hyperlipidemia, Hypertension, Kidney stones, Rectal spasm, and Renal disorder.   He has a past surgical history that includes Spine surgery.   His family history includes Cancer in his father and sister; Diabetes in his mother; Heart disease in his sister; Heart failure in his mother.He reports that he quit smoking about 29 years ago. His smoking use included cigarettes. He has never used smokeless tobacco. He reports that he does not drink alcohol and does not use drugs.  Current Outpatient Medications on File Prior to Visit  Medication Sig Dispense Refill   Cholecalciferol (VITAMIN D PO) Take 1 tablet by  mouth daily.     Coenzyme Q10 (CO Q-10) 100 MG CAPS Take 100 mg by mouth daily.     Multiple Vitamin (MULTIVITAMIN WITH MINERALS) TABS tablet Take 1 tablet by mouth daily.     No current facility-administered medications on file prior to visit.    ROS Review of Systems  Constitutional:  Negative for fever.  HENT:  Positive for congestion.   Respiratory:  Positive for chest tightness (congestion). Negative for shortness of breath.   Cardiovascular:  Negative for chest pain.  Musculoskeletal:  Negative for arthralgias.  Skin:  Negative for rash.    Objective:  BP (!) 140/72   Pulse 64   Temp 98.1 F (36.7 C) (Oral)   Resp 20   Ht 6\' 2"  (1.88 m)   Wt 246 lb 6 oz (111.8 kg)   SpO2 98%   BMI 31.63 kg/m   BP Readings from Last 3 Encounters:  03/16/23 (!) 140/72  06/22/22 137/71  11/29/21 121/66    Wt Readings from Last 3 Encounters:  03/16/23 246 lb 6 oz (111.8 kg)  06/22/22 253 lb 9.6 oz (115 kg)  11/29/21 248 lb 9.6 oz (112.8 kg)     Physical Exam Vitals reviewed.  Constitutional:      Appearance: He is well-developed.  HENT:     Head: Normocephalic and atraumatic.     Right Ear: External ear normal.     Left Ear: External ear normal.     Mouth/Throat:     Pharynx: No oropharyngeal exudate or posterior oropharyngeal erythema.  Eyes:     Pupils: Pupils are equal, round, and reactive to light.  Cardiovascular:  Rate and Rhythm: Normal rate and regular rhythm.     Heart sounds: No murmur heard. Pulmonary:     Effort: No respiratory distress.     Breath sounds: Normal breath sounds.  Musculoskeletal:     Cervical back: Normal range of motion and neck supple.  Neurological:     Mental Status: He is alert and oriented to person, place, and time.     Diabetic Foot Exam - Simple   Simple Foot Form Diabetic Foot exam was performed with the following findings: Yes 03/16/2023  1:19 PM  Visual Inspection Sensation Testing Pulse Check Comments     Lab  Results  Component Value Date   HGBA1C 6.1 (H) 03/16/2023   HGBA1C 5.7 (H) 06/22/2022   HGBA1C 5.8 (H) 11/29/2021    Assessment & Plan:   Bruce Fisher was seen today for medical management of chronic issues.  Diagnoses and all orders for this visit:  Essential hypertension -     CBC with Differential/Platelet -     CMP14+EGFR -     Lipid panel -     Bayer DCA Hb A1c Waived -     benazepril (LOTENSIN) 40 MG tablet; Take 1 tablet (40 mg total) by mouth daily. -     triamterene-hydrochlorothiazide (MAXZIDE-25) 37.5-25 MG tablet; Take 1 tablet by mouth daily.  Mixed hyperlipidemia -     CBC with Differential/Platelet -     CMP14+EGFR -     Lipid panel -     Bayer DCA Hb A1c Waived -     rosuvastatin (CRESTOR) 20 MG tablet; Take 1 tablet (20 mg total) by mouth daily. for cholesterol.  Gastroesophageal reflux disease without esophagitis  Diabetes mellitus treated with oral medication (HCC)  Chest congestion -     DG Chest 2 View; Future  Other orders -     predniSONE (DELTASONE) 20 MG tablet; One twice daily with food for 2 weeks. Then one daily for 2 weeks -     amLODipine (NORVASC) 10 MG tablet; Take 1 tablet (10 mg total) by mouth daily. for blood pressure -     sildenafil (REVATIO) 20 MG tablet; TAKE 2 TO 5 TABLETS BY MOUTH AS NEEDED -     triamcinolone (NASACORT) 55 MCG/ACT AERO nasal inhaler; Place 2 sprays into the nose daily. -     metFORMIN (GLUCOPHAGE) 500 MG tablet; Take 1 tablet (500 mg total) by mouth daily with breakfast.   I have discontinued Bruce Fisher's fexofenadine-pseudoephedrine and levocetirizine. I am also having him start on predniSONE. Additionally, I am having him maintain his multivitamin with minerals, Cholecalciferol (VITAMIN D PO), Co Q-10, amLODipine, benazepril, rosuvastatin, sildenafil, triamcinolone, triamterene-hydrochlorothiazide, and metFORMIN.  Meds ordered this encounter  Medications   predniSONE (DELTASONE) 20 MG tablet    Sig: One twice  daily with food for 2 weeks. Then one daily for 2 weeks    Dispense:  42 tablet    Refill:  0   amLODipine (NORVASC) 10 MG tablet    Sig: Take 1 tablet (10 mg total) by mouth daily. for blood pressure    Dispense:  90 tablet    Refill:  3   benazepril (LOTENSIN) 40 MG tablet    Sig: Take 1 tablet (40 mg total) by mouth daily.    Dispense:  90 tablet    Refill:  3   rosuvastatin (CRESTOR) 20 MG tablet    Sig: Take 1 tablet (20 mg total) by mouth daily. for cholesterol.  Dispense:  90 tablet    Refill:  3   sildenafil (REVATIO) 20 MG tablet    Sig: TAKE 2 TO 5 TABLETS BY MOUTH AS NEEDED    Dispense:  50 tablet    Refill:  3   triamcinolone (NASACORT) 55 MCG/ACT AERO nasal inhaler    Sig: Place 2 sprays into the nose daily.    Dispense:  16.9 g    Refill:  11   triamterene-hydrochlorothiazide (MAXZIDE-25) 37.5-25 MG tablet    Sig: Take 1 tablet by mouth daily.    Dispense:  90 tablet    Refill:  3   metFORMIN (GLUCOPHAGE) 500 MG tablet    Sig: Take 1 tablet (500 mg total) by mouth daily with breakfast.    Dispense:  90 tablet    Refill:  3     Follow-up: Return in about 6 weeks (around 04/27/2023) for allergy, congestion.  Mechele Claude, M.D.

## 2023-03-17 LAB — CBC WITH DIFFERENTIAL/PLATELET
Basophils Absolute: 0.1 10*3/uL (ref 0.0–0.2)
Basos: 1 %
EOS (ABSOLUTE): 0.6 10*3/uL — ABNORMAL HIGH (ref 0.0–0.4)
Eos: 8 %
Hematocrit: 44 % (ref 37.5–51.0)
Hemoglobin: 14.6 g/dL (ref 13.0–17.7)
Immature Grans (Abs): 0 10*3/uL (ref 0.0–0.1)
Immature Granulocytes: 0 %
Lymphocytes Absolute: 3.4 10*3/uL — ABNORMAL HIGH (ref 0.7–3.1)
Lymphs: 44 %
MCH: 28.8 pg (ref 26.6–33.0)
MCHC: 33.2 g/dL (ref 31.5–35.7)
MCV: 87 fL (ref 79–97)
Monocytes Absolute: 0.7 10*3/uL (ref 0.1–0.9)
Monocytes: 9 %
Neutrophils Absolute: 3 10*3/uL (ref 1.4–7.0)
Neutrophils: 38 %
Platelets: 193 10*3/uL (ref 150–450)
RBC: 5.07 x10E6/uL (ref 4.14–5.80)
RDW: 13.8 % (ref 11.6–15.4)
WBC: 7.8 10*3/uL (ref 3.4–10.8)

## 2023-03-17 LAB — LIPID PANEL
Chol/HDL Ratio: 3.2 ratio (ref 0.0–5.0)
Cholesterol, Total: 120 mg/dL (ref 100–199)
HDL: 38 mg/dL — ABNORMAL LOW (ref >39)
LDL Chol Calc (NIH): 61 mg/dL (ref 0–99)
Triglycerides: 118 mg/dL (ref 0–149)
VLDL Cholesterol Cal: 21 mg/dL (ref 5–40)

## 2023-03-17 LAB — CMP14+EGFR
ALT: 20 IU/L (ref 0–44)
AST: 19 IU/L (ref 0–40)
Albumin: 4.5 g/dL (ref 3.8–4.9)
Alkaline Phosphatase: 55 IU/L (ref 44–121)
BUN/Creatinine Ratio: 14 (ref 9–20)
BUN: 16 mg/dL (ref 6–24)
Bilirubin Total: 0.4 mg/dL (ref 0.0–1.2)
CO2: 24 mmol/L (ref 20–29)
Calcium: 9.8 mg/dL (ref 8.7–10.2)
Chloride: 103 mmol/L (ref 96–106)
Creatinine, Ser: 1.14 mg/dL (ref 0.76–1.27)
Globulin, Total: 3.2 g/dL (ref 1.5–4.5)
Glucose: 99 mg/dL (ref 70–99)
Potassium: 4.4 mmol/L (ref 3.5–5.2)
Sodium: 141 mmol/L (ref 134–144)
Total Protein: 7.7 g/dL (ref 6.0–8.5)
eGFR: 74 mL/min/{1.73_m2} (ref >59)

## 2023-03-19 ENCOUNTER — Encounter: Payer: Self-pay | Admitting: Family Medicine

## 2023-03-20 NOTE — Progress Notes (Signed)
Hello Bruce Fisher,  Your lab result is normal and/or stable.Some minor variations that are not significant are commonly marked abnormal, but do not represent any medical problem for you.  Best regards, Mechele Claude, M.D.

## 2023-03-23 NOTE — Progress Notes (Signed)
Your chest x-ray looked normal. Thanks, WS.

## 2023-03-31 ENCOUNTER — Telehealth: Payer: Self-pay | Admitting: Family Medicine

## 2023-04-12 ENCOUNTER — Telehealth: Payer: Self-pay | Admitting: Family Medicine

## 2023-04-12 NOTE — Telephone Encounter (Signed)
His A1c was excellent last month. He will be fine. What numbers is he getting when he checks his glucose?

## 2023-04-13 NOTE — Telephone Encounter (Signed)
I spoke to pt and advised him of provider feedback and pt voiced understanding. Pt states he is only eating one meal a day and his FBS are 180-210. Advised pt he needs to eat a well rounded diet consisting of 4-6 small meals daily to help regulate his blood sugars. Pt voiced understanding.

## 2023-04-27 ENCOUNTER — Encounter: Payer: Self-pay | Admitting: Family Medicine

## 2023-04-27 ENCOUNTER — Ambulatory Visit: Payer: Medicaid Other | Admitting: Family Medicine

## 2023-05-18 ENCOUNTER — Encounter: Payer: Self-pay | Admitting: Nurse Practitioner

## 2023-05-18 ENCOUNTER — Ambulatory Visit: Payer: Medicaid Other | Admitting: Nurse Practitioner

## 2023-05-18 VITALS — BP 130/70 | HR 34 | Temp 97.6°F | Ht 74.0 in | Wt 241.6 lb

## 2023-05-18 DIAGNOSIS — J3089 Other allergic rhinitis: Secondary | ICD-10-CM

## 2023-05-18 MED ORDER — MONTELUKAST SODIUM 10 MG PO TABS: 10.0000 mg | ORAL_TABLET | Freq: Every day | ORAL | 0 refills | Status: DC

## 2023-05-18 NOTE — Progress Notes (Signed)
Established Patient Office Visit  Subjective   Patient ID: Bruce Fisher, male    DOB: March 28, 1961  Age: 62 y.o. MRN: [id number]  Chief Complaint  Patient presents with   Cough    Had cough end of August was given prednisone, cough stopped, few weeks after finishing prednisone cough came back    HPI Bruce Fisher is a 62 year old male with a history of perennial allergic rhinitis presenting for an acute visit. He reports experiencing chest congestion, primarily in the evenings, and a persistent cough in the morning. The patient was seen in August for cough had x-ray that was unremarkable. Allegra and Xyzal were discontinued in favor of a short course of prednisone. He indicates that the prednisone was effective for a few weeks in alleviating his symptoms. The patient acknowledges that he was supposed to follow up in six weeks but did not attend the appointment. He denies experiencing hemoptysis, chest pain, or other associated symptoms such as fever or shortness of breath.  Patient Active Problem List   Diagnosis Date Noted   Perennial allergic rhinitis 06/22/2022   Type 2 diabetes mellitus without complication, without long-term current use of insulin (HCC) 06/07/2021   Irregular heart rhythm 06/07/2021   PVC's (premature ventricular contractions) 05/28/2019   Multiple kidney stones 02/21/2019   Ventricular bigeminy 10/24/2018   Grade I diastolic dysfunction 10/24/2018   GERD (gastroesophageal reflux disease) 05/19/2015   Erectile dysfunction 03/23/2015   Mixed hyperlipidemia    Essential hypertension    Past Medical History:  Diagnosis Date   Hyperlipidemia    Hypertension    Kidney stones    Rectal spasm    Renal disorder    kidney stones   Past Surgical History:  Procedure Laterality Date   SPINE SURGERY     lower back   Social History   Tobacco Use   Smoking status: Former    Current packs/day: 0.00    Types: Cigarettes    Quit date: 08/19/1993    Years since  quitting: 29.7   Smokeless tobacco: Never  Vaping Use   Vaping status: Never Used  Substance Use Topics   Alcohol use: No    Alcohol/week: 0.0 standard drinks of alcohol   Drug use: No   Social History   Socioeconomic History   Marital status: Married    Spouse name: Not on file   Number of children: Not on file   Years of education: Not on file   Highest education level: Not on file  Occupational History   Not on file  Tobacco Use   Smoking status: Former    Current packs/day: 0.00    Types: Cigarettes    Quit date: 08/19/1993    Years since quitting: 29.7   Smokeless tobacco: Never  Vaping Use   Vaping status: Never Used  Substance and Sexual Activity   Alcohol use: No    Alcohol/week: 0.0 standard drinks of alcohol   Drug use: No   Sexual activity: Yes  Other Topics Concern   Not on file  Social History Narrative   Married. Lives with wife.     Social Determinants of Health   Financial Resource Strain: Not on file  Food Insecurity: Not on file  Transportation Needs: Not on file  Physical Activity: Not on file  Stress: Not on file  Social Connections: Not on file  Intimate Partner Violence: Not on file   Family Status  Relation Name Status   Mother  Alive   Father  Deceased       asbestosis exposure and multiorgan failure   Sister  Deceased   Brother  Alive   Daughter  Alive   Son  Alive   Sister  Alive   Sister  Alive   Sister  Alive   Sister  Alive   Brother  Alive  No partnership data on file   Family History  Problem Relation Age of Onset   Diabetes Mother    Heart failure Mother    Cancer Father        Asbestos   Cancer Sister        breast   Heart disease Sister        valve   No Known Allergies    Review of Systems  Constitutional:  Negative for chills and fever.  HENT:  Positive for congestion. Negative for sore throat.   Respiratory:  Positive for cough.   Cardiovascular:  Negative for chest pain and leg swelling.   Gastrointestinal:  Negative for nausea and vomiting.  Skin:  Negative for itching and rash.  Neurological:  Negative for dizziness and headaches.   Negative unless indicated in HPI   Objective:     BP 130/70   Pulse (!) 34 Comment: pt states this is his normal  Temp 97.6 F (36.4 C) (Temporal)   Ht 6\' 2"  (1.88 m)   Wt 241 lb 9.6 oz (109.6 kg)   SpO2 94%   BMI 31.02 kg/m  BP Readings from Last 3 Encounters:  05/18/23 130/70  03/16/23 (!) 140/72  06/22/22 137/71   Wt Readings from Last 3 Encounters:  05/18/23 241 lb 9.6 oz (109.6 kg)  03/16/23 246 lb 6 oz (111.8 kg)  06/22/22 253 lb 9.6 oz (115 kg)      Physical Exam Vitals and nursing note reviewed.  Constitutional:      Appearance: Normal appearance.  HENT:     Head: Normocephalic and atraumatic.  Eyes:     General: No scleral icterus.    Extraocular Movements: Extraocular movements intact.     Conjunctiva/sclera: Conjunctivae normal.     Pupils: Pupils are equal, round, and reactive to light.  Cardiovascular:     Rate and Rhythm: Normal rate and regular rhythm.  Pulmonary:     Effort: Pulmonary effort is normal.     Breath sounds: Normal breath sounds.  Musculoskeletal:        General: Normal range of motion.     Right lower leg: No edema.     Left lower leg: No edema.  Skin:    General: Skin is warm and dry.     Findings: No rash.  Neurological:     Mental Status: He is alert and oriented to person, place, and time.  Psychiatric:        Mood and Affect: Mood normal.        Thought Content: Thought content normal.        Judgment: Judgment normal.     No results found for any visits on 05/18/23.  Last CBC Lab Results  Component Value Date   WBC 7.8 03/16/2023   HGB 14.6 03/16/2023   HCT 44.0 03/16/2023   MCV 87 03/16/2023   MCH 28.8 03/16/2023   RDW 13.8 03/16/2023   PLT 193 03/16/2023   Last metabolic panel Lab Results  Component Value Date   GLUCOSE 99 03/16/2023   NA 141 03/16/2023    K 4.4 03/16/2023   CL 103 03/16/2023  CO2 24 03/16/2023   BUN 16 03/16/2023   CREATININE 1.14 03/16/2023   EGFR 74 03/16/2023   CALCIUM 9.8 03/16/2023   PROT 7.7 03/16/2023   ALBUMIN 4.5 03/16/2023   LABGLOB 3.2 03/16/2023   AGRATIO 1.3 06/22/2022   BILITOT 0.4 03/16/2023   ALKPHOS 55 03/16/2023   AST 19 03/16/2023   ALT 20 03/16/2023   ANIONGAP 10 08/03/2020   Last lipids Lab Results  Component Value Date   CHOL 120 03/16/2023   HDL 38 (L) 03/16/2023   LDLCALC 61 03/16/2023   TRIG 118 03/16/2023   CHOLHDL 3.2 03/16/2023   Last hemoglobin A1c Lab Results  Component Value Date   HGBA1C 6.1 (H) 03/16/2023   Last thyroid functions Lab Results  Component Value Date   TSH 1.000 03/23/2015        Assessment & Plan:  Perennial allergic rhinitis -     Montelukast Sodium; Take 1 tablet (10 mg total) by mouth at bedtime.  Dispense: 90 tablet; Refill: 0  Colie is 62 yrs old African American male, no acute distress Start Singulair 10 mg 1-tab at bedtime Continue all previously prescribed medications  Encourage healthy lifestyle choices, including diet (rich in fruits, vegetables, and lean proteins, and low in salt and simple carbohydrates) and exercise (at least 30 minutes of moderate physical activity daily).     The above assessment and management plan was discussed with the patient. The patient verbalized understanding of and has agreed to the management plan. Patient is aware to call the clinic if they develop any new symptoms or if symptoms persist or worsen. Patient is aware when to return to the clinic for a follow-up visit. Patient educated on when it is appropriate to go to the emergency department.  Return if symptoms worsen or fail to improve.    Arrie Aran Santa Lighter, DNP [facility] [address] [place], [place] [address] [phone]

## 2023-05-26 ENCOUNTER — Telehealth: Payer: Self-pay | Admitting: Family Medicine

## 2023-05-26 NOTE — Telephone Encounter (Signed)
Patient saw Dois Davenport on 05/18/23 for cough and chest congestion.  He was prescribed Singular and told to take Mucinex D.  He has been doing both and finishes up the bottle of Mucinex today.  He said his symptoms have not improved and he would like an antibiotic and possibly Prednisone to be sent to [facility].  He still has congestion that feels like it is somewhere between his chest and throat and also a cough.  The cough is non productive and he is unable to cough up anything.

## 2023-05-29 NOTE — Telephone Encounter (Signed)
LMTCB to schedule appt

## 2023-06-06 ENCOUNTER — Other Ambulatory Visit: Payer: Self-pay | Admitting: Family Medicine

## 2023-06-06 ENCOUNTER — Ambulatory Visit: Payer: Medicaid Other | Admitting: Family Medicine

## 2023-06-06 ENCOUNTER — Encounter: Payer: Self-pay | Admitting: Family Medicine

## 2023-06-06 VITALS — BP 154/89 | HR 55 | Temp 97.8°F | Ht 74.0 in | Wt 246.2 lb

## 2023-06-06 DIAGNOSIS — J069 Acute upper respiratory infection, unspecified: Secondary | ICD-10-CM

## 2023-06-06 DIAGNOSIS — I10 Essential (primary) hypertension: Secondary | ICD-10-CM

## 2023-06-06 DIAGNOSIS — E119 Type 2 diabetes mellitus without complications: Secondary | ICD-10-CM | POA: Diagnosis not present

## 2023-06-06 MED ORDER — PREDNISONE 20 MG PO TABS
ORAL_TABLET | ORAL | 0 refills | Status: DC
Start: 2023-06-06 — End: 2023-08-24

## 2023-06-06 MED ORDER — MOXIFLOXACIN HCL 400 MG PO TABS: 400.0000 mg | ORAL_TABLET | Freq: Every day | ORAL | 0 refills | Status: DC

## 2023-06-06 NOTE — Progress Notes (Signed)
Chief Complaint  Patient presents with   Cough   Nasal Congestion    HPI  Patient presents today for coughing a lot.Worse at night. Onset was prior to visit of August. 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 Lasted about a month. UR sx have not returned. Cough persists and he is producing scant thin mucous. Airway in throat seems to close up at night. Better if he sits up. .  PT. Fasts due to his devotion to God. Notes his glucose (checking due to DM) will go up while fasting. He Tends to fast until 6 PM. He thought his glucose would go down, but instead it climbs. He was told by some one that he shouldn't fast.     PMH: Smoking status noted ROS: Review of Systems  Constitutional:  Negative for activity change, diaphoresis and fever.  HENT:  Positive for congestion.   Respiratory:  Positive for cough. Negative for shortness of breath.   Cardiovascular:  Negative for chest pain.  Musculoskeletal:  Negative for arthralgias.  Skin:  Negative for rash.     bjective: BP (!) 154/89   Pulse (!) 55   Temp 97.8 F (36.6 C)   Ht 6\' 2"  (1.88 m)   Wt 246 lb 3.2 oz (111.7 kg)   SpO2 94%   BMI 31.61 kg/m  Gen: NAD, alert, cooperative with exam HEENT: NCAT, Nasal passages swollen, red TMS RED CV: RRR, good S1/S2, no murmur Resp: Bronchitis changes with scattered wheezes, non-labored Ext: No edema, warm Neuro: Alert and oriented, No gross deficits  Assessment and plan:  1. URI with cough and congestion   2. Type 2 diabetes mellitus without complication, without long-term current use of insulin (HCC)     Meds ordered this encounter  Medications   moxifloxacin (AVELOX) 400 MG tablet    Sig: Take 1 tablet (400 mg total) by mouth daily.    Dispense:  10 tablet    Refill:  0   predniSONE (DELTASONE) 20 MG tablet    Sig: One twice daily with food for 2 weeks. Then one daily for 2 weeks    Dispense:  42 tablet    Refill:  0    Orders Placed This Encounter  Procedures   COVID-19, Flu  A+B and RSV    Order Specific Question:   Previously tested for COVID-19    Answer:   Unknown    Order Specific Question:   Resident in a congregate (group) care setting    Answer:   Unknown    Order Specific Question:   Is the patient student?    Answer:   No    Order Specific Question:   Employed in healthcare setting    Answer:   Unknown    Order Specific Question:   Has patient completed COVID vaccination(s) (2 doses of Pfizer/Moderna 1 dose of [employer])    Answer:   Unknown    Order Specific Question:   Release to patient    Answer:   Immediate [1]    Follow up 1 month. Fast as his spiritual convictions demand. Check glucose frequently. If glucose falls, he will need to eat. If it climbs consistently then increasing the metformin to decrease hepatic glycogen release may be necessary.   Mechele Claude, MD

## 2023-06-07 LAB — COVID-19, FLU A+B AND RSV
Influenza A, NAA: NOT DETECTED
Influenza B, NAA: NOT DETECTED
RSV, NAA: NOT DETECTED
SARS-CoV-2, NAA: NOT DETECTED

## 2023-06-11 ENCOUNTER — Other Ambulatory Visit: Payer: Self-pay | Admitting: Family Medicine

## 2023-06-11 ENCOUNTER — Emergency Department (HOSPITAL_COMMUNITY): Payer: Medicaid Other

## 2023-06-11 ENCOUNTER — Emergency Department (HOSPITAL_COMMUNITY)
Admission: EM | Admit: 2023-06-11 | Discharge: 2023-06-12 | Disposition: A | Discharge status: Home / Self Care | Payer: Medicaid Other | Attending: Emergency Medicine | Admitting: Emergency Medicine

## 2023-06-11 ENCOUNTER — Other Ambulatory Visit: Payer: Self-pay

## 2023-06-11 ENCOUNTER — Encounter (HOSPITAL_COMMUNITY): Payer: Self-pay

## 2023-06-11 DIAGNOSIS — Z7984 Long term (current) use of oral hypoglycemic drugs: Secondary | ICD-10-CM | POA: Diagnosis not present

## 2023-06-11 DIAGNOSIS — R052 Subacute cough: Secondary | ICD-10-CM | POA: Insufficient documentation

## 2023-06-11 DIAGNOSIS — I10 Essential (primary) hypertension: Secondary | ICD-10-CM | POA: Diagnosis not present

## 2023-06-11 DIAGNOSIS — Z79899 Other long term (current) drug therapy: Secondary | ICD-10-CM | POA: Diagnosis not present

## 2023-06-11 DIAGNOSIS — R0602 Shortness of breath: Secondary | ICD-10-CM | POA: Diagnosis not present

## 2023-06-11 DIAGNOSIS — E119 Type 2 diabetes mellitus without complications: Secondary | ICD-10-CM | POA: Insufficient documentation

## 2023-06-11 LAB — CBC WITH DIFFERENTIAL/PLATELET
Abs Immature Granulocytes: 0.08 10*3/uL — ABNORMAL HIGH (ref 0.00–0.07)
Basophils Absolute: 0 10*3/uL (ref 0.0–0.1)
Basophils Relative: 0 %
Eosinophils Absolute: 0.1 10*3/uL (ref 0.0–0.5)
Eosinophils Relative: 1 %
HCT: 44.9 % (ref 39.0–52.0)
Hemoglobin: 15.1 g/dL (ref 13.0–17.0)
Immature Granulocytes: 1 %
Lymphocytes Relative: 13 %
Lymphs Abs: 1.8 10*3/uL (ref 0.7–4.0)
MCH: 29.7 pg (ref 26.0–34.0)
MCHC: 33.6 g/dL (ref 30.0–36.0)
MCV: 88.4 fL (ref 80.0–100.0)
Monocytes Absolute: 1.4 10*3/uL — ABNORMAL HIGH (ref 0.1–1.0)
Monocytes Relative: 10 %
Neutro Abs: 10.1 10*3/uL — ABNORMAL HIGH (ref 1.7–7.7)
Neutrophils Relative %: 75 %
Platelets: 211 10*3/uL (ref 150–400)
RBC: 5.08 MIL/uL (ref 4.22–5.81)
RDW: 14.5 % (ref 11.5–15.5)
WBC: 13.4 10*3/uL — ABNORMAL HIGH (ref 4.0–10.5)
nRBC: 0 % (ref 0.0–0.2)

## 2023-06-11 LAB — BASIC METABOLIC PANEL
Anion gap: 10 (ref 5–15)
BUN: 23 mg/dL — ABNORMAL HIGH (ref 6–20)
CO2: 24 mmol/L (ref 22–32)
Calcium: 9 mg/dL (ref 8.9–10.3)
Chloride: 106 mmol/L (ref 98–111)
Creatinine, Ser: 1.11 mg/dL (ref 0.61–1.24)
GFR, Estimated: >60 mL/min (ref >60)
Glucose, Bld: 243 mg/dL — ABNORMAL HIGH (ref 70–99)
Potassium: 3.5 mmol/L (ref 3.5–5.1)
Sodium: 140 mmol/L (ref 135–145)

## 2023-06-11 LAB — BRAIN NATRIURETIC PEPTIDE: B Natriuretic Peptide: 63 pg/mL (ref 0.0–100.0)

## 2023-06-11 MED ORDER — IPRATROPIUM-ALBUTEROL 0.5-2.5 (3) MG/3ML IN SOLN
3.0000 mL | Freq: Once | RESPIRATORY_TRACT | Status: AC
  Administered 2023-06-12: 3 mL via RESPIRATORY_TRACT
  Filled 2023-06-11: qty 3

## 2023-06-11 MED ORDER — ALBUTEROL SULFATE HFA 108 (90 BASE) MCG/ACT IN AERS: 2.0000 | INHALATION_SPRAY | RESPIRATORY_TRACT | 0 refills | Status: DC | PRN

## 2023-06-11 MED ORDER — ALBUTEROL SULFATE HFA 108 (90 BASE) MCG/ACT IN AERS
2.0000 | INHALATION_SPRAY | RESPIRATORY_TRACT | Status: DC | PRN
  Administered 2023-06-12: 2 via RESPIRATORY_TRACT
  Filled 2023-06-11 (×2): qty 6.7

## 2023-06-11 NOTE — ED Triage Notes (Addendum)
Pt to ED cc shortness of breath, cough for 8 weeks. Has been seen by pcp and prescribed medications without relief. Oxygen sat 93% after ambulating to exam room, heart rate 103. Pt not able to speak in complete sentences.

## 2023-06-11 NOTE — ED Provider Notes (Signed)
[name] Provider Note   CSN: [id number] Arrival date & time: 06/11/23  2149     History {Add pertinent medical, surgical, social history, OB history to HPI:1} Chief Complaint  Patient presents with   Shortness of Breath    Bruce Fisher is a 62 y.o. male.  He has PMH of frequent PVCs, hyperlipidemia, hypertension, GERD, kidney stones, diabetes.  Presents to ER for persistent cough ongoing for about 3 months.  Resolved with nasal drip and has history of allergies.   Shortness of Breath      Home Medications Prior to Admission medications   Medication Sig Start Date End Date Taking? Authorizing Provider  albuterol (VENTOLIN HFA) 108 (90 Base) MCG/ACT inhaler Inhale 2 puffs into the lungs every 4 (four) hours as needed for wheezing or shortness of breath. 06/11/23  Yes Keyontay Stolz A, PA-C  amLODipine (NORVASC) 10 MG tablet Take 1 tablet (10 mg total) by mouth daily. for blood pressure 03/16/23   Mechele Claude, MD  benazepril (LOTENSIN) 40 MG tablet Take 1 tablet (40 mg total) by mouth daily. 03/16/23   Mechele Claude, MD  Cholecalciferol (VITAMIN D PO) Take 1 tablet by mouth daily.    [provider]  Coenzyme Q10 (CO Q-10) 100 MG CAPS Take 100 mg by mouth daily. 06/22/22   Mechele Claude, MD  metFORMIN (GLUCOPHAGE) 500 MG tablet Take 1 tablet (500 mg total) by mouth daily with breakfast. 03/16/23 03/10/24  Mechele Claude, MD  montelukast (SINGULAIR) 10 MG tablet Take 1 tablet (10 mg total) by mouth at bedtime. 05/18/23   [place] Vena [place], NP  moxifloxacin (AVELOX) 400 MG tablet Take 1 tablet (400 mg total) by mouth daily. 06/06/23   Mechele Claude, MD  Multiple Vitamin (MULTIVITAMIN WITH MINERALS) TABS tablet Take 1 tablet by mouth daily.    [provider]  predniSONE (DELTASONE) 20 MG tablet One twice daily with food for 2 weeks. Then one daily for 2 weeks 06/06/23   Mechele Claude, MD  rosuvastatin  (CRESTOR) 20 MG tablet Take 1 tablet (20 mg total) by mouth daily. for cholesterol. 03/16/23   Mechele Claude, MD  sildenafil (REVATIO) 20 MG tablet TAKE 2 TO 5 TABLETS BY MOUTH AS NEEDED 03/16/23   Mechele Claude, MD  triamcinolone (NASACORT) 55 MCG/ACT AERO nasal inhaler Place 2 sprays into the nose daily. 03/16/23   Mechele Claude, MD  triamterene-hydrochlorothiazide [facility]) 37.5-25 MG tablet Take 1 tablet by mouth once daily 06/06/23   Mechele Claude, MD      Allergies    Patient has no known allergies.    Review of Systems   Review of Systems  Respiratory:  Positive for shortness of breath.     Physical Exam Updated Vital Signs BP (!) 187/88   Pulse 82   Temp 98.2 F (36.8 C) (Oral)   Resp 20   Ht 6\' 2"  (1.88 m)   Wt 111.6 kg   SpO2 93%   BMI 31.58 kg/m  Physical Exam Vitals and nursing note reviewed.  Constitutional:      General: He is not in acute distress.    Appearance: He is well-developed.  HENT:     Head: Normocephalic and atraumatic.  Eyes:     Extraocular Movements: Extraocular movements intact.     Conjunctiva/sclera: Conjunctivae normal.     Pupils: Pupils are equal, round, and reactive to light.  Cardiovascular:     Rate and Rhythm: Normal rate and regular rhythm.  Heart sounds: No murmur heard. Pulmonary:     Effort: Pulmonary effort is normal. No respiratory distress.     Breath sounds: Normal breath sounds.  Abdominal:     Palpations: Abdomen is soft.     Tenderness: There is no abdominal tenderness.  Musculoskeletal:        General: No swelling. Normal range of motion.     Cervical back: Neck supple.  Skin:    General: Skin is warm and dry.     Capillary Refill: Capillary refill takes less than 2 seconds.  Neurological:     General: No focal deficit present.     Mental Status: He is alert.  Psychiatric:        Mood and Affect: Mood normal.     ED Results / Procedures / Treatments   Labs (all labs ordered are listed, but only  abnormal results are displayed) Labs Reviewed  CBC WITH DIFFERENTIAL/PLATELET - Abnormal; Notable for the following components:      Result Value   WBC 13.4 (*)    Neutro Abs 10.1 (*)    Monocytes Absolute 1.4 (*)    Abs Immature Granulocytes 0.08 (*)    All other components within normal limits  BASIC METABOLIC PANEL  BRAIN NATRIURETIC PEPTIDE    EKG EKG Interpretation Date/Time:  "Sunday June 11 2023 22:04:43 EST Ventricular Rate:  111 PR Interval:  171 QRS Duration:  98 QT Interval:  343 QTC Calculation: 399 R Axis:   91  Text Interpretation: Sinus tachycardia Ventricular bigeminy Right axis deviation Probable anteroseptal infarct, old Confirmed by Miller, Brian ([id number]) on 06/11/2023 10:28:53 PM  Radiology DG Chest 2 View  Result Date: 06/11/2023 CLINICAL DATA:  Shortness of breath EXAM: CHEST - 2 VIEW COMPARISON:  03/16/2023 FINDINGS: The heart size and mediastinal contours are within normal limits. Both lungs are clear. The visualized skeletal structures are unremarkable. IMPRESSION: No active cardiopulmonary disease. Electronically Signed   By: Kevin  Dover M.D.   On: 06/11/2023 22:34    Procedures Procedures  {Document cardiac monitor, telemetry assessment procedure when appropriate:1}  Medications Ordered in ED Medications  albuterol (VENTOLIN HFA) 108 (90 Base) MCG/ACT inhaler 2 puff (has no administration in time range)    ED Course/ Medical Decision Making/ A&P   {   Click here for ABCD2, HEART and other calculatorsREFRESH Note before signing :1}                              Medical Decision Making Amount and/or Complexity of Data Reviewed Labs: ordered. Radiology: ordered.  Risk Prescription drug management.   ***  {Document critical care time when appropriate:1} {Document review of labs and clinical decision tools ie heart score, Chads2Vasc2 etc:1}  {Document your independent review of radiology images, and any outside records:1} {Document  your discussion with family members, caretakers, and with consultants:1} {Document social determinants of health affecting pt's care:1} {Document your decision making why or why not admission, treatments were needed:1} Final Clinical Impression(s) / ED Diagnoses Final diagnoses:  None    Rx / DC Orders ED Discharge Orders          Ordered    albuterol (VENTOLIN HFA) 108 (90 Base) MCG/ACT inhaler  Every 4 hours PRN        11" /17/24 2303

## 2023-06-11 NOTE — Discharge Instructions (Signed)
For ongoing cough we need to do the prednisone and the other medications and I have prescribed you an inhaler.  Follow-up with allergy and asthma specialist.  Come back to the ER for new or worsening symptoms.

## 2023-06-27 ENCOUNTER — Ambulatory Visit: Payer: Medicaid Other | Admitting: Family Medicine

## 2023-08-24 ENCOUNTER — Ambulatory Visit: Payer: Medicaid Other | Admitting: Family Medicine

## 2023-08-24 ENCOUNTER — Ambulatory Visit: Payer: Medicaid Other

## 2023-08-24 ENCOUNTER — Encounter: Payer: Self-pay | Admitting: Family Medicine

## 2023-08-24 VITALS — BP 150/80 | HR 47 | Ht 74.0 in | Wt 245.0 lb

## 2023-08-24 DIAGNOSIS — E1169 Type 2 diabetes mellitus with other specified complication: Secondary | ICD-10-CM

## 2023-08-24 DIAGNOSIS — I10 Essential (primary) hypertension: Secondary | ICD-10-CM | POA: Diagnosis not present

## 2023-08-24 DIAGNOSIS — E119 Type 2 diabetes mellitus without complications: Secondary | ICD-10-CM | POA: Diagnosis not present

## 2023-08-24 DIAGNOSIS — J329 Chronic sinusitis, unspecified: Secondary | ICD-10-CM

## 2023-08-24 LAB — BAYER DCA HB A1C WAIVED: HB A1C (BAYER DCA - WAIVED): 7.2 % — ABNORMAL HIGH (ref 4.8–5.6)

## 2023-08-24 MED ORDER — TRIAMCINOLONE ACETONIDE 55 MCG/ACT NA AERO: 2.0000 | INHALATION_SPRAY | Freq: Every day | NASAL | 11 refills | Status: AC

## 2023-08-24 MED ORDER — LORATADINE 10 MG PO TABS: 10.0000 mg | ORAL_TABLET | Freq: Every day | ORAL | 1 refills | Status: DC

## 2023-08-24 MED ORDER — DIPHENHYDRAMINE HCL 25 MG PO CAPS: 25.0000 mg | ORAL_CAPSULE | Freq: Every day | ORAL | 1 refills | Status: DC

## 2023-08-24 MED ORDER — TRIAMTERENE-HCTZ 75-50 MG PO TABS: 1.0000 | ORAL_TABLET | Freq: Every day | ORAL | 1 refills | Status: DC

## 2023-08-24 NOTE — Progress Notes (Signed)
BP (!) 150/80   Pulse (!) 47   Ht 6\' 2"  (1.88 m)   Wt 245 lb (111.1 kg)   BMI 31.46 kg/m    Subjective:   Patient ID: Bruce Fisher, male    DOB: April 25, 1961, 63 y.o.   MRN: [id number]  HPI: Bruce Fisher is a 63 y.o. male presenting on 08/24/2023 for Hypertension, Hyperglycemia, Sinus Problem, and Headache  Allergic rhinitis Patient has a history of chronic allergic rhinitis. He struggles with this year round but does have seasonal variations in his symptoms. He started noticing increased postnasal drainage, sneezing, and cough in October 2024. He was started on Singulair at that time. When his symptoms failed to improve with Singulair, he was prescribed a course of prednisone, moxifloxacin, and albuterol inhaler. He reports his symptoms drastically improve while on prednisone but return shortly after finishing it. He has tried over the counter allergy medicines without much benefit. He does use Nasacort as needed, which helps reduce the amount of nasal drainage. He has never seen ENT or Allergy/Immunology for this issue.  Hypertension Patient is currently taking amlodipine, benazepril, and triamterene-hydrochlorothiazide. His blood pressure today is 150/80. His blood pressure at home ranges between 138-159 systolic and 70-80 diastolic. His blood pressure was previously well-controlled, but he has noticed higher readings since finishing a course of prednisone one month ago. He has also been having daily headaches that somewhat improve with acetaminophen.  Type 2 Diabetes Patient is currently taking metformin 500 mg twice daily. His diabetes was previously well-controlled, but his blood glucose has been elevated since taking prednisone. He also noticed polyuria, worsening peripheral numbness/tingling, and vision changes while on the prednisone, but these symptoms have since resolved.  Relevant past medical, surgical, family and social history reviewed and updated as indicated. Interim  medical history since our last visit reviewed. Allergies and medications reviewed and updated.  Review of Systems  Constitutional:  Negative for chills and fever.  HENT:  Positive for congestion, postnasal drip, rhinorrhea and sneezing. Negative for ear pain, sore throat and trouble swallowing.   Eyes:  Negative for itching and visual disturbance.  Respiratory:  Positive for cough. Negative for chest tightness, shortness of breath and wheezing.   Cardiovascular:  Negative for chest pain, palpitations and leg swelling.  Gastrointestinal:  Negative for abdominal distention, abdominal pain, constipation and diarrhea.  Endocrine: Negative for polyuria.  Genitourinary:  Negative for difficulty urinating, dysuria, frequency and hematuria.  Musculoskeletal:  Negative for myalgias.  Skin:  Negative for rash and wound.  Allergic/Immunologic: Positive for environmental allergies.  Neurological:  Positive for numbness and headaches. Negative for dizziness, syncope, weakness and light-headedness.  Psychiatric/Behavioral:  Negative for sleep disturbance.     Per HPI unless specifically indicated above   Allergies as of 08/24/2023   No Known Allergies      Medication List        Accurate as of August 24, 2023  3:28 PM. If you have any questions, ask your nurse or doctor.          STOP taking these medications    predniSONE 20 MG tablet Commonly known as: DELTASONE Stopped by: Elige Radon Jaynee Winters   triamterene-hydrochlorothiazide 37.5-25 MG tablet Commonly known as: MAXZIDE-25 Replaced by: triamterene-hydrochlorothiazide 75-50 MG tablet Stopped by: Elige Radon Svea Pusch       TAKE these medications    albuterol 108 (90 Base) MCG/ACT inhaler Commonly known as: VENTOLIN HFA Inhale 2 puffs into the lungs every 4 (four) hours as needed for  wheezing or shortness of breath.   amLODipine 10 MG tablet Commonly known as: NORVASC Take 1 tablet (10 mg total) by mouth daily. for blood  pressure   benazepril 40 MG tablet Commonly known as: LOTENSIN Take 1 tablet (40 mg total) by mouth daily.   Co Q-10 100 MG Caps Take 100 mg by mouth daily.   diphenhydrAMINE 25 mg capsule Commonly known as: BENADRYL Take 1 capsule (25 mg total) by mouth at bedtime. Started by: Elige Radon Ellayna Hilligoss   loratadine 10 MG tablet Commonly known as: CLARITIN Take 1 tablet (10 mg total) by mouth daily. Started by: Elige Radon Oronde Hallenbeck   metFORMIN 500 MG tablet Commonly known as: GLUCOPHAGE Take 1 tablet (500 mg total) by mouth daily with breakfast.   montelukast 10 MG tablet Commonly known as: Singulair Take 1 tablet (10 mg total) by mouth at bedtime.   moxifloxacin 400 MG tablet Commonly known as: AVELOX Take 1 tablet (400 mg total) by mouth daily.   multivitamin with minerals Tabs tablet Take 1 tablet by mouth daily.   rosuvastatin 20 MG tablet Commonly known as: CRESTOR Take 1 tablet (20 mg total) by mouth daily. for cholesterol.   sildenafil 20 MG tablet Commonly known as: REVATIO TAKE 2 TO 5 TABLETS BY MOUTH AS NEEDED   triamcinolone 55 MCG/ACT Aero nasal inhaler Commonly known as: NASACORT Place 2 sprays into the nose at bedtime. What changed: when to take this Changed by: Elige Radon Kenon Delashmit   triamterene-hydrochlorothiazide 75-50 MG tablet Commonly known as: MAXZIDE Take 1 tablet by mouth daily. Replaces: triamterene-hydrochlorothiazide 37.5-25 MG tablet Started by: Elige Radon Elek Holderness   VITAMIN D PO Take 1 tablet by mouth daily.         Objective:   BP (!) 150/80   Pulse (!) 47   Ht 6\' 2"  (1.88 m)   Wt 245 lb (111.1 kg)   BMI 31.46 kg/m   Wt Readings from Last 3 Encounters:  08/24/23 245 lb (111.1 kg)  06/11/23 246 lb (111.6 kg)  06/06/23 246 lb 3.2 oz (111.7 kg)    Physical Exam Vitals and nursing note reviewed.  Constitutional:      Appearance: Normal appearance. He is obese.  HENT:     Head: Normocephalic and atraumatic.     Right Ear:  Tympanic membrane, ear canal and external ear normal.     Left Ear: Tympanic membrane, ear canal and external ear normal.     Nose: Mucosal edema and rhinorrhea present.     Right Turbinates: Swollen.     Left Turbinates: Swollen.     Mouth/Throat:     Mouth: Mucous membranes are moist.     Pharynx: Posterior oropharyngeal erythema and postnasal drip present. No oropharyngeal exudate.  Eyes:     Conjunctiva/sclera: Conjunctivae normal.  Cardiovascular:     Rate and Rhythm: Normal rate and regular rhythm.     Heart sounds: Normal heart sounds.  Pulmonary:     Effort: Pulmonary effort is normal.     Breath sounds: Normal breath sounds. No wheezing or rales.  Abdominal:     Tenderness: There is no right CVA tenderness or left CVA tenderness.  Musculoskeletal:     Cervical back: Normal range of motion and neck supple. No tenderness.     Right lower leg: No edema.     Left lower leg: No edema.  Skin:    General: Skin is warm and dry.  Neurological:     Mental Status: He is  alert and oriented to person, place, and time.  Psychiatric:        Mood and Affect: Mood normal.        Behavior: Behavior normal.        Thought Content: Thought content normal.        Judgment: Judgment normal.     Assessment & Plan:   Problem List Items Addressed This Visit       Cardiovascular and Mediastinum   Essential hypertension - Primary   Relevant Medications   triamterene-hydrochlorothiazide (MAXZIDE) 75-50 MG tablet   Other Relevant Orders   CMP14+EGFR     Endocrine   Type 2 diabetes mellitus without complication, without long-term current use of insulin (HCC)   Relevant Orders   Bayer DCA Hb A1c Waived   Other Visit Diagnoses       Chronic congestion of paranasal sinus       Relevant Medications   loratadine (CLARITIN) 10 MG tablet   diphenhydrAMINE (BENADRYL) 25 mg capsule   triamcinolone (NASACORT) 55 MCG/ACT AERO nasal inhaler       For the allergic rhinitis, will start  loratadine 10 mg every morning and diphenhydramine 25 mg every evening. Will also schedule Nasacort every evening. Discussed with patient importance of taking these medications every day to better manage inflammation from allergies. Can consider referral to ENT or Allergy/Immunology for evaluation in the future if his symptoms fail to improve with these medications. Will also increase triamterene-hydrochlorothiazide to 75-50mg  daily if CMP stable. Encouraged patient to continue monitoring blood pressure at home with this dose adjustment. Will check HgbA1c today and adjust diabetes medications as needed.  Follow up plan: Return if symptoms worsen or fail to improve.  Counseling provided for all of the vaccine components Orders Placed This Encounter  Procedures   Bayer [facility] Hb A1c Waived   CMP14+EGFR    Gillermina Phy, Medical Student [facility] 08/24/2023, 3:28 PM  I was personally present for all components of the history, physical exam and/or medical decision making.  I agree with the documentation performed by the student and agree with assessment and plan above.  Arville Care, MD [facility] 08/25/2023, 12:40 PM

## 2023-08-25 ENCOUNTER — Ambulatory Visit: Payer: Self-pay | Admitting: Family Medicine

## 2023-08-25 ENCOUNTER — Encounter: Payer: Self-pay | Admitting: Family Medicine

## 2023-08-25 ENCOUNTER — Telehealth: Payer: Self-pay | Admitting: Family Medicine

## 2023-08-25 LAB — CMP14+EGFR
ALT: 22 [IU]/L (ref 0–44)
AST: 19 [IU]/L (ref 0–40)
Albumin: 4.9 g/dL (ref 3.8–4.9)
Alkaline Phosphatase: 63 [IU]/L (ref 44–121)
BUN/Creatinine Ratio: 14 (ref 10–24)
BUN: 13 mg/dL (ref 8–27)
Bilirubin Total: 0.3 mg/dL (ref 0.0–1.2)
CO2: 23 mmol/L (ref 20–29)
Calcium: 10.1 mg/dL (ref 8.6–10.2)
Chloride: 104 mmol/L (ref 96–106)
Creatinine, Ser: 0.92 mg/dL (ref 0.76–1.27)
Globulin, Total: 3.2 g/dL (ref 1.5–4.5)
Glucose: 86 mg/dL (ref 70–99)
Potassium: 4.2 mmol/L (ref 3.5–5.2)
Sodium: 143 mmol/L (ref 134–144)
Total Protein: 8.1 g/dL (ref 6.0–8.5)
eGFR: 95 mL/min/{1.73_m2} (ref >59)

## 2023-08-25 NOTE — Telephone Encounter (Signed)
Copied from CRM [id number]. Topic: Clinical - Red Word Triage >> Aug 25, 2023  9:10 AM Geroge Baseman wrote: Red Word that prompted transfer to Nurse Triage: Severe headache wanting to get medication because tylenol is not working. States he is needing some advice asap  Chief Complaint: Patient  reports she was seen in he office on yesterday due to Severe headache.  Patient states doctor provided him a new blood pressure medication but he could not take it until this morning. Patient states he took it this morning at 0600 am and have not received any relief. Severe headache wanting to get medication because tylenol is not working.  Symptoms: Sever headache - rates pain as 10 and the worst headache of his life.  Frequency: x 2 days Pertinent Negatives: Patient denies any other symptoms .Denies ever being diagnosed with Migraine. Disposition: [x] ED /[] Urgent Care (no appt availability in office) / [] Appointment(In office/virtual)/ []  [place] [facility]/ [] Home Care/ [] Refused Recommended Disposition /[] [place] [facility]/ []  Follow-up with PCP Additional Notes:   ED Now - Patient would like to see if his doctor can provide him something rather than going to the ER. Reason for Disposition  [1] SEVERE headache (e.g., excruciating) AND [2] "worst headache" of life  Reason for Disposition  [1] SEVERE headache (e.g., excruciating) AND [2] "worst headache" of life  Answer Assessment - Initial Assessment Questions 1. LOCATION: "Where does it hurt?"       neck to the back of my head 2. ONSET: "When did the headache start?" (Minutes, hours or days)        Headache started on yesterday. 3. PATTERN: "Does the pain come and go, or has it been constant since it started?"      Constant 4. SEVERITY: "How bad is the pain?" and "What does it keep you from doing?"  (e.g., Scale 1-10; mild, moderate, or severe)   -    - SEVERE (8-10): excruciating pain, unable to do any normal activities         10 5.  RECURRENT SYMPTOM: "Have you ever had headaches before?" If Yes, ask: "When was the last time?" and "What happened that time?"       Absolutely, headaches 6. CAUSE: "What do you think is causing the headache?"      7. MIGRAINE: "Have you been diagnosed with migraine headaches?" If Yes, ask: "Is this headache similar?"      *No Answer* 8. HEAD INJURY: "Has there been any recent injury to the head?"       9. OTHER SYMPTOMS: "Do you have any other symptoms?" (fever, stiff neck, eye pain, sore throat, cold symptoms)     *No Answer* 10. PREGNANCY: "Is there any chance you are pregnant?" "When was your last menstrual period?"       *No Answer*  Protocols used: Headache-A-AH

## 2023-08-25 NOTE — Telephone Encounter (Signed)
 Please review labs.

## 2023-08-25 NOTE — Telephone Encounter (Unsigned)
Copied from CRM [id number]. Topic: Clinical - Lab/Test Results >> Aug 25, 2023  9:13 AM Bruce Fisher wrote: Reason for CRM: Patient would like someone to give him a call back in regard to his recent lab results he is curious about his A1C and would like to speak with someone in the office for an update as soon as someone can call him

## 2023-08-25 NOTE — Telephone Encounter (Signed)
Left message for pt to return call. Advised pt to go to urgent care or ER is severe enough since it is so late in the day. Otherwise call the office on Monday to be worked in

## 2023-08-25 NOTE — Telephone Encounter (Signed)
Please let him know that I am more than happy for him to come in and get a Toradol shot which would help with his headaches, if he is willing to do that then we can just put it on the triage nurse schedule.  Will give him 60 mg Toradol 1 time

## 2023-08-28 NOTE — Telephone Encounter (Signed)
Spoke to pt and he said that his headache has gotten somewhat better. I asked if he would like to make another appointment to follow up and he declined. Pt informed of labs and recommendations.

## 2023-09-07 ENCOUNTER — Encounter: Payer: Self-pay | Admitting: Family Medicine

## 2023-09-07 ENCOUNTER — Ambulatory Visit (INDEPENDENT_AMBULATORY_CARE_PROVIDER_SITE_OTHER): Payer: Medicaid Other | Admitting: Family Medicine

## 2023-09-07 VITALS — BP 120/82 | HR 75 | Temp 97.3°F | Ht 74.0 in | Wt 246.0 lb

## 2023-09-07 DIAGNOSIS — J3089 Other allergic rhinitis: Secondary | ICD-10-CM

## 2023-09-07 MED ORDER — VALSARTAN-HYDROCHLOROTHIAZIDE 160-25 MG PO TABS: 1.0000 | ORAL_TABLET | Freq: Every day | ORAL | 3 refills | Status: DC

## 2023-09-07 NOTE — Progress Notes (Signed)
 Subjective:  Patient ID: Bruce Fisher, male    DOB: 1960/10/20  Age: 63 y.o. MRN: [id number]  CC: Cough (Still has cough from previous apt. Occasional productive but clear. Causes SOB. Would like to see a specialist. ) and Medication Problem (Would like to discuss meds)   HPI Bruce Fisher presents for ongoing cough since last August. Requesting referral to allergist. Declines referral to ENT. He continues to use Ace for BP treatent. Multiple treatments have not been effective over several months.      08/24/2023    1:51 PM 06/06/2023   10:01 AM 05/18/2023   10:31 AM  Depression screen PHQ 2/9  Decreased Interest 0 0 0  Down, Depressed, Hopeless 0 0 0  PHQ - 2 Score 0 0 0  Altered sleeping   0  Tired, decreased energy   0  Change in appetite   0  Feeling bad or failure about yourself    0  Trouble concentrating   0  Moving slowly or fidgety/restless   0  Suicidal thoughts   0  PHQ-9 Score   0  Difficult doing work/chores   Not difficult at all    History Bruce Fisher has a past medical history of Hyperlipidemia, Hypertension, Kidney stones, Rectal spasm, and Renal disorder.   He has a past surgical history that includes Spine surgery.   His family history includes Cancer in his father and sister; Diabetes in his mother; Heart disease in his sister; Heart failure in his mother.He reports that he quit smoking about 30 years ago. His smoking use included cigarettes. He has never used smokeless tobacco. He reports that he does not drink alcohol and does not use drugs.    ROS Review of Systems  Constitutional:  Negative for fever.  Respiratory:  Negative for shortness of breath.   Cardiovascular:  Negative for chest pain.  Musculoskeletal:  Negative for arthralgias.  Skin:  Negative for rash.    Objective:  BP 120/82   Pulse 75   Temp (!) 97.3 F (36.3 C)   Ht 6\' 2"  (1.88 m)   Wt 246 lb (111.6 kg)   SpO2 94%   BMI 31.58 kg/m   BP Readings from Last 3 Encounters:   09/07/23 120/82  08/24/23 (!) 150/80  06/11/23 (!) 187/88    Wt Readings from Last 3 Encounters:  09/07/23 246 lb (111.6 kg)  08/24/23 245 lb (111.1 kg)  06/11/23 246 lb (111.6 kg)     Physical Exam Vitals reviewed.  Constitutional:      Appearance: He is well-developed.  HENT:     Head: Normocephalic and atraumatic.     Right Ear: External ear normal.     Left Ear: External ear normal.     Mouth/Throat:     Pharynx: No oropharyngeal exudate or posterior oropharyngeal erythema.  Eyes:     Pupils: Pupils are equal, round, and reactive to light.  Cardiovascular:     Rate and Rhythm: Normal rate and regular rhythm.     Heart sounds: No murmur heard. Pulmonary:     Effort: No respiratory distress.     Breath sounds: Normal breath sounds.  Musculoskeletal:     Cervical back: Normal range of motion and neck supple.  Neurological:     Mental Status: He is alert and oriented to person, place, and time.       Assessment & Plan:   Bruce Fisher was seen today for cough and medication problem.  Diagnoses and all orders for  this visit:  Perennial allergic rhinitis -     Ambulatory referral to Allergy  Other orders -     valsartan-hydrochlorothiazide (DIOVAN-HCT) 160-25 MG tablet; Take 1 tablet by mouth daily.       I have discontinued Bruce Fisher's benazepril, moxifloxacin, and triamterene-hydrochlorothiazide. I am also having him start on valsartan-hydrochlorothiazide. Additionally, I am having him maintain his multivitamin with minerals, Cholecalciferol (VITAMIN D PO), Co Q-10, amLODipine, rosuvastatin, sildenafil, metFORMIN, montelukast, albuterol, loratadine, diphenhydrAMINE, and triamcinolone.  Allergies as of 09/07/2023   No Known Allergies      Medication List        Accurate as of September 07, 2023 11:59 PM. If you have any questions, ask your nurse or doctor.          STOP taking these medications    benazepril 40 MG tablet Commonly known as:  LOTENSIN Stopped by: Bruce Fisher   moxifloxacin 400 MG tablet Commonly known as: AVELOX Stopped by: Bruce Fisher   triamterene-hydrochlorothiazide 75-50 MG tablet Commonly known as: MAXZIDE Stopped by: Bruce Fisher       TAKE these medications    albuterol 108 (90 Base) MCG/ACT inhaler Commonly known as: VENTOLIN HFA Inhale 2 puffs into the lungs every 4 (four) hours as needed for wheezing or shortness of breath.   amLODipine 10 MG tablet Commonly known as: NORVASC Take 1 tablet (10 mg total) by mouth daily. for blood pressure   Co Q-10 100 MG Caps Take 100 mg by mouth daily.   diphenhydrAMINE 25 mg capsule Commonly known as: BENADRYL Take 1 capsule (25 mg total) by mouth at bedtime.   loratadine 10 MG tablet Commonly known as: CLARITIN Take 1 tablet (10 mg total) by mouth daily.   metFORMIN 500 MG tablet Commonly known as: GLUCOPHAGE Take 1 tablet (500 mg total) by mouth daily with breakfast.   montelukast 10 MG tablet Commonly known as: Singulair Take 1 tablet (10 mg total) by mouth at bedtime.   multivitamin with minerals Tabs tablet Take 1 tablet by mouth daily.   rosuvastatin 20 MG tablet Commonly known as: CRESTOR Take 1 tablet (20 mg total) by mouth daily. for cholesterol.   sildenafil 20 MG tablet Commonly known as: REVATIO TAKE 2 TO 5 TABLETS BY MOUTH AS NEEDED   triamcinolone 55 MCG/ACT Aero nasal inhaler Commonly known as: NASACORT Place 2 sprays into the nose at bedtime.   valsartan-hydrochlorothiazide 160-25 MG tablet Commonly known as: DIOVAN-HCT Take 1 tablet by mouth daily. Started by: Braxson Hollingsworth   VITAMIN D PO Take 1 tablet by mouth daily.         Follow-up: Return if symptoms worsen or fail to improve.  Mechele Claude, M.D.

## 2023-09-07 NOTE — Patient Instructions (Signed)
Discontinue benazepril and triamterene HCTZ

## 2023-09-11 ENCOUNTER — Encounter: Payer: Self-pay | Admitting: Family Medicine

## 2023-09-15 ENCOUNTER — Telehealth: Payer: Self-pay | Admitting: Family Medicine

## 2023-09-15 NOTE — Telephone Encounter (Signed)
 Copied from CRM [id number]. Topic: Clinical - Medication Question >> Sep 15, 2023  9:40 AM Fuller Mandril wrote: Reason for CRM: Patient called states last visit provider change his blood pressure medication and since he has been taking the medication when he eats his legs swell some from calf to ankle. Asked to connect to Triage. Patient states he did not want to speak to nurse triage. Spoke with them last time and they were not able to assist. Would just like to get the message to his provider to see what he suggests. Thank You

## 2023-09-17 NOTE — Telephone Encounter (Signed)
 Try adding back 1/2 of a pill of the triamterene/HCTZ once every morning

## 2023-09-18 NOTE — Telephone Encounter (Signed)
 Lm on vm on 2/24 LS

## 2023-09-19 ENCOUNTER — Ambulatory Visit: Payer: Self-pay | Admitting: Family Medicine

## 2023-09-19 NOTE — Telephone Encounter (Signed)
 Copied from CRM [id number]. Topic: Clinical - Red Word Triage >> Sep 19, 2023  4:37 PM Higinio Roger wrote: Patient states his legs are swelling. Blood pressure meds is making his heart beat faster. Medication: valsartan-hydrochlorothiazide (DIOVAN-HCT) 160-25 MG tablet   Chief Complaint: Racing heart Symptoms: Palpitations  Frequency: Constant  Pertinent Negatives: Patient denies chest pain, shortness of breath Disposition: [] ED /[] Urgent Care (no appt availability in office) / [x] Appointment(In office/virtual)/ []  [facility]/ [] Home Care/ [] Refused Recommended Disposition /[] [facility]/ []  Follow-up with PCP Additional Notes: Patient reports that since he started a new blood pressure medication last week he has had feelings of racing heartbeats. He states that the feeling is constant and is a side effect of the medication. Patient would like to have something done about this. Appointment made for the patient tomorrow with the doctor of the day.     Reason for Disposition  [1] Palpitations AND [2] no improvement after using Care Advice  Answer Assessment - Initial Assessment Questions 1. DESCRIPTION: "Please describe your heart rate or heartbeat that you are having" (e.g., fast/slow, regular/irregular, skipped or extra beats, "palpitations")     Feels like heart is racing  2. ONSET: "When did it start?" (Minutes, hours or days)      4-5 days ago  3. DURATION: "How long does it last" (e.g., seconds, minutes, hours)     Constant  4. PATTERN "Does it come and go, or has it been constant since it started?"  "Does it get worse with exertion?"   "Are you feeling it now?"     No 6. HEART RATE: "Can you tell me your heart rate?" "How many beats in 15 seconds?"  (Note: not all patients can do this)       Has not checked heart rate 7. RECURRENT SYMPTOM: "Have you ever had this before?" If Yes, ask: "When was the last time?" and "What happened that time?"      New with changes  in medication  8. CAUSE: "What do you think is causing the palpitations?"     Believes it to be blood pressure medication  9. CARDIAC HISTORY: "Do you have any history of heart disease?" (e.g., heart attack, angina, bypass surgery, angioplasty, arrhythmia)      PVC's,  10. OTHER SYMPTOMS: "Do you have any other symptoms?" (e.g., dizziness, chest pain, sweating, difficulty breathing)       No  Protocols used: Heart Rate and Heartbeat Questions-A-AH

## 2023-09-19 NOTE — Telephone Encounter (Signed)
 Lm on vm and patient message sent through MyChart. LS

## 2023-09-20 ENCOUNTER — Ambulatory Visit: Payer: Medicaid Other | Admitting: Family Medicine

## 2023-09-20 ENCOUNTER — Encounter: Payer: Self-pay | Admitting: Family Medicine

## 2023-09-20 VITALS — BP 114/80 | HR 80 | Temp 98.2°F | Ht 74.0 in | Wt 248.0 lb

## 2023-09-20 DIAGNOSIS — I499 Cardiac arrhythmia, unspecified: Secondary | ICD-10-CM | POA: Diagnosis not present

## 2023-09-20 DIAGNOSIS — R062 Wheezing: Secondary | ICD-10-CM | POA: Diagnosis not present

## 2023-09-20 DIAGNOSIS — I10 Essential (primary) hypertension: Secondary | ICD-10-CM

## 2023-09-20 NOTE — Progress Notes (Signed)
 Subjective:  Patient ID: Bruce Fisher, male    DOB: 07/11/1961, 62 y.o.   MRN: [id number]  Patient Care Team: Mechele Claude, MD as PCP - General (Family Medicine)   Chief Complaint:  Tachycardia  HPI: Bruce Fisher is a 63 y.o. male presenting on 09/20/2023 for Tachycardia Patient reports that he is being treated by PCP for continued PND. Reports that he has tried multiple medications and has only responded to prednisone. States that prednisone "messed up" his BP and BG. He followed up with [facility] at [facility] and his antihypertensives were then increased to treat BP elevation. Then came back to see [facility] and was referred to allergist and was told to stop ACEI to see if cough improved. He was switched to valsartan-hydrochlorothiazide and told to stop triamterene-hydrochlorothiazide. After stopping triamterene-hydrochlorothiazide, he noticed increased lower leg edema. He was then restarted on a half dose. Currently taking 37.5 -25mg  for  the past two days. Since starting valsartan-hydrochlorothiazide notices his HR increases and races intermittently. Checks with BP monitor and HR 137-139 during events. States that it is not currently happening. Happens while sitting, may last for 2-3 hours. Denies SOB, chest pain, dizziness, diaphoresis with elevated HR. BP at home last night 110/80. Reports that he has been "checked out twice and does not have a problem with lungs"  Relevant past medical, surgical, family, and social history reviewed and updated as indicated.  Allergies and medications reviewed and updated. Data reviewed: Chart in Epic.  Past Medical History:  Diagnosis Date   Hyperlipidemia    Hypertension    Kidney stones    Rectal spasm    Renal disorder    kidney stones   Past Surgical History:  Procedure Laterality Date   SPINE SURGERY     lower back   Social History   Socioeconomic History   Marital status: Married    Spouse name: Not on file   Number of children: Not  on file   Years of education: Not on file   Highest education level: Not on file  Occupational History   Not on file  Tobacco Use   Smoking status: Former    Current packs/day: 0.00    Types: Cigarettes    Quit date: 08/19/1993    Years since quitting: 30.1   Smokeless tobacco: Never  Vaping Use   Vaping status: Never Used  Substance and Sexual Activity   Alcohol use: No    Alcohol/week: 0.0 standard drinks of alcohol   Drug use: No   Sexual activity: Yes  Other Topics Concern   Not on file  Social History Narrative   Married. Lives with wife.     Social Drivers of Corporate investment banker Strain: Not on file  Food Insecurity: Not on file  Transportation Needs: Not on file  Physical Activity: Not on file  Stress: Not on file  Social Connections: Not on file  Intimate Partner Violence: Not on file    Outpatient Encounter Medications as of 09/20/2023  Medication Sig   albuterol (VENTOLIN HFA) 108 (90 Base) MCG/ACT inhaler Inhale 2 puffs into the lungs every 4 (four) hours as needed for wheezing or shortness of breath.   amLODipine (NORVASC) 10 MG tablet Take 1 tablet (10 mg total) by mouth daily. for blood pressure   Cholecalciferol (VITAMIN D PO) Take 1 tablet by mouth daily.   Coenzyme Q10 (CO Q-10) 100 MG CAPS Take 100 mg by mouth daily.   diphenhydrAMINE (BENADRYL) 25 mg  capsule Take 1 capsule (25 mg total) by mouth at bedtime.   loratadine (CLARITIN) 10 MG tablet Take 1 tablet (10 mg total) by mouth daily.   metFORMIN (GLUCOPHAGE) 500 MG tablet Take 1 tablet (500 mg total) by mouth daily with breakfast.   montelukast (SINGULAIR) 10 MG tablet Take 1 tablet (10 mg total) by mouth at bedtime.   Multiple Vitamin (MULTIVITAMIN WITH MINERALS) TABS tablet Take 1 tablet by mouth daily.   rosuvastatin (CRESTOR) 20 MG tablet Take 1 tablet (20 mg total) by mouth daily. for cholesterol.   sildenafil (REVATIO) 20 MG tablet TAKE 2 TO 5 TABLETS BY MOUTH AS NEEDED   triamcinolone  (NASACORT) 55 MCG/ACT AERO nasal inhaler Place 2 sprays into the nose at bedtime.   valsartan-hydrochlorothiazide (DIOVAN-HCT) 160-25 MG tablet Take 1 tablet by mouth daily.   triamterene-hydrochlorothiazide (MAXZIDE-25) 37.5-25 MG tablet Take 1 tablet by mouth daily. (Patient not taking: Reported on 09/20/2023)   No facility-administered encounter medications on file as of 09/20/2023.    No Known Allergies  Review of Systems As per HPI  Objective:  BP 114/80   Pulse 80   Temp 98.2 F (36.8 C)   Ht 6\' 2"  (1.88 m)   Wt 248 lb (112.5 kg)   SpO2 90%   BMI 31.84 kg/m    Wt Readings from Last 3 Encounters:  09/20/23 248 lb (112.5 kg)  09/07/23 246 lb (111.6 kg)  08/24/23 245 lb (111.1 kg)   Physical Exam Constitutional:      General: He is awake. He is not in acute distress.    Appearance: Normal appearance. He is well-developed and well-groomed. He is not ill-appearing, toxic-appearing or diaphoretic.  Cardiovascular:     Rate and Rhythm: Normal rate. Rhythm irregular.     Pulses: Normal pulses.          Radial pulses are 2+ on the right side and 2+ on the left side.       Posterior tibial pulses are 2+ on the right side and 2+ on the left side.     Heart sounds: Normal heart sounds. No murmur heard.    No gallop.  Pulmonary:     Effort: Pulmonary effort is normal. No respiratory distress.     Breath sounds: No stridor. Examination of the right-upper field reveals wheezing. Examination of the left-upper field reveals wheezing. Examination of the right-middle field reveals wheezing. Examination of the left-middle field reveals wheezing. Examination of the right-lower field reveals wheezing. Examination of the left-lower field reveals wheezing. Wheezing present. No rhonchi or rales.  Musculoskeletal:     Cervical back: Full passive range of motion without pain and neck supple.     Right lower leg: No edema.     Left lower leg: No edema.  Skin:    General: Skin is warm.      Capillary Refill: Capillary refill takes less than 2 seconds.  Neurological:     General: No focal deficit present.     Mental Status: He is alert, oriented to person, place, and time and easily aroused. Mental status is at baseline.     GCS: GCS eye subscore is 4. GCS verbal subscore is 5. GCS motor subscore is 6.     Motor: No weakness.  Psychiatric:        Attention and Perception: Attention and perception normal.        Mood and Affect: Mood and affect normal.        Speech: Speech normal.  Behavior: Behavior normal. Behavior is cooperative.        Thought Content: Thought content normal. Thought content does not include homicidal or suicidal ideation. Thought content does not include homicidal or suicidal plan.        Cognition and Memory: Cognition and memory normal.        Judgment: Judgment normal.    Results for orders placed or performed in visit on 08/24/23  Bayer DCA Hb A1c Waived   Collection Time: 08/24/23  2:55 PM  Result Value Ref Range   HB A1C (BAYER DCA - WAIVED) 7.2 (H) 4.8 - 5.6 %  CMP14+EGFR   Collection Time: 08/24/23  2:56 PM  Result Value Ref Range   Glucose 86 70 - 99 mg/dL   BUN 13 8 - 27 mg/dL   Creatinine, Ser 1.61 0.76 - 1.27 mg/dL   eGFR 95 >09 UE/AVW/0.98   BUN/Creatinine Ratio 14 10 - 24   Sodium 143 134 - 144 mmol/L   Potassium 4.2 3.5 - 5.2 mmol/L   Chloride 104 96 - 106 mmol/L   CO2 23 20 - 29 mmol/L   Calcium 10.1 8.6 - 10.2 mg/dL   Total Protein 8.1 6.0 - 8.5 g/dL   Albumin 4.9 3.8 - 4.9 g/dL   Globulin, Total 3.2 1.5 - 4.5 g/dL   Bilirubin Total 0.3 0.0 - 1.2 mg/dL   Alkaline Phosphatase 63 44 - 121 IU/L   AST 19 0 - 40 IU/L   ALT 22 0 - 44 IU/L       09/20/2023    8:34 AM 08/24/2023    1:51 PM 06/06/2023   10:01 AM 05/18/2023   10:31 AM 03/16/2023   12:55 PM  Depression screen PHQ 2/9  Decreased Interest 0 0 0 0 0  Down, Depressed, Hopeless 0 0 0 0 0  PHQ - 2 Score 0 0 0 0 0  Altered sleeping    0 0  Tired, decreased  energy    0 0  Change in appetite    0 0  Feeling bad or failure about yourself     0 0  Trouble concentrating    0 0  Moving slowly or fidgety/restless    0 0  Suicidal thoughts    0 0  PHQ-9 Score    0 0  Difficult doing work/chores    Not difficult at all       09/20/2023    8:34 AM 05/18/2023   10:31 AM 03/16/2023   12:56 PM  GAD 7 : Generalized Anxiety Score  Nervous, Anxious, on Edge 0 0 0  Control/stop worrying 0 0 0  Worry too much - different things 0 0 0  Trouble relaxing 0 0 0  Restless 0 0 0  Easily annoyed or irritable 0 0 0  Afraid - awful might happen 0 0 0  Total GAD 7 Score 0 0 0  Anxiety Difficulty Not difficult at all Not difficult at all    Pertinent labs & imaging results that were available during my care of the patient were reviewed by me and considered in my medical decision making.  Assessment & Plan:  Dontrel was seen today for tachycardia.  Diagnoses and all orders for this visit:  Irregular heart rhythm Patient has history of ventricular bigeminy. Discussed different causes of tachycardia. Recommend that patient continue current medications, will check labs as below. Recommended EKG and Zio, patient declined.  -     CBC with Differential/Platelet -  CMP14+EGFR  Essential hypertension Well controlled. As above. Will continue current regimen and monitor labs to determine next steps.  -     CBC with Differential/Platelet -     CMP14+EGFR  Wheeze Reviewed imaging from 06/11/2023. Reviewed notes from Hyacinth Meeker, MD 06/11/23.  Patient to follow up with asthma and allergy as planned with PCP.   DG Chest 2 View (Order #[id number]) on 06/11/2023 - Order Result History Report  IMPRESSION: No active cardiopulmonary disease.    Electronically Signed   By: Charlett Nose M.D.   On: 06/11/2023 22:34 Continue all other maintenance medications.  Follow up plan: Return if symptoms worsen or fail to improve.  Continue healthy lifestyle choices, including  diet (rich in fruits, vegetables, and lean proteins, and low in salt and simple carbohydrates) and exercise (at least 30 minutes of moderate physical activity daily).  Written and verbal instructions provided   The above assessment and management plan was discussed with the patient. The patient verbalized understanding of and has agreed to the management plan. Patient is aware to call the clinic if they develop any new symptoms or if symptoms persist or worsen. Patient is aware when to return to the clinic for a follow-up visit. Patient educated on when it is appropriate to go to the emergency department.   Neale Burly, DNP-FNP [facility] [address] [place], [place] [address] [phone]

## 2023-09-20 NOTE — Telephone Encounter (Signed)
 Appt today

## 2023-09-21 ENCOUNTER — Emergency Department (HOSPITAL_COMMUNITY): Payer: Medicaid Other

## 2023-09-21 ENCOUNTER — Inpatient Hospital Stay (HOSPITAL_COMMUNITY)
Admission: EM | Admit: 2023-09-21 | Discharge: 2023-09-24 | DRG: 193 | Disposition: A | Discharge status: Home / Self Care | Payer: Medicaid Other | Attending: Family Medicine | Admitting: Family Medicine

## 2023-09-21 ENCOUNTER — Other Ambulatory Visit: Payer: Self-pay

## 2023-09-21 DIAGNOSIS — Y92239 Unspecified place in hospital as the place of occurrence of the external cause: Secondary | ICD-10-CM | POA: Diagnosis not present

## 2023-09-21 DIAGNOSIS — J9601 Acute respiratory failure with hypoxia: Secondary | ICD-10-CM | POA: Diagnosis not present

## 2023-09-21 DIAGNOSIS — Z87898 Personal history of other specified conditions: Secondary | ICD-10-CM

## 2023-09-21 DIAGNOSIS — R000 Tachycardia, unspecified: Secondary | ICD-10-CM | POA: Diagnosis present

## 2023-09-21 DIAGNOSIS — J019 Acute sinusitis, unspecified: Secondary | ICD-10-CM | POA: Diagnosis present

## 2023-09-21 DIAGNOSIS — I10 Essential (primary) hypertension: Secondary | ICD-10-CM | POA: Diagnosis not present

## 2023-09-21 DIAGNOSIS — E1165 Type 2 diabetes mellitus with hyperglycemia: Secondary | ICD-10-CM | POA: Diagnosis present

## 2023-09-21 DIAGNOSIS — J101 Influenza due to other identified influenza virus with other respiratory manifestations: Principal | ICD-10-CM | POA: Diagnosis present

## 2023-09-21 DIAGNOSIS — R918 Other nonspecific abnormal finding of lung field: Secondary | ICD-10-CM | POA: Diagnosis not present

## 2023-09-21 DIAGNOSIS — Z79899 Other long term (current) drug therapy: Secondary | ICD-10-CM

## 2023-09-21 DIAGNOSIS — Z87891 Personal history of nicotine dependence: Secondary | ICD-10-CM

## 2023-09-21 DIAGNOSIS — R0602 Shortness of breath: Secondary | ICD-10-CM | POA: Diagnosis not present

## 2023-09-21 DIAGNOSIS — R059 Cough, unspecified: Secondary | ICD-10-CM | POA: Diagnosis not present

## 2023-09-21 DIAGNOSIS — Z8249 Family history of ischemic heart disease and other diseases of the circulatory system: Secondary | ICD-10-CM

## 2023-09-21 DIAGNOSIS — I493 Ventricular premature depolarization: Secondary | ICD-10-CM | POA: Diagnosis present

## 2023-09-21 DIAGNOSIS — J324 Chronic pansinusitis: Secondary | ICD-10-CM | POA: Diagnosis present

## 2023-09-21 DIAGNOSIS — J329 Chronic sinusitis, unspecified: Secondary | ICD-10-CM | POA: Diagnosis present

## 2023-09-21 DIAGNOSIS — E782 Mixed hyperlipidemia: Secondary | ICD-10-CM | POA: Diagnosis not present

## 2023-09-21 DIAGNOSIS — Z1152 Encounter for screening for COVID-19: Secondary | ICD-10-CM

## 2023-09-21 DIAGNOSIS — T380X5A Adverse effect of glucocorticoids and synthetic analogues, initial encounter: Secondary | ICD-10-CM | POA: Diagnosis not present

## 2023-09-21 DIAGNOSIS — R9431 Abnormal electrocardiogram [ECG] [EKG]: Secondary | ICD-10-CM | POA: Diagnosis present

## 2023-09-21 DIAGNOSIS — J441 Chronic obstructive pulmonary disease with (acute) exacerbation: Principal | ICD-10-CM | POA: Diagnosis present

## 2023-09-21 DIAGNOSIS — Z7984 Long term (current) use of oral hypoglycemic drugs: Secondary | ICD-10-CM

## 2023-09-21 DIAGNOSIS — Z833 Family history of diabetes mellitus: Secondary | ICD-10-CM

## 2023-09-21 DIAGNOSIS — Z87442 Personal history of urinary calculi: Secondary | ICD-10-CM

## 2023-09-21 HISTORY — DX: Chronic obstructive pulmonary disease, unspecified: J44.9

## 2023-09-21 HISTORY — DX: Type 2 diabetes mellitus without complications: E11.9

## 2023-09-21 LAB — BASIC METABOLIC PANEL
Anion gap: 11 (ref 5–15)
BUN: 20 mg/dL (ref 6–20)
CO2: 24 mmol/L (ref 22–32)
Calcium: 9.1 mg/dL (ref 8.9–10.3)
Chloride: 102 mmol/L (ref 98–111)
Creatinine, Ser: 1.15 mg/dL (ref 0.61–1.24)
GFR, Estimated: >60 mL/min (ref >60)
Glucose, Bld: 151 mg/dL — ABNORMAL HIGH (ref 70–99)
Potassium: 3.5 mmol/L (ref 3.5–5.1)
Sodium: 137 mmol/L (ref 135–145)

## 2023-09-21 LAB — CBC
HCT: 47.5 % (ref 39.0–52.0)
Hemoglobin: 15.7 g/dL (ref 13.0–17.0)
MCH: 29.3 pg (ref 26.0–34.0)
MCHC: 33.1 g/dL (ref 30.0–36.0)
MCV: 88.6 fL (ref 80.0–100.0)
Platelets: 193 10*3/uL (ref 150–400)
RBC: 5.36 MIL/uL (ref 4.22–5.81)
RDW: 14 % (ref 11.5–15.5)
WBC: 9.5 10*3/uL (ref 4.0–10.5)
nRBC: 0 % (ref 0.0–0.2)

## 2023-09-21 LAB — BRAIN NATRIURETIC PEPTIDE: B Natriuretic Peptide: 76 pg/mL (ref 0.0–100.0)

## 2023-09-21 LAB — D-DIMER, QUANTITATIVE: D-Dimer, Quant: 0.49 ug{FEU}/mL (ref 0.00–0.50)

## 2023-09-21 LAB — TROPONIN I (HIGH SENSITIVITY): Troponin I (High Sensitivity): 2 ng/L (ref <18)

## 2023-09-21 MED ORDER — METHYLPREDNISOLONE SODIUM SUCC 125 MG IJ SOLR
125.0000 mg | Freq: Once | INTRAMUSCULAR | Status: AC
  Administered 2023-09-21: 125 mg via INTRAVENOUS
  Filled 2023-09-21: qty 2

## 2023-09-21 MED ORDER — ALBUTEROL (5 MG/ML) CONTINUOUS INHALATION SOLN
10.0000 mg/h | INHALATION_SOLUTION | Freq: Once | RESPIRATORY_TRACT | Status: DC
  Filled 2023-09-21: qty 20

## 2023-09-21 MED ORDER — GUAIFENESIN-DM 100-10 MG/5ML PO SYRP
5.0000 mL | ORAL_SOLUTION | ORAL | Status: DC | PRN
  Administered 2023-09-21 – 2023-09-22 (×2): 5 mL via ORAL
  Filled 2023-09-21 (×2): qty 5

## 2023-09-21 MED ORDER — ALBUTEROL SULFATE (2.5 MG/3ML) 0.083% IN NEBU
INHALATION_SOLUTION | RESPIRATORY_TRACT | Status: AC
  Administered 2023-09-21: 10 mg
  Filled 2023-09-21: qty 12

## 2023-09-21 NOTE — Progress Notes (Signed)
 Placed patient on HFNC on 6L.  Patient is currently getting CAT treatment and seems to have calmed down some.  Patient stating he took Albuterol treatment that was given to him a while back in this ER for his coughing.  According to patient, he has been using the Albuterol inhaler when he starts having a coughing spell.  HR has been in the 120s, RR has decreased some, at time of starting treatment, rate was at 24.  CXR just performed.

## 2023-09-21 NOTE — ED Provider Notes (Signed)
  EMERGENCY DEPARTMENT AT [facility] Provider Note   CSN: [id number] Arrival date & time: 09/21/23  2111     History  Chief Complaint  Patient presents with   Shortness of Breath    Pt from home here for SOB and cough, states that he's been sick for a couple weeks sats 85% RA in Triage, placed on 4L in triage and came up to 90%. Does not use O2 @ baseline. Pt Denies chest pain. Has been seeing PCP but nothing has helped. Recently finished doses of prednisone.     Bruce Fisher is a 63 y.o. male.  This is a 63 year old male presenting emergency department for shortness of breath.  Reports that he is had some worsening shortness of breath symptoms over the past several months has been taking inhalers and prednisone, symptoms seemingly worsened acutely over the past 48 hours.  Endorses some mild lower extremity edema.  Denies history of heart failure.  Has not been formally diagnosed with COPD or asthma.  Not having any chest pain.   Shortness of Breath      Home Medications Prior to Admission medications   Medication Sig Start Date End Date Taking? Authorizing Provider  albuterol (VENTOLIN HFA) 108 (90 Base) MCG/ACT inhaler Inhale 2 puffs into the lungs every 4 (four) hours as needed for wheezing or shortness of breath. 06/11/23  Yes Beatty, Celeste A, PA-C  amLODipine (NORVASC) 10 MG tablet Take 1 tablet (10 mg total) by mouth daily. for blood pressure 03/16/23  Yes Stacks, Broadus John, MD  Cholecalciferol (VITAMIN D PO) Take 1 tablet by mouth daily.   Yes [provider]  Coenzyme Q10 (CO Q-10) 100 MG CAPS Take 100 mg by mouth daily. 06/22/22  Yes Mechele Claude, MD  diphenhydrAMINE (BENADRYL) 25 mg capsule Take 1 capsule (25 mg total) by mouth at bedtime. 08/24/23  Yes Dettinger, Elige Radon, MD  loratadine (CLARITIN) 10 MG tablet Take 1 tablet (10 mg total) by mouth daily. 08/24/23  Yes Dettinger, Elige Radon, MD  metFORMIN (GLUCOPHAGE) 500 MG tablet Take 1 tablet  (500 mg total) by mouth daily with breakfast. 03/16/23 03/10/24 Yes Stacks, Broadus John, MD  Multiple Vitamin (MULTIVITAMIN WITH MINERALS) TABS tablet Take 1 tablet by mouth daily.   Yes [provider]  rosuvastatin (CRESTOR) 20 MG tablet Take 1 tablet (20 mg total) by mouth daily. for cholesterol. 03/16/23  Yes Mechele Claude, MD  triamcinolone (NASACORT) 55 MCG/ACT AERO nasal inhaler Place 2 sprays into the nose at bedtime. 08/24/23  Yes Dettinger, Elige Radon, MD  triamterene-hydrochlorothiazide (MAXZIDE-25) 37.5-25 MG tablet Take 1 tablet by mouth daily. 09/03/23  Yes [provider]  valsartan-hydrochlorothiazide (DIOVAN-HCT) 160-25 MG tablet Take 1 tablet by mouth daily. 09/07/23  Yes Mechele Claude, MD      Allergies    Patient has no known allergies.    Review of Systems   Review of Systems  Respiratory:  Positive for shortness of breath.     Physical Exam Updated Vital Signs BP 133/75   Pulse (!) 106   Temp 98.8 F (37.1 C) (Oral)   Resp (!) 24   SpO2 97%  Physical Exam Vitals and nursing note reviewed.  Constitutional:      General: He is in acute distress.  Cardiovascular:     Rate and Rhythm: Regular rhythm. Tachycardia present.  Pulmonary:     Comments: Patient in moderate respiratory distress.  Sitting on the edge of the bed tripoding.  Accessory muscle use.  Diffuse wheezing in all lung fields.  On 6 L nasal cannula oxygen saturation low 90s. Musculoskeletal:     Cervical back: Normal range of motion.     Right lower leg: No edema.     Left lower leg: No edema.  Skin:    General: Skin is warm.     Capillary Refill: Capillary refill takes less than 2 seconds.  Neurological:     Mental Status: He is alert and oriented to person, place, and time.  Psychiatric:        Mood and Affect: Mood normal.        Behavior: Behavior normal.     ED Results / Procedures / Treatments   Labs (all labs ordered are listed, but only abnormal results are displayed) Labs  Reviewed  BASIC METABOLIC PANEL - Abnormal; Notable for the following components:      Result Value   Glucose, Bld 151 (*)    All other components within normal limits  CBC  BRAIN NATRIURETIC PEPTIDE  D-DIMER, QUANTITATIVE  TROPONIN I (HIGH SENSITIVITY)  TROPONIN I (HIGH SENSITIVITY)    EKG EKG Interpretation Date/Time:  Thursday September 21 2023 21:46:54 EST Ventricular Rate:  120 PR Interval:  120 QRS Duration:  83 QT Interval:  372 QTC Calculation: 526 R Axis:   92  Text Interpretation: Sinus tachycardia Paired ventricular premature complexes Consider right atrial enlargement Probable RVH w/ secondary repol abnormality Prolonged QT interval Confirmed by Estanislado Pandy [phone]) on 09/21/2023 10:33:13 PM  Radiology DG Chest Portable 1 View Result Date: 09/21/2023 CLINICAL DATA:  Shortness of breath.  Cough. EXAM: PORTABLE CHEST 1 VIEW COMPARISON:  06/11/2023 FINDINGS: Heart size and pulmonary vascularity are normal. Emphysematous changes in the lungs. No airspace disease or consolidation. No pleural effusion or pneumothorax. Mediastinal contours appear intact. Degenerative changes in the spine. IMPRESSION: Emphysematous changes likely in the lungs. No evidence of active pulmonary disease. Electronically Signed   By: Burman Nieves M.D.   On: 09/21/2023 23:26    Procedures .Critical Care  Performed by: Coral Spikes, DO Authorized by: Coral Spikes, DO   Critical care provider statement:    Critical care time (minutes):  30   Critical care was necessary to treat or prevent imminent or life-threatening deterioration of the following conditions:  Respiratory failure   Critical care was time spent personally by me on the following activities:  Development of treatment plan with patient or surrogate, discussions with consultants, evaluation of patient's response to treatment, examination of patient, ordering and review of laboratory studies, ordering and review of radiographic  studies, ordering and performing treatments and interventions, pulse oximetry, re-evaluation of patient's condition and review of old charts   Care discussed with: admitting provider       Medications Ordered in ED Medications  albuterol (PROVENTIL,VENTOLIN) solution continuous neb (10 mg/hr Nebulization Not Given 09/21/23 2204)  albuterol (PROVENTIL) (2.5 MG/3ML) 0.083% nebulizer solution (10 mg  Given 09/21/23 2203)  methylPREDNISolone sodium succinate (SOLU-MEDROL) 125 mg/2 mL injection 125 mg (125 mg Intravenous Given 09/21/23 2250)    ED Course/ Medical Decision Making/ A&P Clinical Course as of 09/21/23 2350  Thu Sep 21, 2023  2128 08/14/18 echo with low normal EF.  [TY]  2302 Spoke with hospitalist who agrees to see and admit patient. [TY]    Clinical Course User Index [TY] Coral Spikes, DO  Medical Decision Making This is a 63 year old male presenting emergency department for shortness of breath.  Afebrile, tachycardic, slightly hypertensive.  He is hypoxic requiring 6 L for saturations in the low 90s.  He is in moderate to severe respiratory distress with diffuse wheezing and tripoding.  Given hour-long breathing treatment with significant improvement of symptoms.  Troponin negative, BNP negative.  Cardiac etiology less likely.  Cannot exclude PE, but otherwise low risk based on Wells criteria.  D-dimer negative.  No leukocytosis or fever to suggest systemic infection.  No significant metabolic derangements.  His presentation today consistent with COPD/asthma.  Given his new oxygen requirements and hour-long breathing treatment will admit for further management.  He was given Solu-Medrol here in the emergency department.  Case discussed with hospitalist who will see and admit patient.  Amount and/or Complexity of Data Reviewed Independent Historian:     Details: Family member notes that symptoms seemingly worsened in the past 48 hours External Data  Reviewed:     Details: See ED course Labs: ordered. Decision-making details documented in ED Course. Radiology: ordered. Decision-making details documented in ED Course. ECG/medicine tests: ordered. Decision-making details documented in ED Course.  Risk Prescription drug management. Decision regarding hospitalization.         Final Clinical Impression(s) / ED Diagnoses Final diagnoses:  Acute hypoxic respiratory failure [facility])    Rx / DC Orders ED Discharge Orders     None         Coral Spikes, DO 09/21/23 2350

## 2023-09-21 NOTE — ED Triage Notes (Signed)
 Pt from home here for SOB and cough, states that he's been sick for a couple weeks sats 85% RA in Triage, placed on 4L in triage and came up to 90%. Does not use O2 @ baseline. Pt Denies chest pain. Has been seeing PCP but nothing has helped. Recently finished doses of prednisone.

## 2023-09-21 NOTE — H&P (Signed)
 History and Physical    Patient: Bruce Fisher EAV:[id number] DOB: Jun 12, 1961 DOA: 09/21/2023 DOS: the patient was seen and examined on 09/22/2023 PCP: Mechele Claude, MD  Patient coming from: Home  Chief Complaint:  Chief Complaint  Patient presents with   Shortness of Breath    Pt from home here for SOB and cough, states that he's been sick for a couple weeks sats 85% RA in Triage, placed on 4L in triage and came up to 90%. Does not use O2 @ baseline. Pt Denies chest pain. Has been seeing PCP but nothing has helped. Recently finished doses of prednisone.    HPI: Bruce Fisher is a 63 y.o. male with medical history significant of postnasal drip, hypertension, type 2 diabetes mellitus, hyperlipidemia who presents to the emergency department due to 6 month history of nonproductive cough, throat irritation from postnasal drip which worsened over the past 48 to 72 hours.  Patient states that she has been following up with his PCP and has been using inhalers and prednisone without much improvement.  He denies any history of COPD or asthma.  Patient states that he has an appointment with an allergist on 10/09/2023.  Patient denies chest pain.  ED Course:  In the Emergency Department, he was tachypneic, tachycardic, BP was 143/95.  O2 sat on arrival to the ED was 85% on room air and was provided with supplemental oxygen at 6 LPM with improvement in O2 sat to 92-94%.  Workup in the ED showed normal CBC and BMP except for blood glucose of 151, D-dimer 0.49, troponin x 2 was flat at 2, BNP 76.0 Chest x-ray showed emphysematous changes likely in the lungs.  No evidence of active pulmonary disease IV Solu-Medrol 125 mg x 1 was given, breathing treatment was provided.  Hospitalist was asked admit patient for further evaluation and management.  Review of Systems: Review of systems as noted in the HPI. All other systems reviewed and are negative.   Past Medical History:  Diagnosis Date   Hyperlipidemia     Hypertension    Kidney stones    Rectal spasm    Renal disorder    kidney stones   Past Surgical History:  Procedure Laterality Date   SPINE SURGERY     lower back    Social History:  reports that he quit smoking about 30 years ago. His smoking use included cigarettes. He has never used smokeless tobacco. He reports that he does not drink alcohol and does not use drugs.   No Known Allergies  Family History  Problem Relation Age of Onset   Diabetes Mother    Heart failure Mother    Cancer Father        Asbestos   Cancer Sister        breast   Heart disease Sister        valve     Prior to Admission medications   Medication Sig Start Date End Date Taking? Authorizing Provider  albuterol (VENTOLIN HFA) 108 (90 Base) MCG/ACT inhaler Inhale 2 puffs into the lungs every 4 (four) hours as needed for wheezing or shortness of breath. 06/11/23  Yes Beatty, Celeste A, PA-C  amLODipine (NORVASC) 10 MG tablet Take 1 tablet (10 mg total) by mouth daily. for blood pressure 03/16/23  Yes Stacks, Broadus John, MD  Cholecalciferol (VITAMIN D PO) Take 1 tablet by mouth daily.   Yes [provider]  Coenzyme Q10 (CO Q-10) 100 MG CAPS Take 100 mg by mouth daily. 06/22/22  Yes Mechele Claude, MD  diphenhydrAMINE (BENADRYL) 25 mg capsule Take 1 capsule (25 mg total) by mouth at bedtime. 08/24/23  Yes Dettinger, Elige Radon, MD  loratadine (CLARITIN) 10 MG tablet Take 1 tablet (10 mg total) by mouth daily. 08/24/23  Yes Dettinger, Elige Radon, MD  metFORMIN (GLUCOPHAGE) 500 MG tablet Take 1 tablet (500 mg total) by mouth daily with breakfast. 03/16/23 03/10/24 Yes Stacks, Broadus John, MD  Multiple Vitamin (MULTIVITAMIN WITH MINERALS) TABS tablet Take 1 tablet by mouth daily.   Yes [provider]  rosuvastatin (CRESTOR) 20 MG tablet Take 1 tablet (20 mg total) by mouth daily. for cholesterol. 03/16/23  Yes Mechele Claude, MD  triamcinolone (NASACORT) 55 MCG/ACT AERO nasal inhaler Place 2 sprays into  the nose at bedtime. 08/24/23  Yes Dettinger, Elige Radon, MD  triamterene-hydrochlorothiazide (MAXZIDE-25) 37.5-25 MG tablet Take 1 tablet by mouth daily. 09/03/23  Yes [provider]  valsartan-hydrochlorothiazide (DIOVAN-HCT) 160-25 MG tablet Take 1 tablet by mouth daily. 09/07/23  Yes Mechele Claude, MD    Physical Exam: BP 120/68   Pulse (!) 101   Temp 98.7 F (37.1 C) (Oral)   Resp (!) 24   SpO2 94%   General: 63 y.o. year-old male well developed well nourished in no acute distress.  Alert and oriented x3. HEENT: NCAT, EOMI Neck: Supple, trachea medial Cardiovascular: Tachycardia.  Regular rate and rhythm with no rubs or gallops.  No thyromegaly or JVD noted.  No lower extremity edema. 2/4 pulses in all 4 extremities. Respiratory: Tachypnea.  Diffuse rhonchi and wheezes on auscultation.   Abdomen: Soft, nontender nondistended with normal bowel sounds x4 quadrants. Muskuloskeletal: No cyanosis, clubbing or edema noted bilaterally Neuro: CN II-XII intact, strength 5/5 x 4, sensation, reflexes intact Skin: No ulcerative lesions noted or rashes Psychiatry: Judgement and insight appear normal. Mood is appropriate for condition and setting          Labs on Admission:  Basic Metabolic Panel: Recent Labs  Lab 09/20/23 0903 09/21/23 2145  NA 144 137  K 3.9 3.5  CL 105 102  CO2 21 24  GLUCOSE 126* 151*  BUN 16 20  CREATININE 1.05 1.15  CALCIUM 9.7 9.1   Liver Function Tests: Recent Labs  Lab 09/20/23 0903  AST 19  ALT 19  ALKPHOS 60  BILITOT 0.4  PROT 7.5  ALBUMIN 4.4   No results for input(s): "LIPASE", "AMYLASE" in the last 168 hours. No results for input(s): "AMMONIA" in the last 168 hours. CBC: Recent Labs  Lab 09/20/23 0903 09/21/23 2145  WBC WILL FOLLOW 9.5  NEUTROABS WILL FOLLOW  --   HGB WILL FOLLOW 15.7  HCT WILL FOLLOW 47.5  MCV WILL FOLLOW 88.6  PLT WILL FOLLOW 193   Cardiac Enzymes: No results for input(s): "CKTOTAL", "CKMB", "CKMBINDEX",  "TROPONINI" in the last 168 hours.  BNP (last 3 results) Recent Labs    06/11/23 2234 09/21/23 2145  BNP 63.0 76.0    ProBNP (last 3 results) No results for input(s): "PROBNP" in the last 8760 hours.  CBG: No results for input(s): "GLUCAP" in the last 168 hours.  Radiological Exams on Admission: DG Chest Portable 1 View Result Date: 09/21/2023 CLINICAL DATA:  Shortness of breath.  Cough. EXAM: PORTABLE CHEST 1 VIEW COMPARISON:  06/11/2023 FINDINGS: Heart size and pulmonary vascularity are normal. Emphysematous changes in the lungs. No airspace disease or consolidation. No pleural effusion or pneumothorax. Mediastinal contours appear intact. Degenerative changes in the spine. IMPRESSION: Emphysematous changes likely in  the lungs. No evidence of active pulmonary disease. Electronically Signed   By: Burman Nieves M.D.   On: 09/21/2023 23:26    EKG: I independently viewed the EKG done and my findings are as followed: Sinus tachycardia at a rate of 120 bpm with paired PVCs and QTc of 526 ms  Assessment/Plan Present on Admission:  Acute exacerbation of chronic obstructive pulmonary disease (COPD) (HCC)  Essential hypertension  Mixed hyperlipidemia  Principal Problem:   Acute exacerbation of chronic obstructive pulmonary disease (COPD) (HCC) Active Problems:   Mixed hyperlipidemia   Essential hypertension   Acute respiratory failure with hypoxia (HCC)   History of postnasal drip   Prolonged QT interval   Type 2 diabetes mellitus with hyperglycemia (HCC)  Acute exacerbation of COPD Acute respiratory failure with hypoxia Chest x-ray personally reviewed showed emphysematous changes, though patient denies any known history of COPD Continue Xopenex, Mucinex, Robitussin, Solu-Medrol, azithromycin. Continue Protonix to prevent steroid-induced ulcer Continue incentive spirometry and flutter valve Continue supplemental oxygen to maintain O2 sat > 92% with plan to wean patient off  oxygen as tolerated  History of postnasal drip Continue home Benadryl, Claritin Continue Flonase Patient has a pending appointment with an allergist  Prolonged QT interval-resolved Admission EKG showed prolonged QTc of 526 ms.  This has corrected in a repeat EKG which personally reviewed showed sinus tachycardia at rate of 101 bpm with a QTc of 464 ms with multiform PVCs  Essential hypertension Continue amlodipine, Maxide-25  Mixed hyperlipidemia Continue Crestor  Type 2 diabetes mellitus with hyperglycemia Hemoglobin A1c on 08/24/2023 was 7.2 Continue ISS and hypoglycemia protocol   DVT prophylaxis: Lovenox  Family Communication: None at bedside   Advance Care Planning:   Code Status: Full Code   Consults: None  Severity of Illness: The appropriate patient status for this patient is OBSERVATION. Observation status is judged to be reasonable and necessary in order to provide the required intensity of service to ensure the patient's safety. The patient's presenting symptoms, physical exam findings, and initial radiographic and laboratory data in the context of their medical condition is felt to place them at decreased risk for further clinical deterioration. Furthermore, it is anticipated that the patient will be medically stable for discharge from the hospital within 2 midnights of admission.   Author: Frankey Shown, DO 09/22/2023 7:10 AM  For on call review [email or web address].

## 2023-09-21 NOTE — ED Notes (Signed)
 This RN made respiratory aware that the pt is having trouble breathing with tripoding noted, with the pt on 6L [place] SpO2 at 91% and RR 30. Pt is not speaking in complete sentences.

## 2023-09-22 ENCOUNTER — Inpatient Hospital Stay (HOSPITAL_COMMUNITY): Payer: Medicaid Other

## 2023-09-22 ENCOUNTER — Encounter (HOSPITAL_COMMUNITY): Payer: Self-pay | Admitting: Internal Medicine

## 2023-09-22 DIAGNOSIS — J9601 Acute respiratory failure with hypoxia: Secondary | ICD-10-CM | POA: Insufficient documentation

## 2023-09-22 DIAGNOSIS — I493 Ventricular premature depolarization: Secondary | ICD-10-CM | POA: Diagnosis not present

## 2023-09-22 DIAGNOSIS — R918 Other nonspecific abnormal finding of lung field: Secondary | ICD-10-CM | POA: Diagnosis not present

## 2023-09-22 DIAGNOSIS — I10 Essential (primary) hypertension: Secondary | ICD-10-CM | POA: Diagnosis not present

## 2023-09-22 DIAGNOSIS — E1165 Type 2 diabetes mellitus with hyperglycemia: Secondary | ICD-10-CM | POA: Insufficient documentation

## 2023-09-22 DIAGNOSIS — R000 Tachycardia, unspecified: Secondary | ICD-10-CM | POA: Diagnosis not present

## 2023-09-22 DIAGNOSIS — Z79899 Other long term (current) drug therapy: Secondary | ICD-10-CM | POA: Diagnosis not present

## 2023-09-22 DIAGNOSIS — R0602 Shortness of breath: Secondary | ICD-10-CM | POA: Diagnosis not present

## 2023-09-22 DIAGNOSIS — J342 Deviated nasal septum: Secondary | ICD-10-CM | POA: Diagnosis not present

## 2023-09-22 DIAGNOSIS — Z8249 Family history of ischemic heart disease and other diseases of the circulatory system: Secondary | ICD-10-CM | POA: Diagnosis not present

## 2023-09-22 DIAGNOSIS — Y92239 Unspecified place in hospital as the place of occurrence of the external cause: Secondary | ICD-10-CM | POA: Diagnosis not present

## 2023-09-22 DIAGNOSIS — Z87442 Personal history of urinary calculi: Secondary | ICD-10-CM | POA: Diagnosis not present

## 2023-09-22 DIAGNOSIS — J310 Chronic rhinitis: Secondary | ICD-10-CM | POA: Diagnosis not present

## 2023-09-22 DIAGNOSIS — T380X5A Adverse effect of glucocorticoids and synthetic analogues, initial encounter: Secondary | ICD-10-CM | POA: Diagnosis not present

## 2023-09-22 DIAGNOSIS — J101 Influenza due to other identified influenza virus with other respiratory manifestations: Secondary | ICD-10-CM | POA: Diagnosis not present

## 2023-09-22 DIAGNOSIS — J324 Chronic pansinusitis: Secondary | ICD-10-CM | POA: Diagnosis not present

## 2023-09-22 DIAGNOSIS — E782 Mixed hyperlipidemia: Secondary | ICD-10-CM | POA: Diagnosis not present

## 2023-09-22 DIAGNOSIS — Z87891 Personal history of nicotine dependence: Secondary | ICD-10-CM | POA: Diagnosis not present

## 2023-09-22 DIAGNOSIS — Z87898 Personal history of other specified conditions: Secondary | ICD-10-CM

## 2023-09-22 DIAGNOSIS — R600 Localized edema: Secondary | ICD-10-CM | POA: Diagnosis not present

## 2023-09-22 DIAGNOSIS — Z833 Family history of diabetes mellitus: Secondary | ICD-10-CM | POA: Diagnosis not present

## 2023-09-22 DIAGNOSIS — Z7984 Long term (current) use of oral hypoglycemic drugs: Secondary | ICD-10-CM | POA: Diagnosis not present

## 2023-09-22 DIAGNOSIS — J441 Chronic obstructive pulmonary disease with (acute) exacerbation: Secondary | ICD-10-CM | POA: Diagnosis not present

## 2023-09-22 DIAGNOSIS — J019 Acute sinusitis, unspecified: Secondary | ICD-10-CM | POA: Diagnosis not present

## 2023-09-22 DIAGNOSIS — R059 Cough, unspecified: Secondary | ICD-10-CM | POA: Diagnosis not present

## 2023-09-22 DIAGNOSIS — R9431 Abnormal electrocardiogram [ECG] [EKG]: Secondary | ICD-10-CM | POA: Diagnosis present

## 2023-09-22 DIAGNOSIS — Z1152 Encounter for screening for COVID-19: Secondary | ICD-10-CM | POA: Diagnosis not present

## 2023-09-22 LAB — MAGNESIUM: Magnesium: 1.8 mg/dL (ref 1.7–2.4)

## 2023-09-22 LAB — CBC
HCT: 45.6 % (ref 39.0–52.0)
Hemoglobin: 14.9 g/dL (ref 13.0–17.0)
MCH: 29.3 pg (ref 26.0–34.0)
MCHC: 32.7 g/dL (ref 30.0–36.0)
MCV: 89.8 fL (ref 80.0–100.0)
Platelets: 182 10*3/uL (ref 150–400)
RBC: 5.08 MIL/uL (ref 4.22–5.81)
RDW: 14 % (ref 11.5–15.5)
WBC: 10.2 10*3/uL (ref 4.0–10.5)
nRBC: 0 % (ref 0.0–0.2)

## 2023-09-22 LAB — RESPIRATORY PANEL BY PCR

## 2023-09-22 LAB — CBG MONITORING, ED: Glucose-Capillary: 234 mg/dL — ABNORMAL HIGH (ref 70–99)

## 2023-09-22 LAB — COMPREHENSIVE METABOLIC PANEL
ALT: 24 U/L (ref 0–44)
AST: 26 U/L (ref 15–41)
Albumin: 4.2 g/dL (ref 3.5–5.0)
Alkaline Phosphatase: 46 U/L (ref 38–126)
Anion gap: 14 (ref 5–15)
BUN: 17 mg/dL (ref 6–20)
CO2: 21 mmol/L — ABNORMAL LOW (ref 22–32)
Calcium: 9 mg/dL (ref 8.9–10.3)
Chloride: 101 mmol/L (ref 98–111)
Creatinine, Ser: 1.01 mg/dL (ref 0.61–1.24)
GFR, Estimated: >60 mL/min (ref >60)
Glucose, Bld: 202 mg/dL — ABNORMAL HIGH (ref 70–99)
Potassium: 3.4 mmol/L — ABNORMAL LOW (ref 3.5–5.1)
Sodium: 136 mmol/L (ref 135–145)
Total Bilirubin: 0.7 mg/dL (ref 0.0–1.2)
Total Protein: 8 g/dL (ref 6.5–8.1)

## 2023-09-22 LAB — RESP PANEL BY RT-PCR (RSV, FLU A&B, COVID)  RVPGX2
Influenza A by PCR: POSITIVE — AB
Influenza B by PCR: NEGATIVE
Resp Syncytial Virus by PCR: NEGATIVE
SARS Coronavirus 2 by RT PCR: NEGATIVE

## 2023-09-22 LAB — HEMOGLOBIN A1C
Hgb A1c MFr Bld: 6.9 % — ABNORMAL HIGH (ref 4.8–5.6)
Mean Plasma Glucose: 151.33 mg/dL

## 2023-09-22 LAB — TROPONIN I (HIGH SENSITIVITY): Troponin I (High Sensitivity): 2 ng/L (ref <18)

## 2023-09-22 LAB — HIV ANTIBODY (ROUTINE TESTING W REFLEX): HIV Screen 4th Generation wRfx: NONREACTIVE

## 2023-09-22 LAB — MRSA NEXT GEN BY PCR, NASAL: MRSA by PCR Next Gen: NOT DETECTED

## 2023-09-22 LAB — GLUCOSE, CAPILLARY
Glucose-Capillary: 139 mg/dL — ABNORMAL HIGH (ref 70–99)
Glucose-Capillary: 150 mg/dL — ABNORMAL HIGH (ref 70–99)

## 2023-09-22 LAB — PHOSPHORUS: Phosphorus: 2.6 mg/dL (ref 2.5–4.6)

## 2023-09-22 MED ORDER — CO Q-10 100 MG PO CAPS: 100.0000 mg | ORAL_CAPSULE | Freq: Every day | ORAL | Status: DC

## 2023-09-22 MED ORDER — MONTELUKAST SODIUM 10 MG PO TABS
10.0000 mg | ORAL_TABLET | Freq: Every day | ORAL | Status: DC
  Administered 2023-09-22 – 2023-09-23 (×2): 10 mg via ORAL
  Filled 2023-09-22 (×2): qty 1

## 2023-09-22 MED ORDER — METHYLPREDNISOLONE SODIUM SUCC 40 MG IJ SOLR
40.0000 mg | Freq: Two times a day (BID) | INTRAMUSCULAR | Status: DC
  Administered 2023-09-22 – 2023-09-24 (×5): 40 mg via INTRAVENOUS
  Filled 2023-09-22 (×5): qty 1

## 2023-09-22 MED ORDER — AZITHROMYCIN 250 MG PO TABS: 250.0000 mg | ORAL_TABLET | Freq: Every day | ORAL | Status: DC

## 2023-09-22 MED ORDER — POTASSIUM CHLORIDE CRYS ER 20 MEQ PO TBCR
40.0000 meq | EXTENDED_RELEASE_TABLET | Freq: Once | ORAL | Status: AC
Start: 2023-09-22 — End: 2023-09-22
  Administered 2023-09-22: 40 meq via ORAL
  Filled 2023-09-22: qty 2

## 2023-09-22 MED ORDER — DOXYCYCLINE HYCLATE 100 MG PO TABS: 100.0000 mg | ORAL_TABLET | Freq: Two times a day (BID) | ORAL | Status: DC

## 2023-09-22 MED ORDER — SODIUM CHLORIDE 0.9 % IV SOLN
1.0000 g | INTRAVENOUS | Status: DC
  Administered 2023-09-22 – 2023-09-23 (×2): 1 g via INTRAVENOUS
  Filled 2023-09-22 (×2): qty 10

## 2023-09-22 MED ORDER — AZITHROMYCIN 250 MG PO TABS
500.0000 mg | ORAL_TABLET | Freq: Every day | ORAL | Status: AC
Start: 2023-09-22 — End: 2023-09-22
  Administered 2023-09-22: 500 mg via ORAL
  Filled 2023-09-22: qty 2

## 2023-09-22 MED ORDER — SALINE SPRAY 0.65 % NA SOLN
2.0000 | NASAL | Status: DC
  Administered 2023-09-22 – 2023-09-23 (×3): 2 via NASAL
  Filled 2023-09-22: qty 44

## 2023-09-22 MED ORDER — DOXYCYCLINE HYCLATE 100 MG PO TABS
100.0000 mg | ORAL_TABLET | Freq: Two times a day (BID) | ORAL | Status: DC
  Administered 2023-09-23 – 2023-09-24 (×3): 100 mg via ORAL
  Filled 2023-09-22 (×3): qty 1

## 2023-09-22 MED ORDER — ACETAMINOPHEN 650 MG RE SUPP: 650.0000 mg | Freq: Four times a day (QID) | RECTAL | Status: DC | PRN

## 2023-09-22 MED ORDER — INSULIN ASPART 100 UNIT/ML IJ SOLN
2.0000 [IU] | Freq: Three times a day (TID) | INTRAMUSCULAR | Status: DC
  Administered 2023-09-22 – 2023-09-24 (×6): 2 [IU] via SUBCUTANEOUS

## 2023-09-22 MED ORDER — LORATADINE 10 MG PO TABS
10.0000 mg | ORAL_TABLET | Freq: Every day | ORAL | Status: DC
  Administered 2023-09-22 – 2023-09-24 (×3): 10 mg via ORAL
  Filled 2023-09-22 (×3): qty 1

## 2023-09-22 MED ORDER — PANTOPRAZOLE SODIUM 40 MG PO TBEC
40.0000 mg | DELAYED_RELEASE_TABLET | Freq: Every day | ORAL | Status: DC
  Administered 2023-09-22 – 2023-09-24 (×3): 40 mg via ORAL
  Filled 2023-09-22 (×3): qty 1

## 2023-09-22 MED ORDER — HYDROCOD POLI-CHLORPHE POLI ER 10-8 MG/5ML PO SUER
5.0000 mL | Freq: Two times a day (BID) | ORAL | Status: DC | PRN
  Administered 2023-09-22 (×2): 5 mL via ORAL
  Filled 2023-09-22 (×2): qty 5

## 2023-09-22 MED ORDER — DM-GUAIFENESIN ER 30-600 MG PO TB12
1.0000 | ORAL_TABLET | Freq: Two times a day (BID) | ORAL | Status: DC
  Administered 2023-09-22 – 2023-09-24 (×5): 1 via ORAL
  Filled 2023-09-22 (×5): qty 1

## 2023-09-22 MED ORDER — LEVALBUTEROL HCL 0.63 MG/3ML IN NEBU
0.6300 mg | INHALATION_SOLUTION | Freq: Four times a day (QID) | RESPIRATORY_TRACT | Status: DC
  Administered 2023-09-22 – 2023-09-24 (×11): 0.63 mg via RESPIRATORY_TRACT
  Filled 2023-09-22 (×12): qty 3

## 2023-09-22 MED ORDER — CHLORHEXIDINE GLUCONATE CLOTH 2 % EX PADS: 6.0000 | MEDICATED_PAD | Freq: Every day | CUTANEOUS | Status: DC

## 2023-09-22 MED ORDER — LABETALOL HCL 5 MG/ML IV SOLN
10.0000 mg | INTRAVENOUS | Status: DC | PRN
  Administered 2023-09-22: 10 mg via INTRAVENOUS
  Filled 2023-09-22: qty 4

## 2023-09-22 MED ORDER — IPRATROPIUM BROMIDE 0.02 % IN SOLN
0.5000 mg | RESPIRATORY_TRACT | Status: DC
  Administered 2023-09-22 – 2023-09-23 (×4): 0.5 mg via RESPIRATORY_TRACT
  Filled 2023-09-22 (×4): qty 2.5

## 2023-09-22 MED ORDER — ROSUVASTATIN CALCIUM 20 MG PO TABS
20.0000 mg | ORAL_TABLET | Freq: Every day | ORAL | Status: DC
  Administered 2023-09-22 – 2023-09-24 (×3): 20 mg via ORAL
  Filled 2023-09-22 (×3): qty 1

## 2023-09-22 MED ORDER — FLUTICASONE PROPIONATE 50 MCG/ACT NA SUSP
2.0000 | Freq: Every day | NASAL | Status: DC
  Administered 2023-09-23 – 2023-09-24 (×2): 2 via NASAL

## 2023-09-22 MED ORDER — FLUTICASONE PROPIONATE 50 MCG/ACT NA SUSP
1.0000 | Freq: Every day | NASAL | Status: DC
  Administered 2023-09-22: 1 via NASAL
  Filled 2023-09-22: qty 16

## 2023-09-22 MED ORDER — ACETAMINOPHEN 325 MG PO TABS
650.0000 mg | ORAL_TABLET | Freq: Four times a day (QID) | ORAL | Status: DC | PRN
  Administered 2023-09-22: 650 mg via ORAL
  Filled 2023-09-22: qty 2

## 2023-09-22 MED ORDER — AMLODIPINE BESYLATE 5 MG PO TABS
10.0000 mg | ORAL_TABLET | Freq: Every day | ORAL | Status: DC
  Administered 2023-09-22 – 2023-09-24 (×3): 10 mg via ORAL
  Filled 2023-09-22 (×3): qty 2

## 2023-09-22 MED ORDER — TRIAMTERENE-HCTZ 37.5-25 MG PO TABS
1.0000 | ORAL_TABLET | Freq: Every day | ORAL | Status: DC
  Administered 2023-09-23 – 2023-09-24 (×2): 1 via ORAL
  Filled 2023-09-22 (×6): qty 1

## 2023-09-22 MED ORDER — PROCHLORPERAZINE EDISYLATE 10 MG/2ML IJ SOLN: 10.0000 mg | Freq: Four times a day (QID) | INTRAMUSCULAR | Status: DC | PRN

## 2023-09-22 MED ORDER — ENOXAPARIN SODIUM 40 MG/0.4ML IJ SOSY
40.0000 mg | PREFILLED_SYRINGE | INTRAMUSCULAR | Status: DC
  Administered 2023-09-22 – 2023-09-24 (×3): 40 mg via SUBCUTANEOUS
  Filled 2023-09-22 (×3): qty 0.4

## 2023-09-22 MED ORDER — INSULIN ASPART 100 UNIT/ML IJ SOLN
0.0000 [IU] | Freq: Three times a day (TID) | INTRAMUSCULAR | Status: DC
  Administered 2023-09-22: 3 [IU] via SUBCUTANEOUS
  Administered 2023-09-22: 7 [IU] via SUBCUTANEOUS
  Administered 2023-09-23: 5 [IU] via SUBCUTANEOUS
  Administered 2023-09-23: 3 [IU] via SUBCUTANEOUS
  Administered 2023-09-23: 7 [IU] via SUBCUTANEOUS
  Administered 2023-09-24 (×2): 4 [IU] via SUBCUTANEOUS
  Filled 2023-09-22: qty 1

## 2023-09-22 MED ORDER — BISOPROLOL FUMARATE 5 MG PO TABS
5.0000 mg | ORAL_TABLET | Freq: Every day | ORAL | Status: DC
  Administered 2023-09-22 – 2023-09-23 (×2): 5 mg via ORAL
  Filled 2023-09-22 (×2): qty 1

## 2023-09-22 NOTE — ED Notes (Addendum)
 This RN noted that the pt is still coughing after receiving multiple cough medications and completing a reassessment of the pt lungs. This RN found the lungs to be clear and equal bilaterally in all fields with right side being diminished. This RN consulted with Respiratory if there was anything else she would suggest. She endorsed placing the pt on nebulizing NS. This RN nebulized NS 10cc for the pt.

## 2023-09-22 NOTE — Hospital Course (Signed)
 63 y.o. male with medical history significant of postnasal drip, hypertension, type 2 diabetes mellitus, hyperlipidemia who presents to the emergency department due to 6 month history of nonproductive cough, throat irritation from postnasal drip which worsened over the past 48 to 72 hours.  Patient states that she has been following up with his PCP and has been using inhalers and prednisone without much improvement.  He denies any history of COPD or asthma.  Patient states that he has an appointment with an allergist on 10/09/2023.  Patient denies chest pain.   In the Emergency Department, he was tachypneic, tachycardic, BP was 143/95.  O2 sat on arrival to the ED was 85% on room air and was provided with supplemental oxygen at 6 LPM with improvement in O2 sat to 92-94%.  Workup in the ED showed normal CBC and BMP except for blood glucose of 151, D-dimer 0.49, troponin x 2 was flat at 2, BNP 76.0 Chest x-ray showed emphysematous changes likely in the lungs.  No evidence of active pulmonary disease.  IV Solu-Medrol 125 mg x 1 was given, breathing treatment was provided.  Hospitalist was asked admit patient for further evaluation and management.

## 2023-09-22 NOTE — ED Notes (Signed)
 This RN noted inspiratory wheezing upon reassessment of the pts respiratory system. This RN contacted Respiratory Ladonna Snide to also reassess the pt and she contacted MD Adefeso to make him aware of the pts wheezing.

## 2023-09-22 NOTE — TOC CM/SW Note (Signed)
 Transition of Care [facility]) - Inpatient Brief Assessment   Patient Details  Name: Bruce Fisher MRN: [id number] Date of Birth: 10-04-1960  [facility] of [facility] CM/SW Contact:    Isabella Bowens, LCSWA Phone Number: 09/22/2023, 10:06 AM   Clinical Narrative:  Transition of Care Department [facility]) has reviewed patient and no TOC needs have been identified at this time. We will continue to monitor patient advancement through interdisciplinary progression rounds. If new patient transition needs arise, please place a TOC consult.   Transition of Care Asessment: Insurance and Status: Insurance coverage has been reviewed Patient has primary care physician: Yes Home environment has been reviewed: Single Family home with spouse Prior level of function:: Independent Prior/Current Home Services: No current home services Social Drivers of Health Review: SDOH reviewed no interventions necessary Readmission risk has been reviewed: Yes Transition of care needs: no transition of care needs at this time

## 2023-09-22 NOTE — Progress Notes (Signed)
 Patient arrived to room 2A11 from ICU 4.  Assessment complete, VS obtained.

## 2023-09-22 NOTE — Plan of Care (Signed)

## 2023-09-22 NOTE — Progress Notes (Signed)
 This RN ambulated patient to the bathroom. Oxygen dropped to 83% on 4L/min and he became extreme short of breath after he got back to his bed. Oxygen increased to 6 L/min with patient saturation  between 96-99%. Oxygen decreased down to 4L/min and patient made comfortable in bed.

## 2023-09-22 NOTE — ED Notes (Signed)
 This RN noted that the pt started having PAC's on the monitor, a 12 Lead was obtained and MD Adefeso was made aware of the EKG changes. Awaiting orders.

## 2023-09-22 NOTE — ED Notes (Signed)
 This RN assisted the pt to the bathroom in the wheelchair and noted that the pt was out of breath when we got back to the room just moving from wheelchair to the chair.

## 2023-09-22 NOTE — Progress Notes (Addendum)
 PROGRESS NOTE   Bruce Fisher  GNF:[id number] DOB: 1961-07-25 DOA: 09/21/2023 PCP: Mechele Claude, MD   Chief Complaint  Patient presents with   Shortness of Breath    Pt from home here for SOB and cough, states that he's been sick for a couple weeks sats 85% RA in Triage, placed on 4L in triage and came up to 90%. Does not use O2 @ baseline. Pt Denies chest pain. Has been seeing PCP but nothing has helped. Recently finished doses of prednisone.    Level of care: Stepdown  Brief Admission History:  63 y.o. male with medical history significant of postnasal drip, hypertension, type 2 diabetes mellitus, hyperlipidemia who presents to the emergency department due to 6 month history of nonproductive cough, throat irritation from postnasal drip which worsened over the past 48 to 72 hours.  Patient states that she has been following up with his PCP and has been using inhalers and prednisone without much improvement.  He denies any history of COPD or asthma.  Patient states that he has an appointment with an allergist on 10/09/2023.  Patient denies chest pain.   In the Emergency Department, he was tachypneic, tachycardic, BP was 143/95.  O2 sat on arrival to the ED was 85% on room air and was provided with supplemental oxygen at 6 LPM with improvement in O2 sat to 92-94%.  Workup in the ED showed normal CBC and BMP except for blood glucose of 151, D-dimer 0.49, troponin x 2 was flat at 2, BNP 76.0 Chest x-ray showed emphysematous changes likely in the lungs.  No evidence of active pulmonary disease.  IV Solu-Medrol 125 mg x 1 was given, breathing treatment was provided.  Hospitalist was asked admit patient for further evaluation and management.   Assessment and Plan:  Working diagnosis of acute COPD exacerbation - pt claims he does not have COPD, never been told he had COPD - he does have a remote tobacco history - he presented with cough and wheezing, dyspnea - d-dimer was negative - pt says  symptoms barely improved with nebs - given persistent symptoms, check CT chest without contrast - continue current management for presumed COPD exacerbation  - continue supplemental oxygen with goal to wean oxygen to room air as able   Severe chronic postnasal drainage  - pt reports this has been persistent for past several months - added singulair at bedtime - continue flonase nasal spray daily --- increase dose to 2 sprays / nost daily  - continue loratadine daily  - continue antibiotics as ordered to cover for sinusitis - check CT sinus study to rule out chronic sinusitis   Sinus tachycardia  - pt reports he has been dealing with this for several months as well - his EKG with findings of sinus tachycardia  - possibly secondary to nebs - he is on xopenex nebs at this time  - continue cardiac monitoring - repeat EKG in AM  - d dimer was negative so did not check for PE - obtain venous dopplers of legs to be sure no DVT  present  Acute respiratory failure with hypoxia - for now we are working with diagnosis of COPD exacerbation - follow CT chest study (pending)  - continue supplemental oxygen for now - wean oxygen as able   Uncontrolled type 2 diabetes mellitus with steroid induced hyperglycemia - added some prandial coverage and continue SSI coverage and frequent CBG monitoring  CBG (last 3)  Recent Labs    09/22/23 1226  GLUCAP 234*    DVT prophylaxis: enoxaparin  Code Status: Full  Family Communication:  Disposition: anticipating home    Consultants:   Procedures:   Antimicrobials:  Azithromycin 2/27-28 Doxycycline 2/28>> Ceftriaxone 2/28>>  Subjective: Pt reports he feels terrible, he has chronic shortness of breath, chronic postnasal drainage and intermittent tachycardia   Objective: Vitals:   09/22/23 0900 09/22/23 1030 09/22/23 1042 09/22/23 1100  BP: (!) 140/83 (!) 146/81  134/75  Pulse: (!) 109 (!) 109  (!) 112  Resp: (!) 21 (!) 29  (!) 26  Temp:    98.3 F (36.8 C)   TempSrc:   Oral   SpO2: 94% 97%  94%   No intake or output data in the 24 hours ending 09/22/23 1224 There were no vitals filed for this visit. Examination:  General exam: Appears calm and comfortable  Respiratory system: Clear to auscultation. Respiratory effort normal. (He just received neb) Cardiovascular system: tachycardic rate; normal S1 & S2 heard. No JVD, murmurs, rubs, gallops or clicks. No pedal edema. Gastrointestinal system: Abdomen is nondistended, soft and nontender. No organomegaly or masses felt. Normal bowel sounds heard. Central nervous system: Alert and oriented. No focal neurological deficits. Extremities: Symmetric 5 x 5 power. Skin: No rashes, lesions or ulcers. Psychiatry: Judgement and insight appear normal. Mood & affect appropriate.   Data Reviewed: I have personally reviewed following labs and imaging studies  CBC: Recent Labs  Lab 09/20/23 0903 09/21/23 2145 09/22/23 0801  WBC WILL FOLLOW 9.5 10.2  NEUTROABS WILL FOLLOW  --   --   HGB WILL FOLLOW 15.7 14.9  HCT WILL FOLLOW 47.5 45.6  MCV WILL FOLLOW 88.6 89.8  PLT WILL FOLLOW 193 182    Basic Metabolic Panel: Recent Labs  Lab 09/20/23 0903 09/21/23 2145 09/22/23 0801  NA 144 137 136  K 3.9 3.5 3.4*  CL 105 102 101  CO2 21 24 21*  GLUCOSE 126* 151* 202*  BUN 16 20 17   CREATININE 1.05 1.15 1.01  CALCIUM 9.7 9.1 9.0  MG  --   --  1.8  PHOS  --   --  2.6    CBG: No results for input(s): "GLUCAP" in the last 168 hours.  No results found for this or any previous visit (from the past 240 hours).   Radiology Studies: DG Chest Portable 1 View Result Date: 09/21/2023 CLINICAL DATA:  Shortness of breath.  Cough. EXAM: PORTABLE CHEST 1 VIEW COMPARISON:  06/11/2023 FINDINGS: Heart size and pulmonary vascularity are normal. Emphysematous changes in the lungs. No airspace disease or consolidation. No pleural effusion or pneumothorax. Mediastinal contours appear intact.  Degenerative changes in the spine. IMPRESSION: Emphysematous changes likely in the lungs. No evidence of active pulmonary disease. Electronically Signed   By: Burman Nieves M.D.   On: 09/21/2023 23:26   Scheduled Meds:  amLODipine  10 mg Oral Daily   dextromethorphan-guaiFENesin  1 tablet Oral BID   [START ON 09/23/2023] doxycycline  100 mg Oral Q12H   enoxaparin (LOVENOX) injection  40 mg Subcutaneous Q24H   fluticasone  1 spray Each Nare Daily   insulin aspart  0-20 Units Subcutaneous TID WC   insulin aspart  2 Units Subcutaneous TID WC   ipratropium  0.5 mg Nebulization Q4H   levalbuterol  0.63 mg Nebulization Q6H   loratadine  10 mg Oral Daily   methylPREDNISolone (SOLU-MEDROL) injection  40 mg Intravenous Q12H   montelukast  10 mg Oral QHS   pantoprazole  40  mg Oral Daily   rosuvastatin  20 mg Oral Daily   triamterene-hydrochlorothiazide  1 tablet Oral Daily   Continuous Infusions:   LOS: 0 days   Time spent: 58 mins  Tanicka Bisaillon Laural Benes, MD How to contact the [facility] Attending or Consulting provider 7A - 7P or covering provider during after hours 7P -7A, for this patient?  Check the care team in [facility] and look for a) attending/consulting [facility] provider listed and b) the [facility] team listed Log into [email or web address] to find provider on call.  Locate the [facility] provider you are looking for under [facility] and page to a number that you can be directly reached. If you still have difficulty reaching the provider, please page the [facility] (Director on Call) for the Hospitalists listed on amion for assistance.  09/22/2023, 12:24 PM

## 2023-09-22 NOTE — ED Notes (Signed)
 [place] leaving the bedside.

## 2023-09-23 DIAGNOSIS — E782 Mixed hyperlipidemia: Secondary | ICD-10-CM | POA: Diagnosis not present

## 2023-09-23 DIAGNOSIS — J324 Chronic pansinusitis: Secondary | ICD-10-CM | POA: Diagnosis present

## 2023-09-23 DIAGNOSIS — J441 Chronic obstructive pulmonary disease with (acute) exacerbation: Secondary | ICD-10-CM | POA: Diagnosis not present

## 2023-09-23 DIAGNOSIS — I10 Essential (primary) hypertension: Secondary | ICD-10-CM | POA: Diagnosis not present

## 2023-09-23 DIAGNOSIS — Z87898 Personal history of other specified conditions: Secondary | ICD-10-CM | POA: Diagnosis not present

## 2023-09-23 DIAGNOSIS — J329 Chronic sinusitis, unspecified: Secondary | ICD-10-CM | POA: Diagnosis present

## 2023-09-23 LAB — BASIC METABOLIC PANEL
Anion gap: 8 (ref 5–15)
BUN: 20 mg/dL (ref 6–20)
CO2: 24 mmol/L (ref 22–32)
Calcium: 8.6 mg/dL — ABNORMAL LOW (ref 8.9–10.3)
Chloride: 106 mmol/L (ref 98–111)
Creatinine, Ser: 0.78 mg/dL (ref 0.61–1.24)
GFR, Estimated: >60 mL/min (ref >60)
Glucose, Bld: 174 mg/dL — ABNORMAL HIGH (ref 70–99)
Potassium: 3.7 mmol/L (ref 3.5–5.1)
Sodium: 138 mmol/L (ref 135–145)

## 2023-09-23 LAB — GLUCOSE, CAPILLARY
Glucose-Capillary: 148 mg/dL — ABNORMAL HIGH (ref 70–99)
Glucose-Capillary: 126 mg/dL — ABNORMAL HIGH (ref 70–99)
Glucose-Capillary: 220 mg/dL — ABNORMAL HIGH (ref 70–99)
Glucose-Capillary: 126 mg/dL — ABNORMAL HIGH (ref 70–99)

## 2023-09-23 LAB — MAGNESIUM: Magnesium: 2.1 mg/dL (ref 1.7–2.4)

## 2023-09-23 MED ORDER — SALINE SPRAY 0.65 % NA SOLN: 2.0000 | NASAL | Status: DC | PRN

## 2023-09-23 MED ORDER — SALINE SPRAY 0.65 % NA SOLN
2.0000 | NASAL | Status: AC
  Administered 2023-09-23 (×2): 2 via NASAL

## 2023-09-23 MED ORDER — IPRATROPIUM BROMIDE 0.02 % IN SOLN
0.5000 mg | Freq: Four times a day (QID) | RESPIRATORY_TRACT | Status: DC
  Administered 2023-09-23 – 2023-09-24 (×5): 0.5 mg via RESPIRATORY_TRACT
  Filled 2023-09-23 (×5): qty 2.5

## 2023-09-23 MED ORDER — LOSARTAN POTASSIUM 50 MG PO TABS
50.0000 mg | ORAL_TABLET | Freq: Every day | ORAL | Status: DC
  Administered 2023-09-23 – 2023-09-24 (×2): 50 mg via ORAL
  Filled 2023-09-23 (×2): qty 1

## 2023-09-23 MED ORDER — LACTATED RINGERS IV SOLN: INTRAVENOUS | Status: DC

## 2023-09-23 MED ORDER — OSELTAMIVIR PHOSPHATE 75 MG PO CAPS
75.0000 mg | ORAL_CAPSULE | Freq: Two times a day (BID) | ORAL | Status: DC
  Administered 2023-09-23 – 2023-09-24 (×3): 75 mg via ORAL
  Filled 2023-09-23 (×3): qty 1

## 2023-09-23 NOTE — Plan of Care (Signed)
 Problem: Education: Goal: Knowledge of General Education information will improve Description: Including pain rating scale, medication(s)/side effects and non-pharmacologic comfort measures 09/23/2023 1934 by Christa See, RN Outcome: Progressing 09/23/2023 1932 by Christa See, RN Outcome: Progressing   Problem: Health Behavior/Discharge Planning: Goal: Ability to manage health-related needs will improve 09/23/2023 1934 by Christa See, RN Outcome: Progressing 09/23/2023 1932 by Christa See, RN Outcome: Progressing   Problem: Clinical Measurements: Goal: Ability to maintain clinical measurements within normal limits will improve 09/23/2023 1934 by Christa See, RN Outcome: Progressing 09/23/2023 1932 by Christa See, RN Outcome: Progressing Goal: Will remain free from infection 09/23/2023 1934 by Christa See, RN Outcome: Progressing 09/23/2023 1932 by Christa See, RN Outcome: Progressing Goal: Diagnostic test results will improve 09/23/2023 1934 by Christa See, RN Outcome: Progressing 09/23/2023 1932 by Christa See, RN Outcome: Progressing Goal: Respiratory complications will improve 09/23/2023 1934 by Christa See, RN Outcome: Progressing 09/23/2023 1932 by Christa See, RN Outcome: Progressing Goal: Cardiovascular complication will be avoided 09/23/2023 1934 by Christa See, RN Outcome: Progressing 09/23/2023 1932 by Christa See, RN Outcome: Progressing   Problem: Activity: Goal: Risk for activity intolerance will decrease 09/23/2023 1934 by Christa See, RN Outcome: Progressing 09/23/2023 1932 by Christa See, RN Outcome: Progressing   Problem: Nutrition: Goal: Adequate nutrition will be maintained 09/23/2023 1934 by Christa See, RN Outcome: Progressing 09/23/2023 1932 by Christa See, RN Outcome: Progressing   Problem: Coping: Goal: Level of anxiety will  decrease 09/23/2023 1934 by Christa See, RN Outcome: Progressing 09/23/2023 1932 by Christa See, RN Outcome: Progressing   Problem: Elimination: Goal: Will not experience complications related to bowel motility 09/23/2023 1934 by Christa See, RN Outcome: Progressing 09/23/2023 1932 by Christa See, RN Outcome: Progressing Goal: Will not experience complications related to urinary retention 09/23/2023 1934 by Christa See, RN Outcome: Progressing 09/23/2023 1932 by Christa See, RN Outcome: Progressing   Problem: Pain Managment: Goal: General experience of comfort will improve and/or be controlled 09/23/2023 1934 by Christa See, RN Outcome: Progressing 09/23/2023 1932 by Christa See, RN Outcome: Progressing   Problem: Safety: Goal: Ability to remain free from injury will improve 09/23/2023 1934 by Christa See, RN Outcome: Progressing 09/23/2023 1932 by Christa See, RN Outcome: Progressing   Problem: Skin Integrity: Goal: Risk for impaired skin integrity will decrease 09/23/2023 1934 by Christa See, RN Outcome: Progressing 09/23/2023 1932 by Christa See, RN Outcome: Progressing   Problem: Education: Goal: Ability to describe self-care measures that may prevent or decrease complications (Diabetes Survival Skills Education) will improve 09/23/2023 1934 by Christa See, RN Outcome: Progressing 09/23/2023 1932 by Christa See, RN Outcome: Progressing Goal: Individualized Educational Video(s) 09/23/2023 1934 by Christa See, RN Outcome: Progressing 09/23/2023 1932 by Christa See, RN Outcome: Progressing   Problem: Coping: Goal: Ability to adjust to condition or change in health will improve 09/23/2023 1934 by Christa See, RN Outcome: Progressing 09/23/2023 1932 by Christa See, RN Outcome: Progressing   Problem: Fluid Volume: Goal: Ability to maintain a balanced  intake and output will improve 09/23/2023 1934 by Christa See, RN Outcome: Progressing 09/23/2023 1932 by Christa See, RN Outcome: Progressing   Problem: Health Behavior/Discharge Planning: Goal: Ability to identify and utilize available resources and services will improve 09/23/2023 1934 by Christa See, RN Outcome: Progressing 09/23/2023 1932 by  Kristina Bertone, [employer], RN Outcome: Progressing Goal: Ability to manage health-related needs will improve 09/23/2023 1934 by Christa See, RN Outcome: Progressing 09/23/2023 1932 by Christa See, RN Outcome: Progressing   Problem: Metabolic: Goal: Ability to maintain appropriate glucose levels will improve 09/23/2023 1934 by Christa See, RN Outcome: Progressing 09/23/2023 1932 by Christa See, RN Outcome: Progressing   Problem: Nutritional: Goal: Maintenance of adequate nutrition will improve 09/23/2023 1934 by Christa See, RN Outcome: Progressing 09/23/2023 1932 by Christa See, RN Outcome: Progressing Goal: Progress toward achieving an optimal weight will improve 09/23/2023 1934 by Christa See, RN Outcome: Progressing 09/23/2023 1932 by Christa See, RN Outcome: Progressing   Problem: Skin Integrity: Goal: Risk for impaired skin integrity will decrease 09/23/2023 1934 by Christa See, RN Outcome: Progressing 09/23/2023 1932 by Christa See, RN Outcome: Progressing   Problem: Tissue Perfusion: Goal: Adequacy of tissue perfusion will improve 09/23/2023 1934 by Christa See, RN Outcome: Progressing 09/23/2023 1932 by Christa See, RN Outcome: Progressing

## 2023-09-23 NOTE — Progress Notes (Signed)
 Pt ambulated entire length of hallway x2, SaO2 on 3lpm with exertion was consistently > 90% and pt states he did not feel really SOB this time with walking. Pt back to room, O2 decreased to 2lpm [place].

## 2023-09-23 NOTE — Progress Notes (Addendum)
 PROGRESS NOTE   Bruce Fisher  ZOX:[id number] DOB: April 14, 1961 DOA: 09/21/2023 PCP: Mechele Claude, MD   Chief Complaint  Patient presents with   Shortness of Breath    Pt from home here for SOB and cough, states that he's been sick for a couple weeks sats 85% RA in Triage, placed on 4L in triage and came up to 90%. Does not use O2 @ baseline. Pt Denies chest pain. Has been seeing PCP but nothing has helped. Recently finished doses of prednisone.    Level of care: Med-Surg  Brief Admission History:  63 y.o. male with medical history significant of postnasal drip, hypertension, type 2 diabetes mellitus, hyperlipidemia who presents to the emergency department due to 6 month history of nonproductive cough, throat irritation from postnasal drip which worsened over the past 48 to 72 hours.  Patient states that she has been following up with his PCP and has been using inhalers and prednisone without much improvement.  He denies any history of COPD or asthma.  Patient states that he has an appointment with an allergist on 10/09/2023.  Patient denies chest pain.   In the Emergency Department, he was tachypneic, tachycardic, BP was 143/95.  O2 sat on arrival to the ED was 85% on room air and was provided with supplemental oxygen at 6 LPM with improvement in O2 sat to 92-94%.  Workup in the ED showed normal CBC and BMP except for blood glucose of 151, D-dimer 0.49, troponin x 2 was flat at 2, BNP 76.0 Chest x-ray showed emphysematous changes likely in the lungs.  No evidence of active pulmonary disease.  IV Solu-Medrol 125 mg x 1 was given, breathing treatment was provided.  Hospitalist was asked admit patient for further evaluation and management.   Assessment and Plan:  Working diagnosis of acute COPD exacerbation - pt claims he does not have COPD, never been told he had COPD - he does have a remote tobacco history - he presented with cough and wheezing, dyspnea - d-dimer was negative - pt says  symptoms barely improved with nebs - given persistent symptoms, check CT chest without contrast - continue current management for presumed COPD exacerbation  - continue supplemental oxygen with goal to wean oxygen to room air as able   Influenza A  - continue tamiflu 75 mg BID x 5d as ordered - continue supportive measures   Severe pansinusitis Chronic postnasal drainage Acute on chronic sinusitis - pt reports this has been persistent for past several months - added singulair at bedtime - continue flonase nasal spray daily --- increased dose to 2 sprays / nost daily  - continue loratadine daily  - continue IV steroids - continue antibiotics as ordered to cover for sinusitis - encouraged large volume saline nasal lavages to clear secretions/mucus and reduce inflammation - if medical therapy does not work he may require surgery - refer to ENT at discharge   Sinus tachycardia - resolved  - pt reports he has been dealing with this for several months as well - his EKG with findings of sinus tachycardia  - possibly secondary to nebs - he is on xopenex nebs at this time  - continue cardiac monitoring - repeat EKG in AM  - d dimer was negative so did not check for PE - obtain venous dopplers of legs to be sure no DVT  presen -- NEG for DVTt  Acute respiratory failure with hypoxia - for now we are working with diagnosis of COPD exacerbation - follow  CT chest study (pending)  - continue supplemental oxygen for now - wean oxygen as able  - ambulate halls today  Essential HTN  - added bisoprolol 5 mg daily  - continue amlodipine 10 mg  - add losartan 50 mg (he takes valsartan normally)  Controlled type 2 diabetes mellitus with steroid induced hyperglycemia - added some prandial coverage and continue SSI coverage and frequent CBG monitoring  CBG (last 3)  Recent Labs    09/22/23 2116 09/23/23 0733 09/23/23 1058  GLUCAP 150* 148* 126*  - controlled as evidenced by an A1c of  6.9%   DVT prophylaxis: enoxaparin  Code Status: Full  Family Communication:  Disposition: anticipating home    Consultants:   Procedures:   Antimicrobials:  Azithromycin 2/27-28 Doxycycline 2/28>> Ceftriaxone 2/28>>  Subjective: Pt reports he feels terrible, he has chronic shortness of breath, chronic postnasal drainage and intermittent tachycardia   Objective: Vitals:   09/22/23 2354 09/23/23 0327 09/23/23 0800 09/23/23 0849  BP:      Pulse:   70   Resp:   16   Temp:      TempSrc:      SpO2: 97% 97%  97%  Weight:      Height:        Intake/Output Summary (Last 24 hours) at 09/23/2023 1242 Last data filed at 09/23/2023 0400 Gross per 24 hour  Intake 940 ml  Output 600 ml  Net 340 ml   Filed Weights   09/22/23 1447  Weight: 108.5 kg   Examination:  General exam: Appears calm and comfortable  Respiratory system: Clear to auscultation. Respiratory effort normal. (He just received neb) Cardiovascular system: tachycardic rate; normal S1 & S2 heard. No JVD, murmurs, rubs, gallops or clicks. No pedal edema. Gastrointestinal system: Abdomen is nondistended, soft and nontender. No organomegaly or masses felt. Normal bowel sounds heard. Central nervous system: Alert and oriented. No focal neurological deficits. Extremities: Symmetric 5 x 5 power. Skin: No rashes, lesions or ulcers. Psychiatry: Judgement and insight appear normal. Mood & affect appropriate.   Data Reviewed: I have personally reviewed following labs and imaging studies  CBC: Recent Labs  Lab 09/20/23 0903 09/21/23 2145 09/22/23 0801  WBC WILL FOLLOW 9.5 10.2  NEUTROABS WILL FOLLOW  --   --   HGB WILL FOLLOW 15.7 14.9  HCT WILL FOLLOW 47.5 45.6  MCV WILL FOLLOW 88.6 89.8  PLT WILL FOLLOW 193 182    Basic Metabolic Panel: Recent Labs  Lab 09/20/23 0903 09/21/23 2145 09/22/23 0801 09/23/23 0456  NA 144 137 136 138  K 3.9 3.5 3.4* 3.7  CL 105 102 101 106  CO2 21 24 21* 24  GLUCOSE 126*  151* 202* 174*  BUN 16 20 17 20   CREATININE 1.05 1.15 1.01 0.78  CALCIUM 9.7 9.1 9.0 8.6*  MG  --   --  1.8 2.1  PHOS  --   --  2.6  --     CBG: Recent Labs  Lab 09/22/23 1226 09/22/23 1602 09/22/23 2116 09/23/23 0733 09/23/23 1058  GLUCAP 234* 139* 150* 148* 126*    Recent Results (from the past 240 hours)  MRSA Next Gen by PCR, Nasal     Status: None   Collection Time: 09/22/23  2:40 PM   Specimen: Nasal Mucosa; Nasal Swab  Result Value Ref Range Status   MRSA by PCR Next Gen NOT DETECTED NOT DETECTED Final    Comment: (NOTE) The GeneXpert MRSA Assay (FDA approved for NASAL  specimens only), is one component of a comprehensive MRSA colonization surveillance program. It is not intended to diagnose MRSA infection nor to guide or monitor treatment for MRSA infections. Test performance is not FDA approved in patients less than 59 years old. Performed at [facility], [address] [place], [place] [address]   Resp panel by RT-PCR (RSV, Flu A&B, Covid) Anterior Nasal Swab     Status: Abnormal   Collection Time: 09/22/23  3:50 PM   Specimen: Anterior Nasal Swab  Result Value Ref Range Status   SARS Coronavirus 2 by RT PCR NEGATIVE NEGATIVE Final    Comment: (NOTE) SARS-CoV-2 target nucleic acids are NOT DETECTED.  The SARS-CoV-2 RNA is generally detectable in upper respiratory specimens during the acute phase of infection. The lowest concentration of SARS-CoV-2 viral copies this assay can detect is 138 copies/mL. A negative result does not preclude SARS-Cov-2 infection and should not be used as the sole basis for treatment or other patient management decisions. A negative result may occur with  improper specimen collection/handling, submission of specimen other than nasopharyngeal swab, presence of viral mutation(s) within the areas targeted by this assay, and inadequate number of viral copies(<138 copies/mL). A negative result must be combined with clinical  observations, patient history, and epidemiological information. The expected result is Negative.  Fact Sheet for Patients:  BloggerCourse.com  Fact Sheet for Healthcare Providers:  SeriousBroker.it  This test is no t yet approved or cleared by the [place] FDA and  has been authorized for detection and/or diagnosis of SARS-CoV-2 by FDA under an Emergency Use Authorization (EUA). This EUA will remain  in effect (meaning this test can be used) for the duration of the COVID-19 declaration under Section 564(b)(1) of the Act, 21 U.S.C.section 360bbb-3(b)(1), unless the authorization is terminated  or revoked sooner.       Influenza A by PCR POSITIVE (A) NEGATIVE Final   Influenza B by PCR NEGATIVE NEGATIVE Final    Comment: (NOTE) The Xpert Xpress SARS-CoV-2/FLU/RSV plus assay is intended as an aid in the diagnosis of influenza from Nasopharyngeal swab specimens and should not be used as a sole basis for treatment. Nasal washings and aspirates are unacceptable for Xpert Xpress SARS-CoV-2/FLU/RSV testing.  Fact Sheet for Patients: BloggerCourse.com  Fact Sheet for Healthcare Providers: SeriousBroker.it  This test is not yet approved or cleared by the [place] FDA and has been authorized for detection and/or diagnosis of SARS-CoV-2 by FDA under an Emergency Use Authorization (EUA). This EUA will remain in effect (meaning this test can be used) for the duration of the COVID-19 declaration under Section 564(b)(1) of the Act, 21 U.S.C. section 360bbb-3(b)(1), unless the authorization is terminated or revoked.     Resp Syncytial Virus by PCR NEGATIVE NEGATIVE Final    Comment: (NOTE) Fact Sheet for Patients: BloggerCourse.com  Fact Sheet for Healthcare Providers: SeriousBroker.it  This test is not yet approved or cleared  by the [place] FDA and has been authorized for detection and/or diagnosis of SARS-CoV-2 by FDA under an Emergency Use Authorization (EUA). This EUA will remain in effect (meaning this test can be used) for the duration of the COVID-19 declaration under Section 564(b)(1) of the Act, 21 U.S.C. section 360bbb-3(b)(1), unless the authorization is terminated or revoked.  Performed at [facility], [address] [place], [place] [address]   Respiratory (~20 pathogens) panel by PCR     Status: Abnormal   Collection Time: 09/22/23  3:50 PM  Result Value Ref Range  Status   Adenovirus NOT DETECTED NOT DETECTED Final   Coronavirus 229E NOT DETECTED NOT DETECTED Final    Comment: (NOTE) The Coronavirus on the Respiratory Panel, DOES NOT test for the novel  Coronavirus (2019 nCoV)    Coronavirus HKU1 NOT DETECTED NOT DETECTED Final   Coronavirus NL63 NOT DETECTED NOT DETECTED Final   Coronavirus OC43 NOT DETECTED NOT DETECTED Final   Metapneumovirus NOT DETECTED NOT DETECTED Final   Rhinovirus / Enterovirus NOT DETECTED NOT DETECTED Final   Influenza A H1 2009 DETECTED (A) NOT DETECTED Final   Influenza B NOT DETECTED NOT DETECTED Final   Parainfluenza Virus 1 NOT DETECTED NOT DETECTED Final   Parainfluenza Virus 2 NOT DETECTED NOT DETECTED Final   Parainfluenza Virus 3 NOT DETECTED NOT DETECTED Final   Parainfluenza Virus 4 NOT DETECTED NOT DETECTED Final   Respiratory Syncytial Virus NOT DETECTED NOT DETECTED Final   Bordetella pertussis NOT DETECTED NOT DETECTED Final   Bordetella Parapertussis NOT DETECTED NOT DETECTED Final   Chlamydophila pneumoniae NOT DETECTED NOT DETECTED Final   Mycoplasma pneumoniae NOT DETECTED NOT DETECTED Final    Comment: Performed at [facility], [address], [place], [place] [address]     Radiology Studies: CT SINUS WO CONTRAST Result Date: 09/22/2023 CLINICAL DATA:  63 year old male with purulent postnasal drainage. EXAM: CT MAXILLOFACIAL  WITHOUT CONTRAST TECHNIQUE: Multidetector CT images of the paranasal sinuses were obtained using the standard protocol without intravenous contrast. RADIATION DOSE REDUCTION: This exam was performed according to the departmental dose-optimization program which includes automated exposure control, adjustment of the mA and/or kV according to patient size and/or use of iterative reconstruction technique. COMPARISON:  None Available. FINDINGS: Paranasal sinuses: Generalized bilateral paranasal sinus mucoperiosteal thickening. Superimposed bubbly opacity bilaterally. No significant hyperdense secretions within the sinuses. All sinuses affected. No retro maxillary or other regional fat stranding, no complicating features are identified. Right ostiomeatal unit: Opacified. Left ostiomeatal unit: Opacified. Nasal passages: Nasal septum is intact. Minor nasal septal deviation. Asymmetrically opacified olfactory recesses. Opacified bilateral superior meatus. Retained secretions in the posterior nasal cavity. Anatomy: Anterior ethmoidal artery position on coronal image 32 with pneumatization above both notches. Symmetric and intact olfactory grooves and fovea ethmoidalis, Keros II (4-61mm). Sellar sphenoid pneumatization pattern. No Onodi cell. No clinoid process pneumatization. Other: Orbital walls appear intact. Bilateral orbits soft tissues appears symmetric and normal. Visualized scalp soft tissues are within normal limits. Negative visible noncontrast deep soft tissue spaces of the face. Largely absent maxillary dentition. Bilateral tympanic cavities and mastoids appear clear. No acute osseous abnormality identified. Negative visible noncontrast brain parenchyma. Normal cerebral volume for age. Faint bilateral basal ganglia vascular calcifications. IMPRESSION: 1. Pansinusitis, appears to be acute on chronic. All paranasal sinuses affected and sinus drainage pathways opacified. But no complicating features are identified. 2.  Associated Rhinitis.  Nasal septum intact. 3. Otherwise negative noncontrast CT appearance of the visible face, brain. Electronically Signed   By: Odessa Fleming M.D.   On: 09/22/2023 15:15   US Venous Img Lower Bilateral (DVT) Result Date: 09/22/2023 CLINICAL DATA:  Bilateral edema EXAM: BILATERAL LOWER EXTREMITY VENOUS DOPPLER ULTRASOUND TECHNIQUE: Gray-scale sonography with compression, as well as color and duplex ultrasound, were performed to evaluate the deep venous system(s) from the level of the common femoral vein through the popliteal and proximal calf veins. COMPARISON:  None Available. FINDINGS: VENOUS Normal compressibility of the common femoral, superficial femoral, and popliteal veins, as well as the visualized calf veins. Visualized portions  of profunda femoral vein and great saphenous vein unremarkable. No filling defects to suggest DVT on grayscale or color Doppler imaging. Doppler waveforms show normal direction of venous flow, normal respiratory plasticity and response to augmentation. OTHER None. Limitations: none IMPRESSION: Negative. Electronically Signed   By: Corlis Leak M.D.   On: 09/22/2023 14:29   CT CHEST WO CONTRAST Result Date: 09/22/2023 CLINICAL DATA:  Shortness of breath and cough. EXAM: CT CHEST WITHOUT CONTRAST TECHNIQUE: Multidetector CT imaging of the chest was performed following the standard protocol without IV contrast. RADIATION DOSE REDUCTION: This exam was performed according to the departmental dose-optimization program which includes automated exposure control, adjustment of the mA and/or kV according to patient size and/or use of iterative reconstruction technique. COMPARISON:  Chest radiograph dated 09/21/2023 and cardiac CT dated 12/29/2014. FINDINGS: Evaluation of this exam is limited in the absence of intravenous contrast. Cardiovascular: There is no cardiomegaly or pericardial effusion. Mild atherosclerotic calcification of the thoracic aorta. Mildly dilated aortic  isthmus measuring 3.7 cm in diameter. The central pulmonary arteries appear unremarkable. Mediastinum/Nodes: No hilar or mediastinal adenopathy. The esophagus is grossly unremarkable. No mediastinal fluid collection. Lungs/Pleura: No focal consolidation, pleural effusion, or pneumothorax. Mild bronchial thickening may represent reactive airway disease/bronchitis. Tiny right lower lobe calcified granuloma. The central airways are patent. Upper Abdomen: No acute abnormality. Musculoskeletal: Degenerative changes of the spine. No acute osseous pathology. IMPRESSION: 1. No acute intrathoracic pathology. 2. Mild bronchial thickening may represent reactive airway disease/bronchitis. Electronically Signed   By: Elgie Collard M.D.   On: 09/22/2023 13:50   DG Chest Portable 1 View Result Date: 09/21/2023 CLINICAL DATA:  Shortness of breath.  Cough. EXAM: PORTABLE CHEST 1 VIEW COMPARISON:  06/11/2023 FINDINGS: Heart size and pulmonary vascularity are normal. Emphysematous changes in the lungs. No airspace disease or consolidation. No pleural effusion or pneumothorax. Mediastinal contours appear intact. Degenerative changes in the spine. IMPRESSION: Emphysematous changes likely in the lungs. No evidence of active pulmonary disease. Electronically Signed   By: Burman Nieves M.D.   On: 09/21/2023 23:26   Scheduled Meds:  amLODipine  10 mg Oral Daily   bisoprolol  5 mg Oral Daily   Chlorhexidine Gluconate Cloth  6 each Topical Q0600   dextromethorphan-guaiFENesin  1 tablet Oral BID   doxycycline  100 mg Oral Q12H   enoxaparin (LOVENOX) injection  40 mg Subcutaneous Q24H   fluticasone  2 spray Each Nare Daily   insulin aspart  0-20 Units Subcutaneous TID WC   insulin aspart  2 Units Subcutaneous TID WC   ipratropium  0.5 mg Nebulization Q6H   levalbuterol  0.63 mg Nebulization Q6H   loratadine  10 mg Oral Daily   methylPREDNISolone (SOLU-MEDROL) injection  40 mg Intravenous Q12H   montelukast  10 mg Oral  QHS   oseltamivir  75 mg Oral BID   pantoprazole  40 mg Oral Daily   rosuvastatin  20 mg Oral Daily   sodium chloride  2 spray Each Nare Q4H while awake   triamterene-hydrochlorothiazide  1 tablet Oral Daily   Continuous Infusions:  cefTRIAXone (ROCEPHIN)  IV Stopped (09/22/23 1541)   lactated ringers 50 mL/hr at 09/23/23 1119    LOS: 1 day   Time spent: 55 mins  Taylia Berber Laural Benes, MD How to contact the [facility] Attending or Consulting provider 7A - 7P or covering provider during after hours 7P -7A, for this patient?  Check the care team in [facility] and look for a) attending/consulting [facility] provider listed  and b) the [facility] team listed Log into [email or web address] to find provider on call.  Locate the [facility] provider you are looking for under [facility] and page to a number that you can be directly reached. If you still have difficulty reaching the provider, please page the [facility] (Director on Call) for the Hospitalists listed on amion for assistance.  09/23/2023, 12:42 PM

## 2023-09-23 NOTE — Progress Notes (Signed)
 Pt ambulated entire length of hallway (>1000 ft) without difficulty and with only minimal dyspnea noted. Pt on 3 lpm [place] and SaO2 remained >89% during exertion. Pt's RR peaked at 24/min but with rest quickly came down to 18-20. Continues with inspiratory and expiratory wheezes but moving air well with strong, occasionally productive cough. Pt used IS as directed. Discussed options for home nasal irrigation per MD and pt request. Answered pt's questions to his satisfaction.

## 2023-09-23 NOTE — Plan of Care (Signed)

## 2023-09-24 DIAGNOSIS — J441 Chronic obstructive pulmonary disease with (acute) exacerbation: Secondary | ICD-10-CM | POA: Diagnosis not present

## 2023-09-24 DIAGNOSIS — J324 Chronic pansinusitis: Secondary | ICD-10-CM

## 2023-09-24 DIAGNOSIS — J9601 Acute respiratory failure with hypoxia: Secondary | ICD-10-CM | POA: Diagnosis not present

## 2023-09-24 DIAGNOSIS — E1165 Type 2 diabetes mellitus with hyperglycemia: Secondary | ICD-10-CM | POA: Diagnosis not present

## 2023-09-24 LAB — GLUCOSE, CAPILLARY
Glucose-Capillary: 175 mg/dL — ABNORMAL HIGH (ref 70–99)
Glucose-Capillary: 163 mg/dL — ABNORMAL HIGH (ref 70–99)

## 2023-09-24 MED ORDER — OSELTAMIVIR PHOSPHATE 75 MG PO CAPS: 75.0000 mg | ORAL_CAPSULE | Freq: Two times a day (BID) | ORAL | 0 refills | Status: AC

## 2023-09-24 MED ORDER — ALBUTEROL SULFATE HFA 108 (90 BASE) MCG/ACT IN AERS: 2.0000 | INHALATION_SPRAY | RESPIRATORY_TRACT | 3 refills | Status: DC | PRN

## 2023-09-24 MED ORDER — PREDNISONE 20 MG PO TABS: 40.0000 mg | ORAL_TABLET | Freq: Every day | ORAL | 0 refills | Status: AC

## 2023-09-24 MED ORDER — DOXYCYCLINE HYCLATE 100 MG PO TABS: 100.0000 mg | ORAL_TABLET | Freq: Two times a day (BID) | ORAL | 0 refills | Status: AC

## 2023-09-24 MED ORDER — BISOPROLOL FUMARATE 5 MG PO TABS
2.5000 mg | ORAL_TABLET | Freq: Every day | ORAL | Status: DC
Start: 2023-09-24 — End: 2023-09-24
  Administered 2023-09-24: 2.5 mg via ORAL
  Filled 2023-09-24: qty 1

## 2023-09-24 NOTE — Discharge Instructions (Signed)
IMPORTANT INFORMATION: PAY CLOSE ATTENTION  ? ?PHYSICIAN DISCHARGE INSTRUCTIONS ? ?Follow with Primary care provider  Stacks, Warren, MD  and other consultants as instructed by your Hospitalist Physician ? ?SEEK MEDICAL CARE OR RETURN TO EMERGENCY ROOM IF SYMPTOMS COME BACK, WORSEN OR NEW PROBLEM DEVELOPS  ? ?Please note: ?You were cared for by a hospitalist during your hospital stay. Every effort will be made to forward records to your primary care provider.  You can request that your primary care provider send for your hospital records if they have not received them.  Once you are discharged, your primary care physician will handle any further medical issues. Please note that NO REFILLS for any discharge medications will be authorized once you are discharged, as it is imperative that you return to your primary care physician (or establish a relationship with a primary care physician if you do not have one) for your post hospital discharge needs so that they can reassess your need for medications and monitor your lab values. ? ?Please get a complete blood count and chemistry panel checked by your Primary MD at your next visit, and again as instructed by your Primary MD. ? ?Get Medicines reviewed and adjusted: ?Please take all your medications with you for your next visit with your Primary MD ? ?Laboratory/radiological data: ?Please request your Primary MD to go over all hospital tests and procedure/radiological results at the follow up, please ask your primary care provider to get all Hospital records sent to his/her office. ? ?In some cases, they will be blood work, cultures and biopsy results pending at the time of your discharge. Please request that your primary care provider follow up on these results. ? ?If you are diabetic, please bring your blood sugar readings with you to your follow up appointment with primary care.   ? ?Please call and make your follow up appointments as soon as possible.   ? ?Also Note  the following: ?If you experience worsening of your admission symptoms, develop shortness of breath, life threatening emergency, suicidal or homicidal thoughts you must seek medical attention immediately by calling 911 or calling your MD immediately  if symptoms less severe. ? ?You must read complete instructions/literature along with all the possible adverse reactions/side effects for all the Medicines you take and that have been prescribed to you. Take any new Medicines after you have completely understood and accpet all the possible adverse reactions/side effects.  ? ?Do not drive when taking Pain medications or sleeping medications (Benzodiazepines) ? ?Do not take more than prescribed Pain, Sleep and Anxiety Medications. It is not advisable to combine anxiety,sleep and pain medications without talking with your primary care practitioner ? ?Special Instructions: If you have smoked or chewed Tobacco  in the last 2 yrs please stop smoking, stop any regular Alcohol  and or any Recreational drug use. ? ?Wear Seat belts while driving.  Do not drive if taking any narcotic, mind altering or controlled substances or recreational drugs or alcohol.  ? ? ? ? ? ?

## 2023-09-24 NOTE — Progress Notes (Signed)
 Patient ambulated 576ft on room air, maintaining O2 sat 90-93% with mild SOB.

## 2023-09-24 NOTE — Progress Notes (Signed)
 After staff ambulated patient on room air, RRT placed patient back on O2 at 2L due to resting O2 sat being 88%. This nurse ambulated patient again approximately 259ft on room air and patient was able to maintain O2 sat 90-94% without SOB.

## 2023-09-24 NOTE — Discharge Summary (Signed)
 Physician Discharge Summary  Marshel Golubski JXB:[id number] DOB: 08/04/1960 DOA: 09/21/2023  PCP: Mechele Claude, MD  Admit date: 09/21/2023 Discharge date: 09/24/2023  Admitted From:  HOME  Disposition:  HOME   Recommendations for Outpatient Follow-up:  Follow up with PCP in 1 weeks Establish care with ENT (ambulatory referral made at discharge)   Home Health: N/A   Discharge Condition: STABLE   CODE STATUS: FULL  DIET: resume heart healthy    Brief Hospitalization Summary: Please see all hospital notes, images, labs for full details of the hospitalization. Admission provider HPI:  63 y.o. male with medical history significant of postnasal drip, hypertension, type 2 diabetes mellitus, hyperlipidemia who presents to the emergency department due to 6 month history of nonproductive cough, throat irritation from postnasal drip which worsened over the past 48 to 72 hours.  Patient states that she has been following up with his PCP and has been using inhalers and prednisone without much improvement.  He denies any history of COPD or asthma.  Patient states that he has an appointment with an allergist on 10/09/2023.  Patient denies chest pain.   In the Emergency Department, he was tachypneic, tachycardic, BP was 143/95.  O2 sat on arrival to the ED was 85% on room air and was provided with supplemental oxygen at 6 LPM with improvement in O2 sat to 92-94%.  Workup in the ED showed normal CBC and BMP except for blood glucose of 151, D-dimer 0.49, troponin x 2 was flat at 2, BNP 76.0 Chest x-ray showed emphysematous changes likely in the lungs.  No evidence of active pulmonary disease.  IV Solu-Medrol 125 mg x 1 was given, breathing treatment was provided.  Hospitalist was asked admit patient for further evaluation and management.  HOSPITAL COURSE BY PROBLEM LIST   MILD acute COPD exacerbation - pt reporting that he never been told he had COPD - he does have a remote tobacco history - he presented  with cough and wheezing, dyspnea - d-dimer was negative - given persistent symptoms, we checked CT chest without contrast with findings of reactive airways/bronchitis - treated with supplemental oxygen and then weaned oxygen to room air prior to discharge   Influenza A  - continue tamiflu 75 mg BID x 5d as ordered - continue supportive measures    Severe pansinusitis Chronic postnasal drainage Acute on chronic sinusitis - pt reports this has been persistent for past several months - added singulair at bedtime while in hospital - continue flonase nasal spray daily --- increased dose to 2 sprays / nost daily  - continue loratadine daily  - continue IV steroids in hospital, oral prednisone 40 mg daily x 5d at discharge - continue antibiotics as ordered to cover for sinusitis - encouraged large volume saline nasal lavages to clear secretions/mucus and reduce inflammation - if medical therapy does not work he may require surgery - referral made to ENT at discharge    Sinus tachycardia - resolved  - pt reports he has been dealing with this for several months as well - his EKG with findings of sinus tachycardia  - possibly secondary to nebs - he was treated with xopenex nebs   - stable cardiac monitoring - d dimer was negative - obtained venous dopplers of legs to be sure no DVT  present -- NEG for DVT   Acute respiratory failure with hypoxia - RESOLVED  - secondary to acute influenza A infection - weaned oxygen to room air   - ambulated halls with  good results   Essential HTN  - resume home meds at discharge  - follow up with PCP   Controlled type 2 diabetes mellitus with steroid induced hyperglycemia - resume home meds at discharge  CBG (last 3)  Recent Labs (last 2 labs)       Recent Labs    09/22/23 2116 09/23/23 0733 09/23/23 1058  GLUCAP 150* 148* 126*    - controlled as evidenced by an A1c of 6.9%    Discharge Diagnoses:  Principal Problem:   Acute exacerbation  of chronic obstructive pulmonary disease (COPD) (HCC) Active Problems:   Pansinusitis   acute on chronic sinusitis   Mixed hyperlipidemia   Essential hypertension   Acute respiratory failure with hypoxia (HCC)   History of postnasal drip   Prolonged QT interval   Type 2 diabetes mellitus with hyperglycemia [facility])  Discharge Instructions: Discharge Instructions     Ambulatory referral to ENT   Complete by: As directed    Hospital follow up      Allergies as of 09/24/2023   No Known Allergies      Medication List     STOP taking these medications    triamterene-hydrochlorothiazide 37.5-25 MG tablet Commonly known as: MAXZIDE-25       TAKE these medications    albuterol 108 (90 Base) MCG/ACT inhaler Commonly known as: VENTOLIN HFA Inhale 2 puffs into the lungs every 4 (four) hours as needed for wheezing or shortness of breath.   amLODipine 10 MG tablet Commonly known as: NORVASC Take 1 tablet (10 mg total) by mouth daily. for blood pressure   Co Q-10 100 MG Caps Take 100 mg by mouth daily.   diphenhydrAMINE 25 mg capsule Commonly known as: BENADRYL Take 1 capsule (25 mg total) by mouth at bedtime.   doxycycline 100 MG tablet Commonly known as: VIBRA-TABS Take 1 tablet (100 mg total) by mouth every 12 (twelve) hours for 5 days.   loratadine 10 MG tablet Commonly known as: CLARITIN Take 1 tablet (10 mg total) by mouth daily.   metFORMIN 500 MG tablet Commonly known as: GLUCOPHAGE Take 1 tablet (500 mg total) by mouth daily with breakfast.   multivitamin with minerals Tabs tablet Take 1 tablet by mouth daily.   oseltamivir 75 MG capsule Commonly known as: TAMIFLU Take 1 capsule (75 mg total) by mouth 2 (two) times daily for 4 days.   predniSONE 20 MG tablet Commonly known as: DELTASONE Take 2 tablets (40 mg total) by mouth daily with breakfast for 5 doses. Start taking on: September 25, 2023   rosuvastatin 20 MG tablet Commonly known as: CRESTOR Take 1  tablet (20 mg total) by mouth daily. for cholesterol.   triamcinolone 55 MCG/ACT Aero nasal inhaler Commonly known as: NASACORT Place 2 sprays into the nose at bedtime.   valsartan-hydrochlorothiazide 160-25 MG tablet Commonly known as: DIOVAN-HCT Take 1 tablet by mouth daily.   VITAMIN D PO Take 1 tablet by mouth daily.        Follow-up Information     Mechele Claude, MD Follow up.   Specialty: Family Medicine Contact information: [address] [place] [address] [phone]         [facility] ENT. Schedule an appointment as soon as possible for a visit in 2 week(s).   Why: Hospital Follow Up               No Known Allergies Allergies as of 09/24/2023   No Known Allergies  Medication List     STOP taking these medications    triamterene-hydrochlorothiazide 37.5-25 MG tablet Commonly known as: MAXZIDE-25       TAKE these medications    albuterol 108 (90 Base) MCG/ACT inhaler Commonly known as: VENTOLIN HFA Inhale 2 puffs into the lungs every 4 (four) hours as needed for wheezing or shortness of breath.   amLODipine 10 MG tablet Commonly known as: NORVASC Take 1 tablet (10 mg total) by mouth daily. for blood pressure   Co Q-10 100 MG Caps Take 100 mg by mouth daily.   diphenhydrAMINE 25 mg capsule Commonly known as: BENADRYL Take 1 capsule (25 mg total) by mouth at bedtime.   doxycycline 100 MG tablet Commonly known as: VIBRA-TABS Take 1 tablet (100 mg total) by mouth every 12 (twelve) hours for 5 days.   loratadine 10 MG tablet Commonly known as: CLARITIN Take 1 tablet (10 mg total) by mouth daily.   metFORMIN 500 MG tablet Commonly known as: GLUCOPHAGE Take 1 tablet (500 mg total) by mouth daily with breakfast.   multivitamin with minerals Tabs tablet Take 1 tablet by mouth daily.   oseltamivir 75 MG capsule Commonly known as: TAMIFLU Take 1 capsule (75 mg total) by mouth 2 (two) times daily for 4 days.   predniSONE 20  MG tablet Commonly known as: DELTASONE Take 2 tablets (40 mg total) by mouth daily with breakfast for 5 doses. Start taking on: September 25, 2023   rosuvastatin 20 MG tablet Commonly known as: CRESTOR Take 1 tablet (20 mg total) by mouth daily. for cholesterol.   triamcinolone 55 MCG/ACT Aero nasal inhaler Commonly known as: NASACORT Place 2 sprays into the nose at bedtime.   valsartan-hydrochlorothiazide 160-25 MG tablet Commonly known as: DIOVAN-HCT Take 1 tablet by mouth daily.   VITAMIN D PO Take 1 tablet by mouth daily.        Procedures/Studies: CT SINUS WO CONTRAST Result Date: 09/22/2023 CLINICAL DATA:  63 year old male with purulent postnasal drainage. EXAM: CT MAXILLOFACIAL WITHOUT CONTRAST TECHNIQUE: Multidetector CT images of the paranasal sinuses were obtained using the standard protocol without intravenous contrast. RADIATION DOSE REDUCTION: This exam was performed according to the departmental dose-optimization program which includes automated exposure control, adjustment of the mA and/or kV according to patient size and/or use of iterative reconstruction technique. COMPARISON:  None Available. FINDINGS: Paranasal sinuses: Generalized bilateral paranasal sinus mucoperiosteal thickening. Superimposed bubbly opacity bilaterally. No significant hyperdense secretions within the sinuses. All sinuses affected. No retro maxillary or other regional fat stranding, no complicating features are identified. Right ostiomeatal unit: Opacified. Left ostiomeatal unit: Opacified. Nasal passages: Nasal septum is intact. Minor nasal septal deviation. Asymmetrically opacified olfactory recesses. Opacified bilateral superior meatus. Retained secretions in the posterior nasal cavity. Anatomy: Anterior ethmoidal artery position on coronal image 32 with pneumatization above both notches. Symmetric and intact olfactory grooves and fovea ethmoidalis, Keros II (4-29mm). Sellar sphenoid pneumatization  pattern. No Onodi cell. No clinoid process pneumatization. Other: Orbital walls appear intact. Bilateral orbits soft tissues appears symmetric and normal. Visualized scalp soft tissues are within normal limits. Negative visible noncontrast deep soft tissue spaces of the face. Largely absent maxillary dentition. Bilateral tympanic cavities and mastoids appear clear. No acute osseous abnormality identified. Negative visible noncontrast brain parenchyma. Normal cerebral volume for age. Faint bilateral basal ganglia vascular calcifications. IMPRESSION: 1. Pansinusitis, appears to be acute on chronic. All paranasal sinuses affected and sinus drainage pathways opacified. But no complicating features are identified. 2. Associated Rhinitis.  Nasal septum intact. 3. Otherwise negative noncontrast CT appearance of the visible face, brain. Electronically Signed   By: Odessa Fleming M.D.   On: 09/22/2023 15:15   US Venous Img Lower Bilateral (DVT) Result Date: 09/22/2023 CLINICAL DATA:  Bilateral edema EXAM: BILATERAL LOWER EXTREMITY VENOUS DOPPLER ULTRASOUND TECHNIQUE: Gray-scale sonography with compression, as well as color and duplex ultrasound, were performed to evaluate the deep venous system(s) from the level of the common femoral vein through the popliteal and proximal calf veins. COMPARISON:  None Available. FINDINGS: VENOUS Normal compressibility of the common femoral, superficial femoral, and popliteal veins, as well as the visualized calf veins. Visualized portions of profunda femoral vein and great saphenous vein unremarkable. No filling defects to suggest DVT on grayscale or color Doppler imaging. Doppler waveforms show normal direction of venous flow, normal respiratory plasticity and response to augmentation. OTHER None. Limitations: none IMPRESSION: Negative. Electronically Signed   By: Corlis Leak M.D.   On: 09/22/2023 14:29   CT CHEST WO CONTRAST Result Date: 09/22/2023 CLINICAL DATA:  Shortness of breath and  cough. EXAM: CT CHEST WITHOUT CONTRAST TECHNIQUE: Multidetector CT imaging of the chest was performed following the standard protocol without IV contrast. RADIATION DOSE REDUCTION: This exam was performed according to the departmental dose-optimization program which includes automated exposure control, adjustment of the mA and/or kV according to patient size and/or use of iterative reconstruction technique. COMPARISON:  Chest radiograph dated 09/21/2023 and cardiac CT dated 12/29/2014. FINDINGS: Evaluation of this exam is limited in the absence of intravenous contrast. Cardiovascular: There is no cardiomegaly or pericardial effusion. Mild atherosclerotic calcification of the thoracic aorta. Mildly dilated aortic isthmus measuring 3.7 cm in diameter. The central pulmonary arteries appear unremarkable. Mediastinum/Nodes: No hilar or mediastinal adenopathy. The esophagus is grossly unremarkable. No mediastinal fluid collection. Lungs/Pleura: No focal consolidation, pleural effusion, or pneumothorax. Mild bronchial thickening may represent reactive airway disease/bronchitis. Tiny right lower lobe calcified granuloma. The central airways are patent. Upper Abdomen: No acute abnormality. Musculoskeletal: Degenerative changes of the spine. No acute osseous pathology. IMPRESSION: 1. No acute intrathoracic pathology. 2. Mild bronchial thickening may represent reactive airway disease/bronchitis. Electronically Signed   By: Elgie Collard M.D.   On: 09/22/2023 13:50   DG Chest Portable 1 View Result Date: 09/21/2023 CLINICAL DATA:  Shortness of breath.  Cough. EXAM: PORTABLE CHEST 1 VIEW COMPARISON:  06/11/2023 FINDINGS: Heart size and pulmonary vascularity are normal. Emphysematous changes in the lungs. No airspace disease or consolidation. No pleural effusion or pneumothorax. Mediastinal contours appear intact. Degenerative changes in the spine. IMPRESSION: Emphysematous changes likely in the lungs. No evidence of active  pulmonary disease. Electronically Signed   By: Burman Nieves M.D.   On: 09/21/2023 23:26     Subjective: Pt says his sinuses are draining better and he is breathing better and was able to walk the hallways twice yesterday and did well.  He is eager to follow up with ENT to have his sinuses evaluated.    Discharge Exam: Vitals:   09/24/23 0618 09/24/23 0900  BP: 107/66   Pulse: (!) 44   Resp: 16   Temp: 97.9 F (36.6 C)   SpO2: 98% 94%   Vitals:   09/23/23 2157 09/24/23 0155 09/24/23 0618 09/24/23 0900  BP: 125/70  107/66   Pulse:   (!) 44   Resp:   16   Temp: 97.7 F (36.5 C)  97.9 F (36.6 C)   TempSrc: Oral  Oral   SpO2: 95% 94%  98% 94%  Weight:      Height:       General: Pt is alert, awake, not in acute distress Cardiovascular: RRR, S1/S2 +, no rubs, no gallops Respiratory: CTA bilaterally, no wheezing, no rhonchi Abdominal: Soft, NT, ND, bowel sounds + Extremities: no edema, no cyanosis   The results of significant diagnostics from this hospitalization (including imaging, microbiology, ancillary and laboratory) are listed below for reference.     Microbiology: Recent Results (from the past 240 hours)  MRSA Next Gen by PCR, Nasal     Status: None   Collection Time: 09/22/23  2:40 PM   Specimen: Nasal Mucosa; Nasal Swab  Result Value Ref Range Status   MRSA by PCR Next Gen NOT DETECTED NOT DETECTED Final    Comment: (NOTE) The GeneXpert MRSA Assay (FDA approved for NASAL specimens only), is one component of a comprehensive MRSA colonization surveillance program. It is not intended to diagnose MRSA infection nor to guide or monitor treatment for MRSA infections. Test performance is not FDA approved in patients less than 46 years old. Performed at [facility], [address], [place], [place] [address]   Resp panel by RT-PCR (RSV, Flu A&B, Covid) Anterior Nasal Swab     Status: Abnormal   Collection Time: 09/22/23  3:50 PM   Specimen: Anterior Nasal Swab   Result Value Ref Range Status   SARS Coronavirus 2 by RT PCR NEGATIVE NEGATIVE Final    Comment: (NOTE) SARS-CoV-2 target nucleic acids are NOT DETECTED.  The SARS-CoV-2 RNA is generally detectable in upper respiratory specimens during the acute phase of infection. The lowest concentration of SARS-CoV-2 viral copies this assay can detect is 138 copies/mL. A negative result does not preclude SARS-Cov-2 infection and should not be used as the sole basis for treatment or other patient management decisions. A negative result may occur with  improper specimen collection/handling, submission of specimen other than nasopharyngeal swab, presence of viral mutation(s) within the areas targeted by this assay, and inadequate number of viral copies(<138 copies/mL). A negative result must be combined with clinical observations, patient history, and epidemiological information. The expected result is Negative.  Fact Sheet for Patients:  BloggerCourse.com  Fact Sheet for Healthcare Providers:  SeriousBroker.it  This test is no t yet approved or cleared by the [place] FDA and  has been authorized for detection and/or diagnosis of SARS-CoV-2 by FDA under an Emergency Use Authorization (EUA). This EUA will remain  in effect (meaning this test can be used) for the duration of the COVID-19 declaration under Section 564(b)(1) of the Act, 21 U.S.C.section 360bbb-3(b)(1), unless the authorization is terminated  or revoked sooner.       Influenza A by PCR POSITIVE (A) NEGATIVE Final   Influenza B by PCR NEGATIVE NEGATIVE Final    Comment: (NOTE) The Xpert Xpress SARS-CoV-2/FLU/RSV plus assay is intended as an aid in the diagnosis of influenza from Nasopharyngeal swab specimens and should not be used as a sole basis for treatment. Nasal washings and aspirates are unacceptable for Xpert Xpress SARS-CoV-2/FLU/RSV testing.  Fact Sheet for  Patients: BloggerCourse.com  Fact Sheet for Healthcare Providers: SeriousBroker.it  This test is not yet approved or cleared by the [place] FDA and has been authorized for detection and/or diagnosis of SARS-CoV-2 by FDA under an Emergency Use Authorization (EUA). This EUA will remain in effect (meaning this test can be used) for the duration of the COVID-19 declaration under Section 564(b)(1) of the Act, 21 U.S.C. section  360bbb-3(b)(1), unless the authorization is terminated or revoked.     Resp Syncytial Virus by PCR NEGATIVE NEGATIVE Final    Comment: (NOTE) Fact Sheet for Patients: BloggerCourse.com  Fact Sheet for Healthcare Providers: SeriousBroker.it  This test is not yet approved or cleared by the [place] FDA and has been authorized for detection and/or diagnosis of SARS-CoV-2 by FDA under an Emergency Use Authorization (EUA). This EUA will remain in effect (meaning this test can be used) for the duration of the COVID-19 declaration under Section 564(b)(1) of the Act, 21 U.S.C. section 360bbb-3(b)(1), unless the authorization is terminated or revoked.  Performed at [facility], [address], [place], [place] [address]   Respiratory (~20 pathogens) panel by PCR     Status: Abnormal   Collection Time: 09/22/23  3:50 PM  Result Value Ref Range Status   Adenovirus NOT DETECTED NOT DETECTED Final   Coronavirus 229E NOT DETECTED NOT DETECTED Final    Comment: (NOTE) The Coronavirus on the Respiratory Panel, DOES NOT test for the novel  Coronavirus (2019 nCoV)    Coronavirus HKU1 NOT DETECTED NOT DETECTED Final   Coronavirus NL63 NOT DETECTED NOT DETECTED Final   Coronavirus OC43 NOT DETECTED NOT DETECTED Final   Metapneumovirus NOT DETECTED NOT DETECTED Final   Rhinovirus / Enterovirus NOT DETECTED NOT DETECTED Final   Influenza A H1 2009 DETECTED (A) NOT  DETECTED Final   Influenza B NOT DETECTED NOT DETECTED Final   Parainfluenza Virus 1 NOT DETECTED NOT DETECTED Final   Parainfluenza Virus 2 NOT DETECTED NOT DETECTED Final   Parainfluenza Virus 3 NOT DETECTED NOT DETECTED Final   Parainfluenza Virus 4 NOT DETECTED NOT DETECTED Final   Respiratory Syncytial Virus NOT DETECTED NOT DETECTED Final   Bordetella pertussis NOT DETECTED NOT DETECTED Final   Bordetella Parapertussis NOT DETECTED NOT DETECTED Final   Chlamydophila pneumoniae NOT DETECTED NOT DETECTED Final   Mycoplasma pneumoniae NOT DETECTED NOT DETECTED Final    Comment: Performed at [facility], [address], [place], [place] [address]     Labs: BNP (last 3 results) Recent Labs    06/11/23 2234 09/21/23 2145  BNP 63.0 76.0   Basic Metabolic Panel: Recent Labs  Lab 09/20/23 0903 09/21/23 2145 09/22/23 0801 09/23/23 0456  NA 144 137 136 138  K 3.9 3.5 3.4* 3.7  CL 105 102 101 106  CO2 21 24 21* 24  GLUCOSE 126* 151* 202* 174*  BUN 16 20 17 20   CREATININE 1.05 1.15 1.01 0.78  CALCIUM 9.7 9.1 9.0 8.6*  MG  --   --  1.8 2.1  PHOS  --   --  2.6  --    Liver Function Tests: Recent Labs  Lab 09/20/23 0903 09/22/23 0801  AST 19 26  ALT 19 24  ALKPHOS 60 46  BILITOT 0.4 0.7  PROT 7.5 8.0  ALBUMIN 4.4 4.2   No results for input(s): "LIPASE", "AMYLASE" in the last 168 hours. No results for input(s): "AMMONIA" in the last 168 hours. CBC: Recent Labs  Lab 09/20/23 0903 09/21/23 2145 09/22/23 0801  WBC WILL FOLLOW 9.5 10.2  NEUTROABS WILL FOLLOW  --   --   HGB WILL FOLLOW 15.7 14.9  HCT WILL FOLLOW 47.5 45.6  MCV WILL FOLLOW 88.6 89.8  PLT WILL FOLLOW 193 182   Cardiac Enzymes: No results for input(s): "CKTOTAL", "CKMB", "CKMBINDEX", "TROPONINI" in the last 168 hours. BNP: Invalid input(s): "POCBNP" CBG: Recent Labs  Lab 09/23/23  1610 09/23/23 1058 09/23/23 1805 09/23/23 2127 09/24/23 0737  GLUCAP 148* 126* 220* 126* 175*    D-Dimer Recent Labs    09/21/23 2145  DDIMER 0.49   Hgb A1c Recent Labs    09/22/23 0801  HGBA1C 6.9*   Lipid Profile No results for input(s): "CHOL", "HDL", "LDLCALC", "TRIG", "CHOLHDL", "LDLDIRECT" in the last 72 hours. Thyroid function studies No results for input(s): "TSH", "T4TOTAL", "T3FREE", "THYROIDAB" in the last 72 hours.  Invalid input(s): "FREET3" Anemia work up No results for input(s): "VITAMINB12", "FOLATE", "FERRITIN", "TIBC", "IRON", "RETICCTPCT" in the last 72 hours. Urinalysis    Component Value Date/Time   COLORURINE YELLOW 08/03/2020 1939   APPEARANCEUR CLEAR 08/03/2020 1939   LABSPEC 1.032 (H) 08/03/2020 1939   PHURINE 5.0 08/03/2020 1939   GLUCOSEU >=500 (A) 08/03/2020 1939   HGBUR NEGATIVE 08/03/2020 1939   BILIRUBINUR NEGATIVE 08/03/2020 1939   BILIRUBINUR negative 08/19/2013 1721   KETONESUR 5 (A) 08/03/2020 1939   PROTEINUR 100 (A) 08/03/2020 1939   UROBILINOGEN negative 08/19/2013 1721   NITRITE NEGATIVE 08/03/2020 1939   LEUKOCYTESUR NEGATIVE 08/03/2020 1939   Sepsis Labs Recent Labs  Lab 09/20/23 0903 09/21/23 2145 09/22/23 0801  WBC WILL FOLLOW 9.5 10.2   Microbiology Recent Results (from the past 240 hours)  MRSA Next Gen by PCR, Nasal     Status: None   Collection Time: 09/22/23  2:40 PM   Specimen: Nasal Mucosa; Nasal Swab  Result Value Ref Range Status   MRSA by PCR Next Gen NOT DETECTED NOT DETECTED Final    Comment: (NOTE) The GeneXpert MRSA Assay (FDA approved for NASAL specimens only), is one component of a comprehensive MRSA colonization surveillance program. It is not intended to diagnose MRSA infection nor to guide or monitor treatment for MRSA infections. Test performance is not FDA approved in patients less than 70 years old. Performed at [facility], [address] [place], [place] [address]   Resp panel by RT-PCR (RSV, Flu A&B, Covid) Anterior Nasal Swab     Status: Abnormal   Collection Time: 09/22/23   3:50 PM   Specimen: Anterior Nasal Swab  Result Value Ref Range Status   SARS Coronavirus 2 by RT PCR NEGATIVE NEGATIVE Final    Comment: (NOTE) SARS-CoV-2 target nucleic acids are NOT DETECTED.  The SARS-CoV-2 RNA is generally detectable in upper respiratory specimens during the acute phase of infection. The lowest concentration of SARS-CoV-2 viral copies this assay can detect is 138 copies/mL. A negative result does not preclude SARS-Cov-2 infection and should not be used as the sole basis for treatment or other patient management decisions. A negative result may occur with  improper specimen collection/handling, submission of specimen other than nasopharyngeal swab, presence of viral mutation(s) within the areas targeted by this assay, and inadequate number of viral copies(<138 copies/mL). A negative result must be combined with clinical observations, patient history, and epidemiological information. The expected result is Negative.  Fact Sheet for Patients:  BloggerCourse.com  Fact Sheet for Healthcare Providers:  SeriousBroker.it  This test is no t yet approved or cleared by the [place] FDA and  has been authorized for detection and/or diagnosis of SARS-CoV-2 by FDA under an Emergency Use Authorization (EUA). This EUA will remain  in effect (meaning this test can be used) for the duration of the COVID-19 declaration under Section 564(b)(1) of the Act, 21 U.S.C.section 360bbb-3(b)(1), unless the authorization is terminated  or revoked sooner.       Influenza A by  PCR POSITIVE (A) NEGATIVE Final   Influenza B by PCR NEGATIVE NEGATIVE Final    Comment: (NOTE) The Xpert Xpress SARS-CoV-2/FLU/RSV plus assay is intended as an aid in the diagnosis of influenza from Nasopharyngeal swab specimens and should not be used as a sole basis for treatment. Nasal washings and aspirates are unacceptable for Xpert Xpress  SARS-CoV-2/FLU/RSV testing.  Fact Sheet for Patients: BloggerCourse.com  Fact Sheet for Healthcare Providers: SeriousBroker.it  This test is not yet approved or cleared by the [place] FDA and has been authorized for detection and/or diagnosis of SARS-CoV-2 by FDA under an Emergency Use Authorization (EUA). This EUA will remain in effect (meaning this test can be used) for the duration of the COVID-19 declaration under Section 564(b)(1) of the Act, 21 U.S.C. section 360bbb-3(b)(1), unless the authorization is terminated or revoked.     Resp Syncytial Virus by PCR NEGATIVE NEGATIVE Final    Comment: (NOTE) Fact Sheet for Patients: BloggerCourse.com  Fact Sheet for Healthcare Providers: SeriousBroker.it  This test is not yet approved or cleared by the [place] FDA and has been authorized for detection and/or diagnosis of SARS-CoV-2 by FDA under an Emergency Use Authorization (EUA). This EUA will remain in effect (meaning this test can be used) for the duration of the COVID-19 declaration under Section 564(b)(1) of the Act, 21 U.S.C. section 360bbb-3(b)(1), unless the authorization is terminated or revoked.  Performed at [facility], [address], [place], [place] [address]   Respiratory (~20 pathogens) panel by PCR     Status: Abnormal   Collection Time: 09/22/23  3:50 PM  Result Value Ref Range Status   Adenovirus NOT DETECTED NOT DETECTED Final   Coronavirus 229E NOT DETECTED NOT DETECTED Final    Comment: (NOTE) The Coronavirus on the Respiratory Panel, DOES NOT test for the novel  Coronavirus (2019 nCoV)    Coronavirus HKU1 NOT DETECTED NOT DETECTED Final   Coronavirus NL63 NOT DETECTED NOT DETECTED Final   Coronavirus OC43 NOT DETECTED NOT DETECTED Final   Metapneumovirus NOT DETECTED NOT DETECTED Final   Rhinovirus / Enterovirus NOT DETECTED NOT DETECTED  Final   Influenza A H1 2009 DETECTED (A) NOT DETECTED Final   Influenza B NOT DETECTED NOT DETECTED Final   Parainfluenza Virus 1 NOT DETECTED NOT DETECTED Final   Parainfluenza Virus 2 NOT DETECTED NOT DETECTED Final   Parainfluenza Virus 3 NOT DETECTED NOT DETECTED Final   Parainfluenza Virus 4 NOT DETECTED NOT DETECTED Final   Respiratory Syncytial Virus NOT DETECTED NOT DETECTED Final   Bordetella pertussis NOT DETECTED NOT DETECTED Final   Bordetella Parapertussis NOT DETECTED NOT DETECTED Final   Chlamydophila pneumoniae NOT DETECTED NOT DETECTED Final   Mycoplasma pneumoniae NOT DETECTED NOT DETECTED Final    Comment: Performed at [facility], [address] [place], [place] [address]   Time coordinating discharge:  42 mins   SIGNED:  Standley Dakins, MD  [facility] 09/24/2023, 9:51 AM How to contact the [facility] Attending or Consulting provider 7A - 7P or covering provider during after hours 7P -7A, for this patient?  Check the care team in [facility] and look for a) attending/consulting [facility] provider listed and b) the [facility] team listed Log into [email or web address] and use [place]'s universal password to access. If you do not have the password, please contact the hospital operator. Locate the [facility] provider you are looking for under [facility] and page to a number that you can be directly reached. If you  still have difficulty reaching the provider, please page the [facility] (Director on Call) for the Hospitalists listed on amion for assistance.

## 2023-09-25 ENCOUNTER — Telehealth: Payer: Self-pay | Admitting: *Deleted

## 2023-09-25 LAB — CBC WITH DIFFERENTIAL/PLATELET

## 2023-09-25 LAB — CMP14+EGFR
ALT: 19 IU/L (ref 0–44)
AST: 19 IU/L (ref 0–40)
Albumin: 4.4 g/dL (ref 3.8–4.9)
Alkaline Phosphatase: 60 IU/L (ref 44–121)
BUN/Creatinine Ratio: 15 (ref 10–24)
BUN: 16 mg/dL (ref 8–27)
Bilirubin Total: 0.4 mg/dL (ref 0.0–1.2)
CO2: 21 mmol/L (ref 20–29)
Calcium: 9.7 mg/dL (ref 8.6–10.2)
Chloride: 105 mmol/L (ref 96–106)
Creatinine, Ser: 1.05 mg/dL (ref 0.76–1.27)
Globulin, Total: 3.1 g/dL (ref 1.5–4.5)
Glucose: 126 mg/dL — ABNORMAL HIGH (ref 70–99)
Potassium: 3.9 mmol/L (ref 3.5–5.2)
Sodium: 144 mmol/L (ref 134–144)
Total Protein: 7.5 g/dL (ref 6.0–8.5)
eGFR: 81 mL/min/{1.73_m2} (ref >59)

## 2023-09-25 NOTE — Transitions of Care (Post Inpatient/ED Visit) (Signed)
   09/25/2023  Name: Bruce Fisher MRN: [id number] DOB: 1960-11-15  Today's TOC FU Call Status: Today's TOC FU Call Status:: Unsuccessful Call (1st Attempt) Unsuccessful Call (1st Attempt) Date: 09/25/23  Attempted to reach the patient regarding the most recent Inpatient/ED visit.  Follow Up Plan: Additional outreach attempts will be made to reach the patient to complete the Transitions of Care (Post Inpatient/ED visit) call.   Irving Shows [facility], BSN RN Care Manager/ Transition of Care [place]/ [facility] [phone]

## 2023-09-26 ENCOUNTER — Telehealth: Payer: Self-pay | Admitting: *Deleted

## 2023-09-26 ENCOUNTER — Encounter: Payer: Self-pay | Admitting: Family Medicine

## 2023-09-26 NOTE — Progress Notes (Signed)
 CMP normal. CBC not collected. Recommend patient follow up with PCP if symptoms continue.

## 2023-09-26 NOTE — Transitions of Care (Post Inpatient/ED Visit) (Signed)
 09/26/2023  Name: Bruce Fisher MRN: [id number] DOB: [birth year]  Today's TOC FU Call Status: Today's TOC FU Call Status:: Successful TOC FU Call Completed TOC FU Call Complete Date: 09/26/23 Patient's Name and Date of Birth confirmed.  Transition Care Management Follow-up Telephone Call Date of Discharge: 09/24/23 [facility] Type of Discharge: Inpatient Admission Primary Inpatient Discharge Diagnosis:: Acute CHF exacerbation How have you been since you were released from the hospital?: Better ("I am doing fine; not out running any races, but taking my time and getting back to normal.  I do not want weekly calls, I don't like all these phone calls.  I will call you back if something comes up and I have questions or concerns") Any questions or concerns?: No  Items Reviewed: Did you receive and understand the discharge instructions provided?: Yes (thoroughly reviewed with patient who verbalizes good understanding of same) Medications obtained,verified, and reconciled?: Partial Review Completed Reason for Partial Mediation Review: Patient declined: states he does not like talking long on the phone, and he understands his medications; he did review newly prescribed medications with me and there were no concerns/ discrepancies identified; confirmed he self-manages medications and he denies questions/ concerns around all medications today Any new allergies since your discharge?: No Dietary orders reviewed?: Yes Type of Diet Ordered:: Heart Healthy/ Low salt Do you have support at home?: Yes People in Home: spouse Name of Support/Comfort Primary Source: Reports independent in self-care activities; spouse assists as/ if needed/ indicated  Medications Reviewed Today: Medications Reviewed Today     Reviewed by Michaela Corner, RN (Registered Nurse) on 09/26/23 at 1545  Med List Status: <None>   Medication Order Taking? Sig Documenting Provider Last Dose Status  Informant  albuterol (VENTOLIN HFA) 108 (90 Base) MCG/ACT inhaler [id number]  Inhale 2 puffs into the lungs every 4 (four) hours as needed for wheezing or shortness of breath. Johnson, Clanford L, MD  Active   amLODipine (NORVASC) 10 MG tablet [id number]  Take 1 tablet (10 mg total) by mouth daily. for blood pressure Mechele Claude, MD  Active Self  Cholecalciferol (VITAMIN D PO) [id number]  Take 1 tablet by mouth daily. [provider]  Active Self  Coenzyme Q10 (CO Q-10) 100 MG CAPS [id number]  Take 100 mg by mouth daily. Mechele Claude, MD  Active Self  diphenhydrAMINE (BENADRYL) 25 mg capsule [id number]  Take 1 capsule (25 mg total) by mouth at bedtime. Dettinger, Elige Radon, MD  Active Self  doxycycline (VIBRA-TABS) 100 MG tablet [id number] Yes Take 1 tablet (100 mg total) by mouth every 12 (twelve) hours for 5 days. Johnson, Clanford L, MD Taking Active   loratadine (CLARITIN) 10 MG tablet [id number]  Take 1 tablet (10 mg total) by mouth daily. Dettinger, Elige Radon, MD  Active Self  metFORMIN (GLUCOPHAGE) 500 MG tablet [id number]  Take 1 tablet (500 mg total) by mouth daily with breakfast. Mechele Claude, MD  Active Self  Multiple Vitamin (MULTIVITAMIN WITH MINERALS) TABS tablet [id number]  Take 1 tablet by mouth daily. [provider]  Active Self  oseltamivir (TAMIFLU) 75 MG capsule [id number] Yes Take 1 capsule (75 mg total) by mouth 2 (two) times daily for 4 days. Cleora Fleet, MD Taking Active   predniSONE (DELTASONE) 20 MG tablet [id number] Yes Take 2 tablets (40 mg total) by mouth daily with breakfast for 5 doses. Cleora Fleet, MD Taking Active   rosuvastatin (CRESTOR) 20 MG tablet [id number]  Take 1 tablet (20 mg  total) by mouth daily. for cholesterol. Mechele Claude, MD  Active Self  triamcinolone (NASACORT) 55 MCG/ACT AERO nasal inhaler [id number]  Place 2 sprays into the nose at bedtime. Dettinger, Elige Radon, MD  Active Self  valsartan-hydrochlorothiazide (DIOVAN-HCT)  160-25 MG tablet [id number]  Take 1 tablet by mouth daily. Mechele Claude, MD  Active Self           Home Care and Equipment/Supplies: Were Home Health Services Ordered?: No Any new equipment or medical supplies ordered?: No  Functional Questionnaire: Do you need assistance with bathing/showering or dressing?: No Do you need assistance with meal preparation?: No Do you need assistance with eating?: No Do you have difficulty maintaining continence: No Do you need assistance with getting out of bed/getting out of a chair/moving?: No Do you have difficulty managing or taking your medications?: No  Follow up appointments reviewed: PCP Follow-up appointment confirmed?: No (patient declined: he stated that he has an appointment with his "new allergist" on 10/09/23-- and he is not going to be "running back and forth to as bunch of doctor appointments evbery week;" adamantly declines scheduling hospital follow up with PCP) MD Provider Line Number:[phone] Given: No (verified well-established with current PCP  and confirmed has contact information for PCP) Specialist Hospital Follow-up appointment confirmed?: Yes Date of Specialist follow-up appointment?: 10/09/23 Follow-Up Specialty Provider:: New Allergist provider Do you need transportation to your follow-up appointment?: No Do you understand care options if your condition(s) worsen?: Yes-patient verbalized understanding  SDOH Interventions Today    Flowsheet Row Most Recent Value  SDOH Interventions   Food Insecurity Interventions Intervention Not Indicated  Housing Interventions Intervention Not Indicated  Transportation Interventions Intervention Not Indicated  [drives self]  Utilities Interventions Intervention Not Indicated      Interventions Today    Flowsheet Row Most Recent Value  Chronic Disease   Chronic disease during today's visit Other, Chronic Obstructive Pulmonary Disease (COPD)  General Interventions   General  Interventions Discussed/Reviewed General Interventions Discussed, Doctor Visits, Durable Medical Equipment (DME)  Doctor Visits Discussed/Reviewed Doctor Visits Discussed, PCP, Specialist  Durable Medical Equipment (DME) Other  [confirmed not currently requiring/ using assistive devices for ambulation]  PCP/Specialist Visits Compliance with follow-up visit  Education Interventions   Education Provided Provided Education  Provided Verbal Education On Other  [Provided education around benefit of conservative post-hospital discharge activity,  need to pace activity without over-doing,  purpose of newly prescribed medications post-hospital discharge]  Nutrition Interventions   Nutrition Discussed/Reviewed Nutrition Discussed  Pharmacy Interventions   Pharmacy Dicussed/Reviewed Pharmacy Topics Discussed      TOC Interventions Today    Flowsheet Row Most Recent Value  TOC Interventions   TOC Interventions Discussed/Reviewed TOC Interventions Discussed  [Patient declines need for ongoing/ further care management outreach,  declines enrollment in 30-day TOC program,  provided my direct contact information should questions/ concerns/ needs arise post-TOC call]      Total time spent from review to signing of note/ including any care coordination interventions:  30 minutes  Pls call/ message for questions,  Caryl Pina, RN, BSN, Media planner  Transitions of Care  [facility] - [facility] [phone]: direct office

## 2023-10-09 ENCOUNTER — Ambulatory Visit: Payer: Medicaid Other | Admitting: Internal Medicine

## 2023-10-23 ENCOUNTER — Inpatient Hospital Stay (HOSPITAL_COMMUNITY)
Admission: EM | Admit: 2023-10-23 | Discharge: 2023-10-24 | DRG: 190 | Disposition: A | Discharge status: Home / Self Care | Attending: Internal Medicine | Admitting: Internal Medicine

## 2023-10-23 ENCOUNTER — Other Ambulatory Visit: Payer: Self-pay

## 2023-10-23 ENCOUNTER — Encounter (HOSPITAL_COMMUNITY): Payer: Self-pay | Admitting: Emergency Medicine

## 2023-10-23 ENCOUNTER — Emergency Department (HOSPITAL_COMMUNITY)

## 2023-10-23 DIAGNOSIS — I471 Supraventricular tachycardia, unspecified: Secondary | ICD-10-CM | POA: Diagnosis not present

## 2023-10-23 DIAGNOSIS — I440 Atrioventricular block, first degree: Secondary | ICD-10-CM | POA: Diagnosis not present

## 2023-10-23 DIAGNOSIS — I119 Hypertensive heart disease without heart failure: Secondary | ICD-10-CM | POA: Diagnosis present

## 2023-10-23 DIAGNOSIS — Z87891 Personal history of nicotine dependence: Secondary | ICD-10-CM | POA: Diagnosis not present

## 2023-10-23 DIAGNOSIS — Z7984 Long term (current) use of oral hypoglycemic drugs: Secondary | ICD-10-CM | POA: Diagnosis not present

## 2023-10-23 DIAGNOSIS — Z79899 Other long term (current) drug therapy: Secondary | ICD-10-CM | POA: Diagnosis not present

## 2023-10-23 DIAGNOSIS — E119 Type 2 diabetes mellitus without complications: Secondary | ICD-10-CM

## 2023-10-23 DIAGNOSIS — I10 Essential (primary) hypertension: Secondary | ICD-10-CM | POA: Diagnosis present

## 2023-10-23 DIAGNOSIS — D72829 Elevated white blood cell count, unspecified: Secondary | ICD-10-CM | POA: Diagnosis not present

## 2023-10-23 DIAGNOSIS — E669 Obesity, unspecified: Secondary | ICD-10-CM | POA: Diagnosis not present

## 2023-10-23 DIAGNOSIS — Z833 Family history of diabetes mellitus: Secondary | ICD-10-CM

## 2023-10-23 DIAGNOSIS — J441 Chronic obstructive pulmonary disease with (acute) exacerbation: Principal | ICD-10-CM | POA: Diagnosis present

## 2023-10-23 DIAGNOSIS — J9601 Acute respiratory failure with hypoxia: Secondary | ICD-10-CM | POA: Diagnosis not present

## 2023-10-23 DIAGNOSIS — Z8709 Personal history of other diseases of the respiratory system: Secondary | ICD-10-CM | POA: Diagnosis not present

## 2023-10-23 DIAGNOSIS — Z87442 Personal history of urinary calculi: Secondary | ICD-10-CM | POA: Diagnosis not present

## 2023-10-23 DIAGNOSIS — Z8489 Family history of other specified conditions: Secondary | ICD-10-CM | POA: Diagnosis not present

## 2023-10-23 DIAGNOSIS — Z6829 Body mass index (BMI) 29.0-29.9, adult: Secondary | ICD-10-CM

## 2023-10-23 DIAGNOSIS — Z7951 Long term (current) use of inhaled steroids: Secondary | ICD-10-CM | POA: Diagnosis not present

## 2023-10-23 DIAGNOSIS — Z1152 Encounter for screening for COVID-19: Secondary | ICD-10-CM

## 2023-10-23 DIAGNOSIS — Z803 Family history of malignant neoplasm of breast: Secondary | ICD-10-CM | POA: Diagnosis not present

## 2023-10-23 DIAGNOSIS — R0600 Dyspnea, unspecified: Secondary | ICD-10-CM | POA: Diagnosis not present

## 2023-10-23 DIAGNOSIS — E785 Hyperlipidemia, unspecified: Secondary | ICD-10-CM | POA: Diagnosis present

## 2023-10-23 DIAGNOSIS — Z8249 Family history of ischemic heart disease and other diseases of the circulatory system: Secondary | ICD-10-CM

## 2023-10-23 DIAGNOSIS — Z7982 Long term (current) use of aspirin: Secondary | ICD-10-CM | POA: Diagnosis not present

## 2023-10-23 LAB — CBC WITH DIFFERENTIAL/PLATELET
Abs Immature Granulocytes: 0.04 10*3/uL (ref 0.00–0.07)
Basophils Absolute: 0.1 10*3/uL (ref 0.0–0.1)
Basophils Relative: 1 %
Eosinophils Absolute: 0.4 10*3/uL (ref 0.0–0.5)
Eosinophils Relative: 4 %
HCT: 46.5 % (ref 39.0–52.0)
Hemoglobin: 15.1 g/dL (ref 13.0–17.0)
Immature Granulocytes: 0 %
Lymphocytes Relative: 7 %
Lymphs Abs: 0.8 10*3/uL (ref 0.7–4.0)
MCH: 29.3 pg (ref 26.0–34.0)
MCHC: 32.5 g/dL (ref 30.0–36.0)
MCV: 90.1 fL (ref 80.0–100.0)
Monocytes Absolute: 0.8 10*3/uL (ref 0.1–1.0)
Monocytes Relative: 7 %
Neutro Abs: 9 10*3/uL — ABNORMAL HIGH (ref 1.7–7.7)
Neutrophils Relative %: 81 %
Platelets: 185 10*3/uL (ref 150–400)
RBC: 5.16 MIL/uL (ref 4.22–5.81)
RDW: 14.1 % (ref 11.5–15.5)
WBC: 11 10*3/uL — ABNORMAL HIGH (ref 4.0–10.5)
nRBC: 0 % (ref 0.0–0.2)

## 2023-10-23 LAB — RESP PANEL BY RT-PCR (RSV, FLU A&B, COVID)  RVPGX2
Influenza A by PCR: NEGATIVE
Influenza B by PCR: NEGATIVE
Resp Syncytial Virus by PCR: NEGATIVE
SARS Coronavirus 2 by RT PCR: NEGATIVE

## 2023-10-23 LAB — CBC
HCT: 43.8 % (ref 39.0–52.0)
Hemoglobin: 14.5 g/dL (ref 13.0–17.0)
MCH: 29.5 pg (ref 26.0–34.0)
MCHC: 33.1 g/dL (ref 30.0–36.0)
MCV: 89.2 fL (ref 80.0–100.0)
Platelets: 185 10*3/uL (ref 150–400)
RBC: 4.91 MIL/uL (ref 4.22–5.81)
RDW: 14 % (ref 11.5–15.5)
WBC: 10.5 10*3/uL (ref 4.0–10.5)
nRBC: 0 % (ref 0.0–0.2)

## 2023-10-23 LAB — BASIC METABOLIC PANEL WITH GFR
Anion gap: 10 (ref 5–15)
Anion gap: 12 (ref 5–15)
BUN: 20 mg/dL (ref 6–20)
BUN: 19 mg/dL (ref 6–20)
CO2: 26 mmol/L (ref 22–32)
CO2: 22 mmol/L (ref 22–32)
Calcium: 9.6 mg/dL (ref 8.9–10.3)
Calcium: 9.3 mg/dL (ref 8.9–10.3)
Chloride: 103 mmol/L (ref 98–111)
Chloride: 105 mmol/L (ref 98–111)
Creatinine, Ser: 1.11 mg/dL (ref 0.61–1.24)
Creatinine, Ser: 1.04 mg/dL (ref 0.61–1.24)
GFR, Estimated: >60 mL/min (ref >60)
GFR, Estimated: >60 mL/min (ref >60)
Glucose, Bld: 158 mg/dL — ABNORMAL HIGH (ref 70–99)
Glucose, Bld: 173 mg/dL — ABNORMAL HIGH (ref 70–99)
Potassium: 3.7 mmol/L (ref 3.5–5.1)
Potassium: 3.4 mmol/L — ABNORMAL LOW (ref 3.5–5.1)
Sodium: 139 mmol/L (ref 135–145)
Sodium: 139 mmol/L (ref 135–145)

## 2023-10-23 LAB — TROPONIN I (HIGH SENSITIVITY)
Troponin I (High Sensitivity): 4 ng/L (ref <18)
Troponin I (High Sensitivity): 5 ng/L (ref <18)

## 2023-10-23 LAB — BRAIN NATRIURETIC PEPTIDE: B Natriuretic Peptide: 25 pg/mL (ref 0.0–100.0)

## 2023-10-23 LAB — GLUCOSE, CAPILLARY
Glucose-Capillary: 243 mg/dL — ABNORMAL HIGH (ref 70–99)
Glucose-Capillary: 225 mg/dL — ABNORMAL HIGH (ref 70–99)
Glucose-Capillary: 245 mg/dL — ABNORMAL HIGH (ref 70–99)

## 2023-10-23 MED ORDER — SODIUM CHLORIDE 0.9 % IV SOLN: INTRAVENOUS | Status: AC

## 2023-10-23 MED ORDER — ROSUVASTATIN CALCIUM 20 MG PO TABS
20.0000 mg | ORAL_TABLET | Freq: Every day | ORAL | Status: DC
  Administered 2023-10-23 – 2023-10-24 (×2): 20 mg via ORAL
  Filled 2023-10-23 (×2): qty 1

## 2023-10-23 MED ORDER — METHYLPREDNISOLONE SODIUM SUCC 125 MG IJ SOLR
125.0000 mg | Freq: Once | INTRAMUSCULAR | Status: AC
  Administered 2023-10-23: 125 mg via INTRAVENOUS
  Filled 2023-10-23: qty 2

## 2023-10-23 MED ORDER — IPRATROPIUM-ALBUTEROL 0.5-2.5 (3) MG/3ML IN SOLN
3.0000 mL | Freq: Once | RESPIRATORY_TRACT | Status: AC
Start: 2023-10-23 — End: 2023-10-23
  Administered 2023-10-23: 3 mL via RESPIRATORY_TRACT
  Filled 2023-10-23: qty 3

## 2023-10-23 MED ORDER — INSULIN ASPART 100 UNIT/ML IJ SOLN
0.0000 [IU] | Freq: Every day | INTRAMUSCULAR | Status: DC
  Administered 2023-10-23: 2 [IU] via SUBCUTANEOUS

## 2023-10-23 MED ORDER — AMLODIPINE BESYLATE 5 MG PO TABS
10.0000 mg | ORAL_TABLET | Freq: Every day | ORAL | Status: DC
  Administered 2023-10-23 – 2023-10-24 (×2): 10 mg via ORAL
  Filled 2023-10-23 (×2): qty 2

## 2023-10-23 MED ORDER — BUDESONIDE 0.5 MG/2ML IN SUSP
0.5000 mg | Freq: Two times a day (BID) | RESPIRATORY_TRACT | Status: DC
  Administered 2023-10-23 – 2023-10-24 (×2): 0.5 mg via RESPIRATORY_TRACT
  Filled 2023-10-23 (×2): qty 2

## 2023-10-23 MED ORDER — FLUTICASONE PROPIONATE 50 MCG/ACT NA SUSP
2.0000 | Freq: Every day | NASAL | Status: DC
  Administered 2023-10-23 – 2023-10-24 (×2): 2 via NASAL
  Filled 2023-10-23: qty 16

## 2023-10-23 MED ORDER — CO Q-10 100 MG PO CAPS: 100.0000 mg | ORAL_CAPSULE | Freq: Every day | ORAL | Status: DC

## 2023-10-23 MED ORDER — METHYLPREDNISOLONE SODIUM SUCC 40 MG IJ SOLR
40.0000 mg | Freq: Two times a day (BID) | INTRAMUSCULAR | Status: AC
  Administered 2023-10-23 (×2): 40 mg via INTRAVENOUS
  Filled 2023-10-23 (×2): qty 1

## 2023-10-23 MED ORDER — ARFORMOTEROL TARTRATE 15 MCG/2ML IN NEBU
15.0000 ug | INHALATION_SOLUTION | Freq: Two times a day (BID) | RESPIRATORY_TRACT | Status: DC
  Administered 2023-10-23 – 2023-10-24 (×2): 15 ug via RESPIRATORY_TRACT
  Filled 2023-10-23 (×2): qty 2

## 2023-10-23 MED ORDER — PREDNISONE 20 MG PO TABS
40.0000 mg | ORAL_TABLET | Freq: Every day | ORAL | Status: DC
  Administered 2023-10-24: 40 mg via ORAL
  Filled 2023-10-23: qty 2

## 2023-10-23 MED ORDER — ENOXAPARIN SODIUM 40 MG/0.4ML IJ SOSY
40.0000 mg | PREFILLED_SYRINGE | INTRAMUSCULAR | Status: DC
  Administered 2023-10-23 – 2023-10-24 (×2): 40 mg via SUBCUTANEOUS
  Filled 2023-10-23 (×2): qty 0.4

## 2023-10-23 MED ORDER — TRAZODONE HCL 50 MG PO TABS: 25.0000 mg | ORAL_TABLET | Freq: Every evening | ORAL | Status: DC | PRN

## 2023-10-23 MED ORDER — VITAMIN D 25 MCG (1000 UNIT) PO TABS
1000.0000 [IU] | ORAL_TABLET | Freq: Every day | ORAL | Status: DC
  Administered 2023-10-23 – 2023-10-24 (×2): 1000 [IU] via ORAL
  Filled 2023-10-23 (×2): qty 1

## 2023-10-23 MED ORDER — CEFTRIAXONE SODIUM 1 G IJ SOLR
1.0000 g | INTRAMUSCULAR | Status: DC
  Administered 2023-10-23 – 2023-10-24 (×2): 1 g via INTRAVENOUS
  Filled 2023-10-23 (×2): qty 10

## 2023-10-23 MED ORDER — VALSARTAN-HYDROCHLOROTHIAZIDE 160-25 MG PO TABS: 1.0000 | ORAL_TABLET | Freq: Every day | ORAL | Status: DC

## 2023-10-23 MED ORDER — HYDROCOD POLI-CHLORPHE POLI ER 10-8 MG/5ML PO SUER: 5.0000 mL | Freq: Two times a day (BID) | ORAL | Status: DC | PRN

## 2023-10-23 MED ORDER — INSULIN GLARGINE-YFGN 100 UNIT/ML ~~LOC~~ SOLN
5.0000 [IU] | Freq: Every day | SUBCUTANEOUS | Status: DC
  Administered 2023-10-23: 5 [IU] via SUBCUTANEOUS
  Filled 2023-10-23 (×2): qty 0.05

## 2023-10-23 MED ORDER — LORATADINE 10 MG PO TABS
10.0000 mg | ORAL_TABLET | Freq: Every day | ORAL | Status: DC
  Administered 2023-10-23 – 2023-10-24 (×2): 10 mg via ORAL
  Filled 2023-10-23 (×2): qty 1

## 2023-10-23 MED ORDER — IPRATROPIUM-ALBUTEROL 0.5-2.5 (3) MG/3ML IN SOLN
3.0000 mL | Freq: Once | RESPIRATORY_TRACT | Status: AC
  Administered 2023-10-23: 3 mL via RESPIRATORY_TRACT
  Filled 2023-10-23: qty 3

## 2023-10-23 MED ORDER — ACETAMINOPHEN 325 MG PO TABS
650.0000 mg | ORAL_TABLET | Freq: Four times a day (QID) | ORAL | Status: DC | PRN
  Administered 2023-10-23: 650 mg via ORAL
  Filled 2023-10-23: qty 2

## 2023-10-23 MED ORDER — HYDROCHLOROTHIAZIDE 25 MG PO TABS
25.0000 mg | ORAL_TABLET | Freq: Every day | ORAL | Status: DC
Start: 2023-10-23 — End: 2023-10-24
  Administered 2023-10-23 – 2023-10-24 (×2): 25 mg via ORAL
  Filled 2023-10-23 (×2): qty 1

## 2023-10-23 MED ORDER — IPRATROPIUM-ALBUTEROL 0.5-2.5 (3) MG/3ML IN SOLN
3.0000 mL | Freq: Four times a day (QID) | RESPIRATORY_TRACT | Status: DC
  Administered 2023-10-23 – 2023-10-24 (×7): 3 mL via RESPIRATORY_TRACT
  Filled 2023-10-23 (×7): qty 3

## 2023-10-23 MED ORDER — DM-GUAIFENESIN ER 30-600 MG PO TB12
1.0000 | ORAL_TABLET | Freq: Two times a day (BID) | ORAL | Status: DC
Start: 2023-10-23 — End: 2023-10-24
  Administered 2023-10-23 – 2023-10-24 (×3): 1 via ORAL
  Filled 2023-10-23 (×2): qty 1

## 2023-10-23 MED ORDER — MAGNESIUM HYDROXIDE 400 MG/5ML PO SUSP: 30.0000 mL | Freq: Every day | ORAL | Status: DC | PRN

## 2023-10-23 MED ORDER — INSULIN ASPART 100 UNIT/ML IJ SOLN
0.0000 [IU] | Freq: Three times a day (TID) | INTRAMUSCULAR | Status: DC
  Administered 2023-10-23 (×2): 5 [IU] via SUBCUTANEOUS
  Administered 2023-10-24 (×2): 3 [IU] via SUBCUTANEOUS

## 2023-10-23 MED ORDER — METOCLOPRAMIDE HCL 5 MG/ML IJ SOLN: 10.0000 mg | Freq: Four times a day (QID) | INTRAMUSCULAR | Status: DC | PRN

## 2023-10-23 MED ORDER — ACETAMINOPHEN 650 MG RE SUPP: 650.0000 mg | Freq: Four times a day (QID) | RECTAL | Status: DC | PRN

## 2023-10-23 MED ORDER — ADULT MULTIVITAMIN W/MINERALS CH
1.0000 | ORAL_TABLET | Freq: Every day | ORAL | Status: DC
Start: 2023-10-23 — End: 2023-10-24
  Administered 2023-10-23 – 2023-10-24 (×2): 1 via ORAL
  Filled 2023-10-23 (×2): qty 1

## 2023-10-23 MED ORDER — IRBESARTAN 150 MG PO TABS
150.0000 mg | ORAL_TABLET | Freq: Every day | ORAL | Status: DC
  Administered 2023-10-23 – 2023-10-24 (×2): 150 mg via ORAL
  Filled 2023-10-23 (×2): qty 1

## 2023-10-23 MED ORDER — GUAIFENESIN ER 600 MG PO TB12
600.0000 mg | ORAL_TABLET | Freq: Two times a day (BID) | ORAL | Status: DC
  Filled 2023-10-23: qty 1

## 2023-10-23 NOTE — H&P (Signed)
 [place]   PATIENT NAME: Bruce Fisher    MR#:  [id number]  DATE OF BIRTH:  [birth year]  DATE OF ADMISSION:  10/23/2023  PRIMARY CARE PHYSICIAN: Mechele Claude, MD   Patient is coming from: Home  REQUESTING/REFERRING PHYSICIAN: Soledad Gerlach, MD  CHIEF COMPLAINT:   Chief Complaint  Patient presents with   Shortness of Breath    HISTORY OF PRESENT ILLNESS:  Bruce Fisher is a 63 y.o. African-American male with medical history significant for obesity, type 2 diabetes mellitus, hypertension, dyslipidemia and urolithiasis, who presented to the emergency room with acute onset of worsening dyspnea with associated nonproductive cough and wheezing for the last couple of days.  He has been having chest pain only with excessive cough.  He denies any fever or chills.  No nausea or vomiting or abdominal pain.  No dysuria, oliguria or hematuria or flank pain.  He has used her albuterol inhaler without relief.  ED Course: Upon presentation to the emergency room, BP was 178/97 heart rate 115 respiratory rate 24 and pulse oximetry 87% on room air and 93% - 96% on 3 L of O2 by nasal cannula.  BMP revealed blood glucose of 158.  High sensitive troponin I was 4 and later 5.  BNP was 25.  CBC showed leukocytosis of 11 with neutrophilia.  Respiratory panel came back negative.    EKG as reviewed by me : EKG showed sinus tachycardia with a rate of 118 with prolonged PR interval with biatrial enlargement and Q waves anteroseptally. Imaging: Portable chest x-ray showed no acute cardiopulmonary disease.  The patient was given DuoNebs twice and 125 mg of IV Solu-Medrol..  The patient will be admitted to a medical telemetry bed for further evaluation and management. PAST MEDICAL HISTORY:   Past Medical History:  Diagnosis Date   COPD (chronic obstructive pulmonary disease) (HCC)    Diabetes mellitus without complication (HCC)    Hyperlipidemia    Hypertension    Kidney stones    Rectal spasm     Renal disorder    kidney stones    PAST SURGICAL HISTORY:   Past Surgical History:  Procedure Laterality Date   SPINE SURGERY     lower back    SOCIAL HISTORY:   Social History   Tobacco Use   Smoking status: Former    Current packs/day: 0.00    Types: Cigarettes    Quit date: 08/19/1993    Years since quitting: 30.1   Smokeless tobacco: Never  Substance Use Topics   Alcohol use: No    Alcohol/week: 0.0 standard drinks of alcohol    FAMILY HISTORY:   Family History  Problem Relation Age of Onset   Diabetes Mother    Heart failure Mother    Cancer Father        Asbestos   Cancer Sister        breast   Heart disease Sister        valve    DRUG ALLERGIES:  No Known Allergies  REVIEW OF SYSTEMS:   ROS As per history of present illness. All pertinent systems were reviewed above. Constitutional, HEENT, cardiovascular, respiratory, GI, GU, musculoskeletal, neuro, psychiatric, endocrine, integumentary and hematologic systems were reviewed and are otherwise negative/unremarkable except for positive findings mentioned above in the HPI.   MEDICATIONS AT HOME:   Prior to Admission medications   Medication Sig Start Date End Date Taking? Authorizing Provider  albuterol (VENTOLIN HFA) 108 (90 Base) MCG/ACT inhaler  Inhale 2 puffs into the lungs every 4 (four) hours as needed for wheezing or shortness of breath. 09/24/23   Johnson, Clanford L, MD  amLODipine (NORVASC) 10 MG tablet Take 1 tablet (10 mg total) by mouth daily. for blood pressure 03/16/23   Mechele Claude, MD  Cholecalciferol (VITAMIN D PO) Take 1 tablet by mouth daily.    [provider]  Coenzyme Q10 (CO Q-10) 100 MG CAPS Take 100 mg by mouth daily. 06/22/22   Mechele Claude, MD  diphenhydrAMINE (BENADRYL) 25 mg capsule Take 1 capsule (25 mg total) by mouth at bedtime. 08/24/23   Dettinger, Elige Radon, MD  loratadine (CLARITIN) 10 MG tablet Take 1 tablet (10 mg total) by mouth daily. 08/24/23    Dettinger, Elige Radon, MD  metFORMIN (GLUCOPHAGE) 500 MG tablet Take 1 tablet (500 mg total) by mouth daily with breakfast. 03/16/23 03/10/24  Mechele Claude, MD  Multiple Vitamin (MULTIVITAMIN WITH MINERALS) TABS tablet Take 1 tablet by mouth daily.    [provider]  rosuvastatin (CRESTOR) 20 MG tablet Take 1 tablet (20 mg total) by mouth daily. for cholesterol. 03/16/23   Mechele Claude, MD  triamcinolone (NASACORT) 55 MCG/ACT AERO nasal inhaler Place 2 sprays into the nose at bedtime. 08/24/23   Dettinger, Elige Radon, MD  valsartan-hydrochlorothiazide (DIOVAN-HCT) 160-25 MG tablet Take 1 tablet by mouth daily. 09/07/23   Mechele Claude, MD      VITAL SIGNS:  Blood pressure 136/80, pulse (!) 107, temperature 98.3 F (36.8 C), temperature source Oral, resp. rate (!) 21, height 6\' 3"  (1.905 m), weight 108.8 kg, SpO2 97%.  PHYSICAL EXAMINATION:  Physical Exam  GENERAL:  63 y.o.-year-old African-American male patient lying in the bed with mild respiratory distress with conversational dyspnea. EYES: Pupils equal, round, reactive to light and accommodation. No scleral icterus. Extraocular muscles intact.  HEENT: Head atraumatic, normocephalic. Oropharynx and nasopharynx clear.  NECK:  Supple, no jugular venous distention. No thyroid enlargement, no tenderness.  LUNGS: Mild residual diffuse expiratory wheezes with tight expiratory airflow and harsh vesicular breathing. No use of accessory muscles of respiration.  CARDIOVASCULAR: Regular rate and rhythm, S1, S2 normal. No murmurs, rubs, or gallops.  ABDOMEN: Soft, nondistended, nontender. Bowel sounds present. No organomegaly or mass.  EXTREMITIES: No pedal edema, cyanosis, or clubbing.  NEUROLOGIC: Cranial nerves II through XII are intact. Muscle strength 5/5 in all extremities. Sensation intact. Gait not checked.  PSYCHIATRIC: The patient is alert and oriented x 3.  Normal affect and good eye contact. SKIN: No obvious rash, lesion, or ulcer.    LABORATORY PANEL:   CBC Recent Labs  Lab 10/23/23 0341  WBC 10.5  HGB 14.5  HCT 43.8  PLT 185   ------------------------------------------------------------------------------------------------------------------  Chemistries  Recent Labs  Lab 10/23/23 0341  NA 139  K 3.4*  CL 105  CO2 22  GLUCOSE 173*  BUN 19  CREATININE 1.04  CALCIUM 9.3   ------------------------------------------------------------------------------------------------------------------  Cardiac Enzymes No results for input(s): "TROPONINI" in the last 168 hours. ------------------------------------------------------------------------------------------------------------------  RADIOLOGY:  DG Chest Port 1 View Result Date: 10/23/2023 CLINICAL DATA:  Dyspnea EXAM: PORTABLE CHEST 1 VIEW COMPARISON:  None Available. FINDINGS: The heart size and mediastinal contours are within normal limits. Both lungs are clear. The visualized skeletal structures are unremarkable. IMPRESSION: No active disease. Electronically Signed   By: Helyn Numbers M.D.   On: 10/23/2023 02:07      IMPRESSION AND PLAN:  Assessment and Plan: * COPD exacerbation (HCC) - The patient will be  admitted to a medically monitored bed. - We will place the patient IV steroid therapy with IV Solu-Medrol as well as nebulized bronchodilator therapy with duonebs q.i.d. and q.4 hours p.r.n.Marland Kitchen - Mucolytic therapy will be provided with Mucinex and antibiotic therapy with IV Rocephin. - O2 protocol will be followed.   Acute respiratory failure with hypoxia (HCC) - This is clearly secondary to #1. - Management as above. - O2 protocol will be followed as mentioned above.  Type 2 diabetes mellitus without complication, without long-term current use of insulin (HCC) - The patient will be placed on supplemental coverage with NovoLog. - We will hold off metformin.  Essential hypertension - We will continue antihypertensive  therapy.  Dyslipidemia - We will continue statin therapy.   DVT prophylaxis: Lovenox. Advanced Care Planning:  Code Status: full code. Family Communication:  The plan of care was discussed in details with the patient (and family). I answered all questions. The patient agreed to proceed with the above mentioned plan. Further management will depend upon hospital course. Disposition Plan: Back to previous home environment Consults called: none. All the records are reviewed and case discussed with ED provider.  Status is: Inpatient  At the time of the admission, it appears that the appropriate admission status for this patient is inpatient.  This is judged to be reasonable and necessary in order to provide the required intensity of service to ensure the patient's safety given the presenting symptoms, physical exam findings and initial radiographic and laboratory data in the context of comorbid conditions.  The patient requires inpatient status due to high intensity of service, high risk of further deterioration and high frequency of surveillance required.  I certify that at the time of admission, it is my clinical judgment that the patient will require inpatient hospital care extending more than 2 midnights.                            Dispo: The patient is from: Home              Anticipated d/c is to: Home              Patient currently is not medically stable to d/c.              Difficult to place patient: No  Hannah Beat M.D on 10/23/2023 at 6:27 AM  [facility]   From 7 PM-7 AM, contact night-coverage [email or web address]  CC: Primary care physician; Mechele Claude, MD

## 2023-10-23 NOTE — Progress Notes (Signed)
 Patient seen and examined; admitted after midnight secondary to shortness of breath and increased wheezing.  Patient did not use oxygen at baseline and has ended requiring 2-3 L nasal cannula supplementation to maintain saturation above 90%.  Please refer to H&P written by [facility] for further info/details on admission.  Plan: -Continue steroids, bronchodilator management, starting the use of Pulmicort/Brovana, mucolytic's and flutter valve -Wean off oxygen supplementation as tolerated -Follow clinical response.   Vassie Loll MD [phone]

## 2023-10-23 NOTE — ED Triage Notes (Signed)
 Pt c/o sob, cough and chest pain.

## 2023-10-23 NOTE — Plan of Care (Signed)
  Problem: Education: Goal: Knowledge of General Education information will improve Description: Including pain rating scale, medication(s)/side effects and non-pharmacologic comfort measures Outcome: Progressing   Problem: Health Behavior/Discharge Planning: Goal: Ability to manage health-related needs will improve Outcome: Progressing   Problem: Clinical Measurements: Goal: Ability to maintain clinical measurements within normal limits will improve Outcome: Progressing Goal: Will remain free from infection Outcome: Progressing Goal: Diagnostic test results will improve Outcome: Progressing Goal: Respiratory complications will improve Outcome: Progressing Goal: Cardiovascular complication will be avoided Outcome: Progressing   Problem: Activity: Goal: Risk for activity intolerance will decrease Outcome: Progressing   Problem: Nutrition: Goal: Adequate nutrition will be maintained Outcome: Progressing   Problem: Coping: Goal: Level of anxiety will decrease Outcome: Progressing   Problem: Elimination: Goal: Will not experience complications related to bowel motility Outcome: Progressing Goal: Will not experience complications related to urinary retention Outcome: Progressing   Problem: Pain Managment: Goal: General experience of comfort will improve and/or be controlled Outcome: Progressing   Problem: Safety: Goal: Ability to remain free from injury will improve Outcome: Progressing   Problem: Skin Integrity: Goal: Risk for impaired skin integrity will decrease Outcome: Progressing   Problem: Education: Goal: Knowledge of disease or condition will improve Outcome: Progressing Goal: Knowledge of the prescribed therapeutic regimen will improve Outcome: Progressing Goal: Individualized Educational Video(s) Outcome: Progressing   Problem: Activity: Goal: Ability to tolerate increased activity will improve Outcome: Progressing Goal: Will verbalize the  importance of balancing activity with adequate rest periods Outcome: Progressing   Problem: Respiratory: Goal: Ability to maintain a clear airway will improve Outcome: Progressing Goal: Levels of oxygenation will improve Outcome: Progressing Goal: Ability to maintain adequate ventilation will improve Outcome: Progressing   Problem: Education: Goal: Ability to describe self-care measures that may prevent or decrease complications (Diabetes Survival Skills Education) will improve Outcome: Progressing Goal: Individualized Educational Video(s) Outcome: Progressing   Problem: Coping: Goal: Ability to adjust to condition or change in health will improve Outcome: Progressing   Problem: Metabolic: Goal: Ability to maintain appropriate glucose levels will improve Outcome: Progressing   Problem: Health Behavior/Discharge Planning: Goal: Ability to identify and utilize available resources and services will improve Outcome: Progressing Goal: Ability to manage health-related needs will improve Outcome: Progressing   Problem: Fluid Volume: Goal: Ability to maintain a balanced intake and output will improve Outcome: Progressing   Problem: Nutritional: Goal: Maintenance of adequate nutrition will improve Outcome: Progressing Goal: Progress toward achieving an optimal weight will improve Outcome: Progressing   Problem: Tissue Perfusion: Goal: Adequacy of tissue perfusion will improve Outcome: Progressing

## 2023-10-23 NOTE — Assessment & Plan Note (Signed)
-   We will continue antihypertensive therapy.

## 2023-10-23 NOTE — Assessment & Plan Note (Signed)
 -  The patient will be admitted to a medically monitored bed. - We will place the patient IV steroid therapy with IV Solu-Medrol as well as nebulized bronchodilator therapy with duonebs q.i.d. and q.4 hours p.r.n.Marland Kitchen - Mucolytic therapy will be provided with Mucinex and antibiotic therapy with IV Rocephin. - O2 protocol will be followed.

## 2023-10-23 NOTE — Assessment & Plan Note (Signed)
-   The patient will be placed on supplemental coverage with NovoLog. - We will hold off metformin. 

## 2023-10-23 NOTE — Assessment & Plan Note (Signed)
-   This is clearly secondary to #1. - Management as above. - O2 protocol will be followed as mentioned above.

## 2023-10-23 NOTE — Assessment & Plan Note (Signed)
 -  We will continue statin therapy.

## 2023-10-23 NOTE — TOC CM/SW Note (Signed)
 Transition of Care [facility]) - Inpatient Brief Assessment   Patient Details  Name: Bruce Fisher MRN: [id number] Date of Birth: 1961/05/31  Transition of Care [facility]) CM/SW Contact:    Villa Herb, LCSWA Phone Number: 10/23/2023, 9:55 AM   Clinical Narrative: Transition of Care Department [facility]) has reviewed patient and no TOC needs have been identified at this time. We will continue to monitor patient advancement through interdiciplinary progression rounds. If new patient transition needs arise, please place a TOC consult.   Transition of Care Asessment: Insurance and Status: Insurance coverage has been reviewed Patient has primary care physician: Yes Home environment has been reviewed: From home Prior level of function:: Independent Prior/Current Home Services: No current home services Social Drivers of Health Review: SDOH reviewed no interventions necessary Readmission risk has been reviewed: Yes Transition of care needs: no transition of care needs at this time

## 2023-10-23 NOTE — ED Provider Notes (Signed)
 [place] EMERGENCY DEPARTMENT AT [facility] Provider Note   CSN: [id number] Arrival date & time: 10/23/23  0050     History  Chief Complaint  Patient presents with   Shortness of Breath    Bruce Fisher is a 63 y.o. male.  Patient is a 63 year old male with past medical history of recent admission for influenza A with suspected COPD exacerbation.  Patient presenting today with complaints of shortness of breath.  This has been worsening over the past 2 days.  He describes cough that has been nonproductive.  No fevers or chills.  He has been using his albuterol inhaler with little relief.       Home Medications Prior to Admission medications   Medication Sig Start Date End Date Taking? Authorizing Provider  albuterol (VENTOLIN HFA) 108 (90 Base) MCG/ACT inhaler Inhale 2 puffs into the lungs every 4 (four) hours as needed for wheezing or shortness of breath. 09/24/23   Johnson, Clanford L, MD  amLODipine (NORVASC) 10 MG tablet Take 1 tablet (10 mg total) by mouth daily. for blood pressure 03/16/23   Mechele Claude, MD  Cholecalciferol (VITAMIN D PO) Take 1 tablet by mouth daily.    [provider]  Coenzyme Q10 (CO Q-10) 100 MG CAPS Take 100 mg by mouth daily. 06/22/22   Mechele Claude, MD  diphenhydrAMINE (BENADRYL) 25 mg capsule Take 1 capsule (25 mg total) by mouth at bedtime. 08/24/23   Dettinger, Elige Radon, MD  loratadine (CLARITIN) 10 MG tablet Take 1 tablet (10 mg total) by mouth daily. 08/24/23   Dettinger, Elige Radon, MD  metFORMIN (GLUCOPHAGE) 500 MG tablet Take 1 tablet (500 mg total) by mouth daily with breakfast. 03/16/23 03/10/24  Mechele Claude, MD  Multiple Vitamin (MULTIVITAMIN WITH MINERALS) TABS tablet Take 1 tablet by mouth daily.    [provider]  rosuvastatin (CRESTOR) 20 MG tablet Take 1 tablet (20 mg total) by mouth daily. for cholesterol. 03/16/23   Mechele Claude, MD  triamcinolone (NASACORT) 55 MCG/ACT AERO nasal inhaler Place 2 sprays  into the nose at bedtime. 08/24/23   Dettinger, Elige Radon, MD  valsartan-hydrochlorothiazide (DIOVAN-HCT) 160-25 MG tablet Take 1 tablet by mouth daily. 09/07/23   Mechele Claude, MD      Allergies    Patient has no known allergies.    Review of Systems   Review of Systems  All other systems reviewed and are negative.   Physical Exam Updated Vital Signs BP (!) 159/84   Pulse (!) 115   Temp 98.3 F (36.8 C) (Axillary)   Resp (!) 24   Ht 6\' 3"  (1.905 m)   Wt 108 kg   SpO2 92%   BMI 29.76 kg/m  Physical Exam Vitals and nursing note reviewed.  Constitutional:      General: He is not in acute distress.    Appearance: He is well-developed. He is not diaphoretic.  HENT:     Head: Normocephalic and atraumatic.  Cardiovascular:     Rate and Rhythm: Normal rate and regular rhythm.     Heart sounds: No murmur heard.    No friction rub.  Pulmonary:     Effort: Pulmonary effort is normal. No respiratory distress.     Breath sounds: Examination of the right-middle field reveals rhonchi. Examination of the left-middle field reveals rhonchi. Rhonchi present. No wheezing or rales.  Abdominal:     General: Bowel sounds are normal. There is no distension.     Palpations: Abdomen is soft.  Tenderness: There is no abdominal tenderness.  Musculoskeletal:        General: Normal range of motion.     Cervical back: Normal range of motion and neck supple.     Right lower leg: No tenderness. No edema.     Left lower leg: No tenderness. No edema.  Skin:    General: Skin is warm and dry.  Neurological:     Mental Status: He is alert and oriented to person, place, and time.     Coordination: Coordination normal.     ED Results / Procedures / Treatments   Labs (all labs ordered are listed, but only abnormal results are displayed) Labs Reviewed - No data to display  EKG EKG Interpretation Date/Time:  Monday October 23 2023 01:12:09 EDT Ventricular Rate:  118 PR Interval:  210 QRS  Duration:  79 QT Interval:  365 QTC Calculation: 512 R Axis:   77  Text Interpretation: Sinus tachycardia Prolonged PR interval RAE, consider biatrial enlargement Probable anteroseptal infarct, old No significant change since 09/22/2023 Confirmed by Geoffery Lyons ([id number]) on 10/23/2023 1:21:03 AM  Radiology No results found.  Procedures Procedures    Medications Ordered in ED Medications  methylPREDNISolone sodium succinate (SOLU-MEDROL) 125 mg/2 mL injection 125 mg (has no administration in time range)  ipratropium-albuterol (DUONEB) 0.5-2.5 (3) MG/3ML nebulizer solution 3 mL (has no administration in time range)  ipratropium-albuterol (DUONEB) 0.5-2.5 (3) MG/3ML nebulizer solution 3 mL (has no administration in time range)    ED Course/ Medical Decision Making/ A&P  Patient is a 64 year old male presenting with shortness of breath as described in the HPI.  Patient arrives with stable vital signs and is afebrile.  His oxygen saturations are mid 90s on 3 L nasal cannula, but dropped to 88% on room air.  Examination reveals increased work of breathing and expiratory rhonchi throughout.  Laboratory studies obtained including CBC, metabolic panel, troponin, and BNP.  Studies basically unremarkable.  Chest x-ray showing no acute process.  Patient has received 2 DuoNebs along with steroids, but continues to wheeze, have increased work of breathing, and oxygen saturations in the upper 80s.  I feel as though patient will require admission for management of COPD exacerbation.  I have spoken with the hospitalist who agrees to admit.  Final Clinical Impression(s) / ED Diagnoses Final diagnoses:  None    Rx / DC Orders ED Discharge Orders     None         Geoffery Lyons, MD 10/23/23 [phone]

## 2023-10-24 DIAGNOSIS — Z8249 Family history of ischemic heart disease and other diseases of the circulatory system: Secondary | ICD-10-CM | POA: Diagnosis not present

## 2023-10-24 DIAGNOSIS — Z79899 Other long term (current) drug therapy: Secondary | ICD-10-CM | POA: Diagnosis not present

## 2023-10-24 DIAGNOSIS — I471 Supraventricular tachycardia, unspecified: Secondary | ICD-10-CM | POA: Diagnosis not present

## 2023-10-24 DIAGNOSIS — I119 Hypertensive heart disease without heart failure: Secondary | ICD-10-CM | POA: Diagnosis not present

## 2023-10-24 DIAGNOSIS — I440 Atrioventricular block, first degree: Secondary | ICD-10-CM | POA: Diagnosis not present

## 2023-10-24 DIAGNOSIS — Z1152 Encounter for screening for COVID-19: Secondary | ICD-10-CM | POA: Diagnosis not present

## 2023-10-24 DIAGNOSIS — E785 Hyperlipidemia, unspecified: Secondary | ICD-10-CM | POA: Diagnosis not present

## 2023-10-24 DIAGNOSIS — J9601 Acute respiratory failure with hypoxia: Secondary | ICD-10-CM | POA: Diagnosis not present

## 2023-10-24 DIAGNOSIS — D72829 Elevated white blood cell count, unspecified: Secondary | ICD-10-CM | POA: Diagnosis not present

## 2023-10-24 DIAGNOSIS — E119 Type 2 diabetes mellitus without complications: Secondary | ICD-10-CM | POA: Diagnosis not present

## 2023-10-24 DIAGNOSIS — E669 Obesity, unspecified: Secondary | ICD-10-CM | POA: Diagnosis not present

## 2023-10-24 DIAGNOSIS — I10 Essential (primary) hypertension: Secondary | ICD-10-CM | POA: Diagnosis not present

## 2023-10-24 DIAGNOSIS — J441 Chronic obstructive pulmonary disease with (acute) exacerbation: Secondary | ICD-10-CM | POA: Diagnosis not present

## 2023-10-24 LAB — GLUCOSE, CAPILLARY
Glucose-Capillary: 179 mg/dL — ABNORMAL HIGH (ref 70–99)
Glucose-Capillary: 164 mg/dL — ABNORMAL HIGH (ref 70–99)

## 2023-10-24 MED ORDER — IRBESARTAN 150 MG PO TABS
150.0000 mg | ORAL_TABLET | Freq: Every day | ORAL | 2 refills | Status: DC
Start: 2023-10-25 — End: 2024-01-18

## 2023-10-24 MED ORDER — POTASSIUM CHLORIDE CRYS ER 20 MEQ PO TBCR: 40.0000 meq | EXTENDED_RELEASE_TABLET | Freq: Two times a day (BID) | ORAL | 0 refills | Status: DC

## 2023-10-24 MED ORDER — SODIUM CHLORIDE 0.9 % IV BOLUS
250.0000 mL | Freq: Once | INTRAVENOUS | Status: AC
  Administered 2023-10-24: 250 mL via INTRAVENOUS

## 2023-10-24 MED ORDER — PREDNISONE 20 MG PO TABS: ORAL_TABLET | ORAL | 0 refills | Status: DC

## 2023-10-24 MED ORDER — DILTIAZEM HCL 25 MG/5ML IV SOLN
5.0000 mg | Freq: Once | INTRAVENOUS | Status: AC
  Administered 2023-10-24: 5 mg via INTRAVENOUS
  Filled 2023-10-24: qty 5

## 2023-10-24 MED ORDER — METOPROLOL TARTRATE 5 MG/5ML IV SOLN
INTRAVENOUS | Status: AC
  Administered 2023-10-24: 5 mg via INTRAVENOUS
  Filled 2023-10-24: qty 5

## 2023-10-24 MED ORDER — ORAL CARE MOUTH RINSE: 15.0000 mL | OROMUCOSAL | Status: DC | PRN

## 2023-10-24 MED ORDER — PANTOPRAZOLE SODIUM 40 MG PO TBEC: 40.0000 mg | DELAYED_RELEASE_TABLET | Freq: Every day | ORAL | 1 refills | Status: DC

## 2023-10-24 MED ORDER — BUDESONIDE-FORMOTEROL FUMARATE 160-4.5 MCG/ACT IN AERO: 2.0000 | INHALATION_SPRAY | Freq: Two times a day (BID) | RESPIRATORY_TRACT | 12 refills | Status: DC

## 2023-10-24 MED ORDER — METOPROLOL TARTRATE 5 MG/5ML IV SOLN: 5.0000 mg | Freq: Once | INTRAVENOUS | Status: AC

## 2023-10-24 MED ORDER — POTASSIUM CHLORIDE CRYS ER 20 MEQ PO TBCR
40.0000 meq | EXTENDED_RELEASE_TABLET | Freq: Once | ORAL | Status: AC
  Administered 2023-10-24: 40 meq via ORAL
  Filled 2023-10-24: qty 2

## 2023-10-24 MED ORDER — DILTIAZEM HCL 30 MG PO TABS: 30.0000 mg | ORAL_TABLET | Freq: Two times a day (BID) | ORAL | 1 refills | Status: DC

## 2023-10-24 MED ORDER — DOXYCYCLINE HYCLATE 100 MG PO TABS: 100.0000 mg | ORAL_TABLET | Freq: Two times a day (BID) | ORAL | 0 refills | Status: AC

## 2023-10-24 MED ORDER — DM-GUAIFENESIN ER 30-600 MG PO TB12: 1.0000 | ORAL_TABLET | Freq: Two times a day (BID) | ORAL | 0 refills | Status: AC

## 2023-10-24 NOTE — Discharge Summary (Signed)
 Physician Discharge Summary   Patient: Bruce Fisher MRN: [id number] DOB: 09-26-1960  Admit date:     10/23/2023  Discharge date: 10/24/23  Discharge Physician: Vassie Loll   PCP: Mechele Claude, MD   Recommendations at discharge:  Repeat basic metabolic panel to follow electrolytes and renal function Reassess blood pressure and adjust medication as needed Continue to closely follow patient's CBGs/A1c fluctuation and further adjust hypoglycemic regimen as needed. Make sure patient follow-up with cardiology service and pulmonologist as instructed.  Discharge Diagnoses: Principal Problem:   COPD exacerbation (HCC) Active Problems:   Type 2 diabetes mellitus without complication, without long-term current use of insulin (HCC)   Acute respiratory failure with hypoxia [facility])   Essential hypertension   Dyslipidemia  Brief Hospital admission narrative: As per H&P written by [facility] on 10/23/2023 Bruce Fisher is a 63 y.o. African-American male with medical history significant for obesity, type 2 diabetes mellitus, hypertension, dyslipidemia and urolithiasis, who presented to the emergency room with acute onset of worsening dyspnea with associated nonproductive cough and wheezing for the last couple of days.  He has been having chest pain only with excessive cough.  He denies any fever or chills.  No nausea or vomiting or abdominal pain.  No dysuria, oliguria or hematuria or flank pain.  He has used her albuterol inhaler without relief.   ED Course: Upon presentation to the emergency room, BP was 178/97 heart rate 115 respiratory rate 24 and pulse oximetry 87% on room air and 93% - 96% on 3 L of O2 by nasal cannula.  BMP revealed blood glucose of 158.  High sensitive troponin I was 4 and later 5.  BNP was 25.  CBC showed leukocytosis of 11 with neutrophilia.  Respiratory panel came back negative.     EKG as reviewed by me : EKG showed sinus tachycardia with a rate of 118 with prolonged PR  interval with biatrial enlargement and Q waves anteroseptally. Imaging: Portable chest x-ray showed no acute cardiopulmonary disease.   The patient was given DuoNebs twice and 125 mg of IV Solu-Medrol..  The patient will be admitted to a medical telemetry bed for further evaluation and management.  Assessment and Plan: * COPD exacerbation (HCC) -Excellent response to IV steroids, bronchodilator management, antibiotics, mucolytic's and the use of flutter valve -Patient received treatment with Pulmicort and Brovana while inpatient -At discharge empirically initiation of Symbicort for maintenance therapy along with instruction for rescue inhalers provided -Oral steroid tapering has been instructed and recommendations to follow-up with pulmonologist requested. -Patient able to speak in full sentences and not requiring oxygen supplementation at discharge.  Acute respiratory failure with hypoxia (HCC) -Transient and secondary to acute COPD exacerbation -Patient was weaned off oxygen supplementation and demonstrating good saturation at time of discharge.  Type 2 diabetes mellitus without complication, without long-term current use of insulin (HCC) -Anticipating some elevation in his CBGs while receiving treatment with steroids -Resume home oral hypoglycemic agents and patient has been instructed to follow modified carbohydrate diet -Continue to follow CBG fluctuation and adjust treatment as needed.  Essential hypertension/SVT -Cardiology service was curbside and recommended changing patient's Norvasc to the use of Cardizem 30 mg twice a day -In order to prevent hypotension patient's benazepril was exchanged to the use of Avapro and his diuretic has been discontinued at the moment -Heart healthy/low-sodium diet has been requested -Patient advised to maintain adequate hydration.  Dyslipidemia -Continue statin -Heart healthy/low-fat diet discussed with patient.  GERD/GI  prophylaxis -Prescription for PPI provided.  Consultants: Cardiology service curbside Procedures performed: See below for x-ray reports. Disposition: Home Diet recommendation: Heart healthy/modified carbohydrate diet.  DISCHARGE MEDICATION: Allergies as of 10/24/2023   No Known Allergies      Medication List     STOP taking these medications    amLODipine 10 MG tablet Commonly known as: NORVASC   benazepril 40 MG tablet Commonly known as: LOTENSIN   diphenhydrAMINE 25 mg capsule Commonly known as: BENADRYL   valsartan-hydrochlorothiazide 160-25 MG tablet Commonly known as: DIOVAN-HCT       TAKE these medications    albuterol 108 (90 Base) MCG/ACT inhaler Commonly known as: VENTOLIN HFA Inhale 2 puffs into the lungs every 4 (four) hours as needed for wheezing or shortness of breath.   aspirin EC 81 MG tablet Take 81 mg by mouth daily. Swallow whole.   budesonide-formoterol 160-4.5 MCG/ACT inhaler Commonly known as: Symbicort Inhale 2 puffs into the lungs 2 times daily at 12 noon and 4 pm.   Co Q-10 100 MG Caps Take 100 mg by mouth daily.   dextromethorphan-guaiFENesin 30-600 MG 12hr tablet Commonly known as: MUCINEX DM Take 1 tablet by mouth 2 (two) times daily for 7 days.   diltiazem 30 MG tablet Commonly known as: Cardizem Take 1 tablet (30 mg total) by mouth 2 (two) times daily.   doxycycline 100 MG tablet Commonly known as: VIBRA-TABS Take 1 tablet (100 mg total) by mouth 2 (two) times daily for 4 days.   irbesartan 150 MG tablet Commonly known as: AVAPRO Take 1 tablet (150 mg total) by mouth daily. Start taking on: October 25, 2023   loratadine 10 MG tablet Commonly known as: CLARITIN Take 1 tablet (10 mg total) by mouth daily.   metFORMIN 500 MG tablet Commonly known as: GLUCOPHAGE Take 1 tablet (500 mg total) by mouth daily with breakfast. What changed: when to take this   multivitamin with minerals Tabs tablet Take 1 tablet by mouth  daily.   pantoprazole 40 MG tablet Commonly known as: Protonix Take 1 tablet (40 mg total) by mouth daily.   potassium chloride SA 20 MEQ tablet Commonly known as: KLOR-CON M Take 2 tablets (40 mEq total) by mouth 2 (two) times daily for 3 days.   predniSONE 20 MG tablet Commonly known as: DELTASONE Take 3 tablets by mouth daily x 1 day; then 2 tablets by mouth daily x 2 days; then 1 tablet by mouth daily x 3 days; then half tablet by mouth daily x 3 days and stop prednisone. Start taking on: October 25, 2023   rosuvastatin 20 MG tablet Commonly known as: CRESTOR Take 1 tablet (20 mg total) by mouth daily. for cholesterol.   triamcinolone 55 MCG/ACT Aero nasal inhaler Commonly known as: NASACORT Place 2 sprays into the nose at bedtime.   VITAMIN D PO Take 1 tablet by mouth daily.        Follow-up Information     Stacks, Broadus John, MD. Schedule an appointment as soon as possible for a visit in 10 day(s).   Specialty: Family Medicine Contact information: [address] [place] [address] [phone]         Nyoka Cowden, MD. Schedule an appointment as soon as possible for a visit in 2 week(s).   Specialty: Pulmonary Disease Contact information: [address] [place] [address] [phone]                Discharge Exam: Filed Weights   10/23/23 0105 10/23/23 0500  Weight: 108 kg 108.8 kg   General exam: Alert, awake, oriented x 3 Respiratory system: Clear to auscultation. Respiratory effort normal. Cardiovascular system:RRR. No murmurs, rubs, gallops. Gastrointestinal system: Abdomen is nondistended, soft and nontender. No organomegaly or masses felt. Normal bowel sounds heard. Central nervous system: Alert and oriented. No focal neurological deficits. Extremities: No C/C/E, +pedal pulses Skin: No rashes, lesions or ulcers Psychiatry: Judgement and insight appear normal. Mood & affect appropriate.    Condition at discharge: Stable and  improved.  The results of significant diagnostics from this hospitalization (including imaging, microbiology, ancillary and laboratory) are listed below for reference.   Imaging Studies: DG Chest Port 1 View Result Date: 10/23/2023 CLINICAL DATA:  Dyspnea EXAM: PORTABLE CHEST 1 VIEW COMPARISON:  None Available. FINDINGS: The heart size and mediastinal contours are within normal limits. Both lungs are clear. The visualized skeletal structures are unremarkable. IMPRESSION: No active disease. Electronically Signed   By: Helyn Numbers M.D.   On: 10/23/2023 02:07    Microbiology: Results for orders placed or performed during the hospital encounter of 10/23/23  Resp panel by RT-PCR (RSV, Flu A&B, Covid) Anterior Nasal Swab     Status: None   Collection Time: 10/23/23  1:41 AM   Specimen: Anterior Nasal Swab  Result Value Ref Range Status   SARS Coronavirus 2 by RT PCR NEGATIVE NEGATIVE Final    Comment: (NOTE) SARS-CoV-2 target nucleic acids are NOT DETECTED.  The SARS-CoV-2 RNA is generally detectable in upper respiratory specimens during the acute phase of infection. The lowest concentration of SARS-CoV-2 viral copies this assay can detect is 138 copies/mL. A negative result does not preclude SARS-Cov-2 infection and should not be used as the sole basis for treatment or other patient management decisions. A negative result may occur with  improper specimen collection/handling, submission of specimen other than nasopharyngeal swab, presence of viral mutation(s) within the areas targeted by this assay, and inadequate number of viral copies(<138 copies/mL). A negative result must be combined with clinical observations, patient history, and epidemiological information. The expected result is Negative.  Fact Sheet for Patients:  BloggerCourse.com  Fact Sheet for Healthcare Providers:  SeriousBroker.it  This test is no t yet approved or  cleared by the [place] FDA and  has been authorized for detection and/or diagnosis of SARS-CoV-2 by FDA under an Emergency Use Authorization (EUA). This EUA will remain  in effect (meaning this test can be used) for the duration of the COVID-19 declaration under Section 564(b)(1) of the Act, 21 U.S.C.section 360bbb-3(b)(1), unless the authorization is terminated  or revoked sooner.       Influenza A by PCR NEGATIVE NEGATIVE Final   Influenza B by PCR NEGATIVE NEGATIVE Final    Comment: (NOTE) The Xpert Xpress SARS-CoV-2/FLU/RSV plus assay is intended as an aid in the diagnosis of influenza from Nasopharyngeal swab specimens and should not be used as a sole basis for treatment. Nasal washings and aspirates are unacceptable for Xpert Xpress SARS-CoV-2/FLU/RSV testing.  Fact Sheet for Patients: BloggerCourse.com  Fact Sheet for Healthcare Providers: SeriousBroker.it  This test is not yet approved or cleared by the [place] FDA and has been authorized for detection and/or diagnosis of SARS-CoV-2 by FDA under an Emergency Use Authorization (EUA). This EUA will remain in effect (meaning this test can be used) for the duration of the COVID-19 declaration under Section 564(b)(1) of the Act, 21 U.S.C. section 360bbb-3(b)(1), unless the authorization is terminated or revoked.     Resp Syncytial  Virus by PCR NEGATIVE NEGATIVE Final    Comment: (NOTE) Fact Sheet for Patients: BloggerCourse.com  Fact Sheet for Healthcare Providers: SeriousBroker.it  This test is not yet approved or cleared by the [place] FDA and has been authorized for detection and/or diagnosis of SARS-CoV-2 by FDA under an Emergency Use Authorization (EUA). This EUA will remain in effect (meaning this test can be used) for the duration of the COVID-19 declaration under Section 564(b)(1) of the Act, 21  U.S.C. section 360bbb-3(b)(1), unless the authorization is terminated or revoked.  Performed at [facility], [address] [place], [place] [address]     Labs: CBC: Recent Labs  Lab 10/23/23 0141 10/23/23 0341  WBC 11.0* 10.5  NEUTROABS 9.0*  --   HGB 15.1 14.5  HCT 46.5 43.8  MCV 90.1 89.2  PLT 185 185   Basic Metabolic Panel: Recent Labs  Lab 10/23/23 0141 10/23/23 0341  NA 139 139  K 3.7 3.4*  CL 103 105  CO2 26 22  GLUCOSE 158* 173*  BUN 20 19  CREATININE 1.11 1.04  CALCIUM 9.6 9.3   CBG: Recent Labs  Lab 10/23/23 1222 10/23/23 1713 10/23/23 1958 10/24/23 0758 10/24/23 1146  GLUCAP 243* 225* 245* 179* 164*    Discharge time spent: greater than 30 minutes.  Signed: Vassie Loll, MD [facility] 10/24/2023

## 2023-10-24 NOTE — Progress Notes (Signed)
SATURATION QUALIFICATIONS: (This note is used to comply with regulatory documentation for home oxygen)  Patient Saturations on Room Air at Rest = 94%  Patient Saturations on Room Air while Ambulating = 92%  

## 2023-10-25 ENCOUNTER — Telehealth: Payer: Self-pay

## 2023-10-25 NOTE — Transitions of Care (Post Inpatient/ED Visit) (Signed)
   10/25/2023  Name: Bruce Fisher MRN: [id number] DOB: Apr 05, 1961  Today's TOC FU Call Status: Today's TOC FU Call Status:: Unsuccessful Call (1st Attempt) Unsuccessful Call (1st Attempt) Date: 10/25/23  Attempted to reach the patient regarding the most recent Inpatient/ED visit. Patient was called in an Outreach attempt to offer VBCI  30-day TOC program. Pt is eligible for program due to potential risk for readmission and/or high utilization. Unfortunately, I was not able to speak with the patient in regards to recent hospital discharge   Left a HIPAA compliant phone message for patient including VBCI CM contact information with request for a call back in regard to recent hospital discharge     Follow Up Plan: Additional outreach attempts will be made to reach the patient to complete the Transitions of Care (Post Inpatient/ED visit) call.   Susa Loffler , BSN, RN [facility]   VBCI-Population Health RN Care Manager Direct Dial [phone]  Fax: [phone] Website: [email or web address]

## 2023-10-26 ENCOUNTER — Telehealth: Payer: Self-pay

## 2023-10-26 NOTE — Transitions of Care (Post Inpatient/ED Visit) (Signed)
   10/26/2023  Name: Bruce Fisher MRN: [id number] DOB: Jan 09, 1961  Today's TOC FU Call Status: Today's TOC FU Call Status:: Unsuccessful Call (2nd Attempt) Unsuccessful Call (2nd Attempt) Date: 10/26/23  Attempted to reach the patient regarding the most recent Inpatient/ED visit.  Follow Up Plan: Additional outreach attempts will be made to reach the patient to complete the Transitions of Care (Post Inpatient/ED visit) call.   Lonia Chimera, RN, BSN, CEN [employer]- Transition of Care Team.  [facility] [phone]

## 2023-10-27 ENCOUNTER — Telehealth: Payer: Self-pay | Admitting: *Deleted

## 2023-10-27 NOTE — Transitions of Care (Post Inpatient/ED Visit) (Signed)
   10/27/2023  Name: Bruce Fisher MRN: [id number] DOB: [birth year]  Today's TOC FU Call Status: Today's TOC FU Call Status:: Unsuccessful Call (3rd Attempt) Unsuccessful Call (3rd Attempt) Date: 10/27/23  Attempted to reach the patient regarding the most recent Inpatient/ED visit.  Follow Up Plan: No further outreach attempts will be made at this time. We have been unable to contact the patient.  Irving Shows [facility], BSN RN Care Manager/ Transition of Care [place]/ [facility] [phone]

## 2023-11-01 ENCOUNTER — Telehealth: Payer: Self-pay | Admitting: Family Medicine

## 2023-11-03 ENCOUNTER — Telehealth: Payer: Self-pay

## 2023-11-03 DIAGNOSIS — I10 Essential (primary) hypertension: Secondary | ICD-10-CM

## 2023-11-03 NOTE — Patient Outreach (Signed)
 Submitted emailed referral for assistance.   [facility]  Population Health Care Management Assistant  Direct Dial: [phone]  Fax: [phone] Website: [email or web address]

## 2023-11-08 ENCOUNTER — Telehealth: Payer: Self-pay

## 2023-11-16 NOTE — Progress Notes (Signed)
 Complex Care Management Note Care Guide Note  11/16/2023 Name: Bruce Fisher MRN: [id number] DOB: 11/24/1960   Complex Care Management Outreach Attempts: A third unsuccessful outreach was attempted today to offer the patient with information about available complex care management services.  Follow Up Plan:  No further outreach attempts will be made at this time. We have been unable to contact the patient to offer or enroll patient in complex care management services.  Encounter Outcome:  No Answer  [facility]  [facility] Care Management Assistant Direct Dial: [phone]  Fax: [phone]

## 2023-11-22 DIAGNOSIS — I471 Supraventricular tachycardia, unspecified: Secondary | ICD-10-CM | POA: Insufficient documentation

## 2023-11-22 NOTE — Progress Notes (Deleted)
 Cardiology Office Note   Date:  11/22/2023   ID:  Joron, Carlough 1961-07-12, MRN [id number]  PCP:  Roise Cleaver, MD  Cardiologist:   None Referring:  ***  No chief complaint on file.     History of Present Illness: Bruce Fisher is a 63 y.o. male who presents for ***    I saw him years ago for chest pain.  She had a negative POET (Plain Old Exercise Treadmill).    He was in the hospital in April with COPD exacerbation.  He had SVT.  ***    Past Medical History:  Diagnosis Date   COPD (chronic obstructive pulmonary disease) (HCC)    Diabetes mellitus without complication (HCC)    Hyperlipidemia    Hypertension    Kidney stones    Rectal spasm    Renal disorder    kidney stones    Past Surgical History:  Procedure Laterality Date   SPINE SURGERY     lower back     Current Outpatient Medications  Medication Sig Dispense Refill   albuterol  (VENTOLIN  HFA) 108 (90 Base) MCG/ACT inhaler Inhale 2 puffs into the lungs every 4 (four) hours as needed for wheezing or shortness of breath. 8 g 3   aspirin EC 81 MG tablet Take 81 mg by mouth daily. Swallow whole.     budesonide -formoterol  (SYMBICORT ) 160-4.5 MCG/ACT inhaler Inhale 2 puffs into the lungs 2 times daily at 12 noon and 4 pm. 1 each 12   Cholecalciferol  (VITAMIN D  PO) Take 1 tablet by mouth daily.     Coenzyme Q10 (CO Q-10) 100 MG CAPS Take 100 mg by mouth daily.     diltiazem  (CARDIZEM ) 30 MG tablet Take 1 tablet (30 mg total) by mouth 2 (two) times daily. 60 tablet 1   irbesartan  (AVAPRO ) 150 MG tablet Take 1 tablet (150 mg total) by mouth daily. 30 tablet 2   loratadine  (CLARITIN ) 10 MG tablet Take 1 tablet (10 mg total) by mouth daily. 90 tablet 1   metFORMIN  (GLUCOPHAGE ) 500 MG tablet Take 1 tablet (500 mg total) by mouth daily with breakfast. (Patient taking differently: Take 500 mg by mouth 2 (two) times daily with a meal.) 90 tablet 3   Multiple Vitamin (MULTIVITAMIN WITH MINERALS) TABS tablet Take 1  tablet by mouth daily.     pantoprazole  (PROTONIX ) 40 MG tablet Take 1 tablet (40 mg total) by mouth daily. 30 tablet 1   potassium chloride  SA (KLOR-CON  M) 20 MEQ tablet Take 2 tablets (40 mEq total) by mouth 2 (two) times daily for 3 days. 6 tablet 0   predniSONE  (DELTASONE ) 20 MG tablet Take 3 tablets by mouth daily x 1 day; then 2 tablets by mouth daily x 2 days; then 1 tablet by mouth daily x 3 days; then half tablet by mouth daily x 3 days and stop prednisone . 12 tablet 0   rosuvastatin  (CRESTOR ) 20 MG tablet Take 1 tablet (20 mg total) by mouth daily. for cholesterol. 90 tablet 3   triamcinolone  (NASACORT ) 55 MCG/ACT AERO nasal inhaler Place 2 sprays into the nose at bedtime. 16.9 g 11   No current facility-administered medications for this visit.    Allergies:   Patient has no known allergies.    Social History:  The patient  reports that he quit smoking about 30 years ago. His smoking use included cigarettes. He has never used smokeless tobacco. He reports that he does not drink alcohol and does not use  drugs.   Family History:  The patient's ***family history includes Cancer in his father and sister; Diabetes in his mother; Heart disease in his sister; Heart failure in his mother.    ROS:  Please see the history of present illness.   Otherwise, review of systems are positive for {NONE DEFAULTED:18576}.   All other systems are reviewed and negative.    PHYSICAL EXAM: VS:  There were no vitals taken for this visit. , BMI There is no height or weight on file to calculate BMI. GENERAL:  Well appearing HEENT:  Pupils equal round and reactive, fundi not visualized, oral mucosa unremarkable NECK:  No jugular venous distention, waveform within normal limits, carotid upstroke brisk and symmetric, no bruits, no thyromegaly LYMPHATICS:  No cervical, inguinal adenopathy LUNGS:  Clear to auscultation bilaterally BACK:  No CVA tenderness CHEST:  Unremarkable HEART:  PMI not displaced or  sustained,S1 and S2 within normal limits, no S3, no S4, no clicks, no rubs, *** murmurs ABD:  Flat, positive bowel sounds normal in frequency in pitch, no bruits, no rebound, no guarding, no midline pulsatile mass, no hepatomegaly, no splenomegaly EXT:  2 plus pulses throughout, no edema, no cyanosis no clubbing SKIN:  No rashes no nodules NEURO:  Cranial nerves II through XII grossly intact, motor grossly intact throughout PSYCH:  Cognitively intact, oriented to person place and time    EKG:        Recent Labs: 09/22/2023: ALT 24 09/23/2023: Magnesium  2.1 10/23/2023: B Natriuretic Peptide 25.0; BUN 19; Creatinine, Ser 1.04; Hemoglobin 14.5; Platelets 185; Potassium 3.4; Sodium 139    Lipid Panel    Component Value Date/Time   CHOL 120 03/16/2023 1257   TRIG 118 03/16/2023 1257   TRIG 208 (H) 08/19/2013 1637   HDL 38 (L) 03/16/2023 1257   HDL 42 08/19/2013 1637   CHOLHDL 3.2 03/16/2023 1257   LDLCALC 61 03/16/2023 1257   LDLCALC 93 08/19/2013 1637      Wt Readings from Last 3 Encounters:  10/23/23 239 lb 13.8 oz (108.8 kg)  09/22/23 239 lb 3.2 oz (108.5 kg)  09/20/23 248 lb (112.5 kg)      Other studies Reviewed: Additional studies/ records that were reviewed today include: ***. Review of the above records demonstrates:  Please see elsewhere in the note.  ***   ASSESSMENT AND PLAN:  SVT:  ***  HTN:  ***  Dyslipidemia:  ***      Current medicines are reviewed at length with the patient today.  The patient {ACTIONS; HAS/DOES NOT HAVE:19233} concerns regarding medicines.  The following changes have been made:  {PLAN; NO CHANGE:13088:s}  Labs/ tests ordered today include: *** No orders of the defined types were placed in this encounter.    Disposition:   FU with ***    Signed, Eilleen Grates, MD  11/22/2023 9:28 PM    [facility]

## 2023-11-23 ENCOUNTER — Ambulatory Visit: Attending: Cardiology | Admitting: Cardiology

## 2023-11-23 DIAGNOSIS — E785 Hyperlipidemia, unspecified: Secondary | ICD-10-CM

## 2023-11-23 DIAGNOSIS — I10 Essential (primary) hypertension: Secondary | ICD-10-CM

## 2023-11-23 DIAGNOSIS — I471 Supraventricular tachycardia, unspecified: Secondary | ICD-10-CM

## 2023-11-29 NOTE — Progress Notes (Unsigned)
 Bruce Fisher, male    DOB: 04-Jul-1961    MRN: [id number]   Brief patient profile:  62  yobm  1995 s resp sequelae  referred to pulmonary clinic in [place]  11/30/2023 by [facility]  s/p admit:   Pt not previously seen by PCCM service.    Onset of bad rhinitis esp in am's around 2022-23 year round responsive to prednisone    CT sinus 09/22/23 1. Pansinusitis, appears to be acute on chronic. All paranasal sinuses affected and sinus drainage pathways opacified. But no complicating features are identified. 2. Associated Rhinitis.  Nasal septum intact   Admit date:     10/23/2023  Discharge date: 10/24/23  Discharge Physician: Justina Oman    PCP: Roise Cleaver, MD    Recommendations at discharge:  Repeat basic metabolic panel to follow electrolytes and renal function Reassess blood pressure and adjust medication as needed Continue to closely follow patient's CBGs/A1c fluctuation and further adjust hypoglycemic regimen as needed. Make sure patient follow-up with cardiology service and pulmonologist as instructed.   Discharge Diagnoses: Principal Problem:   COPD exacerbation (HCC)   Type 2 diabetes mellitus without complication, without long-term current use of insulin  (HCC)   Acute respiratory failure with hypoxia (HCC)   Essential hypertension   Dyslipidemia      History of Present Illness  11/30/2023  Pulmonary/ 1st office eval/ [place] / [place] [facility]  Since Aprl 15 th finished pred maint on breyna  Chief Complaint  Patient presents with   COPD  Dyspnea:  yardwork ok Cough: none in am, some daytime cough white  Sleep: flat bed, 3 pillows under head /  SABA use: not using  02: none      No obvious day to day or daytime pattern/variability or assoc excess/ purulent sputum or mucus plugs or hemoptysis or cp or chest tightness, subjective wheeze or overt sinus or hb symptoms.    Also denies any obvious fluctuation of symptoms with weather or environmental changes or  other aggravating or alleviating factors except as outlined above   No unusual exposure hx or h/o childhood pna/ asthma or knowledge of premature birth.  Current Allergies, Complete Past Medical History, Past Surgical History, Family History, and Social History were reviewed in [employer] record.  ROS  The following are not active complaints unless bolded Hoarseness, sore throat, dysphagia, dental problems, itching, sneezing,  nasal congestion or discharge of excess mucus or purulent secretions, ear ache,   fever, chills, sweats, unintended wt loss or wt gain, classically pleuritic or exertional cp,  orthopnea pnd or arm/hand swelling  or leg swelling, presyncope, palpitations, abdominal pain, anorexia, nausea, vomiting, diarrhea  or change in bowel habits or change in bladder habits, change in stools or change in urine, dysuria, hematuria,  rash, arthralgias, visual complaints, headache, numbness, weakness or ataxia or problems with walking or coordination,  change in mood or  memory.            Outpatient Medications Prior to Visit  Medication Sig Dispense Refill   albuterol  (VENTOLIN  HFA) 108 (90 Base) MCG/ACT inhaler Inhale 2 puffs into the lungs every 4 (four) hours as needed for wheezing or shortness of breath. 8 g 3   aspirin EC 81 MG tablet Take 81 mg by mouth daily. Swallow whole.     budesonide -formoterol  (SYMBICORT ) 160-4.5 MCG/ACT inhaler Inhale 2 puffs into the lungs 2 times daily at 12 noon and 4 pm. 1 each 12   Cholecalciferol  (VITAMIN D  PO) Take 1  tablet by mouth daily.     Coenzyme Q10 (CO Q-10) 100 MG CAPS Take 100 mg by mouth daily.     diltiazem  (CARDIZEM ) 30 MG tablet Take 1 tablet (30 mg total) by mouth 2 (two) times daily. 60 tablet 1   irbesartan  (AVAPRO ) 150 MG tablet Take 1 tablet (150 mg total) by mouth daily. 30 tablet 2   loratadine  (CLARITIN ) 10 MG tablet Take 1 tablet (10 mg total) by mouth daily. 90 tablet 1   metFORMIN  (GLUCOPHAGE ) 500 MG  tablet Take 1 tablet (500 mg total) by mouth daily with breakfast. (Patient taking differently: Take 500 mg by mouth 2 (two) times daily with a meal.) 90 tablet 3   Multiple Vitamin (MULTIVITAMIN WITH MINERALS) TABS tablet Take 1 tablet by mouth daily.     pantoprazole  (PROTONIX ) 40 MG tablet Take 1 tablet (40 mg total) by mouth daily. 30 tablet 1   potassium chloride  SA (KLOR-CON  M) 20 MEQ tablet Take 2 tablets (40 mEq total) by mouth 2 (two) times daily for 3 days. 6 tablet 0       0   rosuvastatin  (CRESTOR ) 20 MG tablet Take 1 tablet (20 mg total) by mouth daily. for cholesterol. 90 tablet 3   triamcinolone  (NASACORT ) 55 MCG/ACT AERO nasal inhaler Place 2 sprays into the nose at bedtime. 16.9 g 11   No facility-administered medications prior to visit.    Past Medical History:  Diagnosis Date   COPD (chronic obstructive pulmonary disease) (HCC)    Diabetes mellitus without complication (HCC)    Hyperlipidemia    Hypertension    Kidney stones    Rectal spasm    Renal disorder    kidney stones      Objective:     BP (!) 156/85 (BP Location: Left Arm)   Pulse (!) 58   Ht 6\' 3"  (1.905 m)   Wt 245 lb 3.2 oz (111.2 kg)   SpO2 93%   BMI 30.65 kg/m   SpO2: 93 % RA  Amb bm nasal tone to voice    HEENT : Oropharynx  clear/ no top teeth     Nasal turbinates mod swelling s polyps or cyanosis   NECK :  without  apparent JVD/ palpable Nodes/TM    LUNGS: no acc muscle use,  Nl contour chest which is clear to A and P bilaterally without cough on insp or exp maneuvers   CV:  RRR  no s3 or murmur or increase in P2, and no edema   ABD:  soft and nontender   MS:  Gait nl   ext warm without deformities Or obvious joint restrictions  calf tenderness, cyanosis or clubbing    SKIN: warm and dry without lesions    NEURO:  alert, approp, nl sensorium with  no motor or cerebellar deficits apparent.       Assessment   Asthmatic bronchitis , chronic (HCC) Onset around 2022 -  2023 -CT sinus 09/22/23 1. Pansinusitis, appears to be acute on chronic. All paranasal sinuses affected and sinus drainage pathways opacified. But no complicating features are identified. 2. Associated Rhinitis.  Nasal septum intact - Allergy screen 11/30/2023 >  Eos 0. /  IgE   He apperas to have AB triggered by chronic sinusitis which may be allergic in nature but vor now def needs to be on a maint rx and since already has apparently a lama/ics he should continue it pending f/u with all meds in hand using a trust but  verify approach to confirm accurate Medication  Reconciliation The principal here is that until we are certain that the  patients are doing what we've asked, it makes no sense to ask them to do more.   PFTs will be ordered unless he has a really strong allergy signal in which case I'll refer to allergy where spirometry can be done same day basis   - The proper method of use, as well as anticipated side effects, of a metered-dose inhaler were discussed and demonstrated to the patient using teach back method.   Discussed in detail all the  indications, usual  risks and alternatives  relative to the benefits with patient who agrees to proceed with Rx as outlined.             Each maintenance medication was reviewed in detail including emphasizing most importantly the difference between maintenance and prns and under what circumstances the prns are to be triggered using an action plan format where appropriate.  Total time for H and P, chart review, counseling, reviewing hfa device(s) and generating customized AVS unique to this office visit / same day charting = 48 min new pt eval          I     Vernestine Gondola, MD 11/30/2023

## 2023-11-30 ENCOUNTER — Encounter (HOSPITAL_COMMUNITY): Payer: Self-pay

## 2023-11-30 ENCOUNTER — Ambulatory Visit: Admitting: Internal Medicine

## 2023-11-30 ENCOUNTER — Encounter: Payer: Self-pay | Admitting: Internal Medicine

## 2023-11-30 VITALS — BP 156/85 | HR 58 | Ht 75.0 in | Wt 245.2 lb

## 2023-11-30 DIAGNOSIS — J4489 Other specified chronic obstructive pulmonary disease: Secondary | ICD-10-CM | POA: Diagnosis not present

## 2023-11-30 NOTE — Assessment & Plan Note (Addendum)
 Onset around 2022 - 2023 -CT sinus 09/22/23 1. Pansinusitis, appears to be acute on chronic. All paranasal sinuses affected and sinus drainage pathways opacified. But no complicating features are identified. 2. Associated Rhinitis.  Nasal septum intact - Allergy screen 11/30/2023 >  Eos 0. /  IgE   He apperas to have AB triggered by chronic sinusitis which may be allergic in nature but vor now def needs to be on a maint rx and since already has apparently a lama/ics he should continue it pending f/u with all meds in hand using a trust but verify approach to confirm accurate Medication  Reconciliation The principal here is that until we are certain that the  patients are doing what we've asked, it makes no sense to ask them to do more.   PFTs will be ordered unless he has a really strong allergy signal in which case I'll refer to allergy where spirometry can be done same day basis   - The proper method of use, as well as anticipated side effects, of a metered-dose inhaler were discussed and demonstrated to the patient using teach back method.   Discussed in detail all the  indications, usual  risks and alternatives  relative to the benefits with patient who agrees to proceed with Rx as outlined.             Each maintenance medication was reviewed in detail including emphasizing most importantly the difference between maintenance and prns and under what circumstances the prns are to be triggered using an action plan format where appropriate.  Total time for H and P, chart review, counseling, reviewing hfa device(s) and generating customized AVS unique to this office visit / same day charting = 48 min new pt eval          I

## 2023-11-30 NOTE — Patient Instructions (Addendum)
 Call me with the name of your inhalers  Plan A = Automatic = Always=    Breyna  160 Take 2 puffs first thing in am and then another 2 puffs about 12 hours later (pm dose is optional)    Work on inhaler technique:  relax and gently blow all the way out then take a nice smooth full deep breath back in, triggering the inhaler at same time you start breathing in.  Hold breath in for at least  5 seconds if you can. Blow out breyna   thru nose. Rinse and gargle with water when done.  If mouth or throat bother you at all,  try brushing teeth/gums/tongue with arm and hammer toothpaste/ make a slurry and gargle and spit out.     Plan B = Backup (to supplement plan A, not to replace it) Only use your albuterol  inhaler as a rescue medication to be used if you can't catch your breath by resting or doing a relaxed purse lip breathing pattern.  - The less you use it, the better it will work when you need it. - Ok to use the inhaler up to 2 puffs  every 4 hours if you must but call for appointment if use goes up over your usual need - Don't leave home without it !!  (think of it like the spare tire or starter fluid for your car)     Please schedule a follow up office visit in 6 weeks, call sooner if needed with all medications /inhalers/ solutions in hand so we can verify exactly what you are taking. This includes all medications from all doctors and over the counters

## 2023-12-02 LAB — CBC WITH DIFFERENTIAL/PLATELET
Basophils Absolute: 0.1 10*3/uL (ref 0.0–0.2)
Basos: 1 %
EOS (ABSOLUTE): 0.8 10*3/uL — ABNORMAL HIGH (ref 0.0–0.4)
Eos: 11 %
Hematocrit: 46.3 % (ref 37.5–51.0)
Hemoglobin: 15.2 g/dL (ref 13.0–17.7)
Immature Grans (Abs): 0 10*3/uL (ref 0.0–0.1)
Immature Granulocytes: 0 %
Lymphocytes Absolute: 2.2 10*3/uL (ref 0.7–3.1)
Lymphs: 31 %
MCH: 29.4 pg (ref 26.6–33.0)
MCHC: 32.8 g/dL (ref 31.5–35.7)
MCV: 90 fL (ref 79–97)
Monocytes Absolute: 0.7 10*3/uL (ref 0.1–0.9)
Monocytes: 9 %
Neutrophils Absolute: 3.4 10*3/uL (ref 1.4–7.0)
Neutrophils: 48 %
Platelets: 204 10*3/uL (ref 150–450)
RBC: 5.17 x10E6/uL (ref 4.14–5.80)
RDW: 14.2 % (ref 11.6–15.4)
WBC: 7.1 10*3/uL (ref 3.4–10.8)

## 2023-12-02 LAB — IGE: IgE (Immunoglobulin E), Serum: 539 [IU]/mL — ABNORMAL HIGH (ref 6–495)

## 2023-12-04 ENCOUNTER — Other Ambulatory Visit: Payer: Self-pay | Admitting: Family Medicine

## 2023-12-04 ENCOUNTER — Other Ambulatory Visit: Payer: Self-pay | Admitting: Internal Medicine

## 2023-12-04 DIAGNOSIS — J4489 Other specified chronic obstructive pulmonary disease: Secondary | ICD-10-CM

## 2023-12-04 DIAGNOSIS — J3089 Other allergic rhinitis: Secondary | ICD-10-CM

## 2023-12-04 NOTE — Progress Notes (Signed)
 Spoke with pt and notified of results per Dr. Waymond Hailey. Pt verbalized understanding and denied any questions. He was agreeable to allergy referral and this was placed.

## 2023-12-07 ENCOUNTER — Ambulatory Visit: Payer: Medicaid Other | Admitting: Family Medicine

## 2023-12-07 ENCOUNTER — Other Ambulatory Visit

## 2023-12-07 ENCOUNTER — Telehealth: Payer: Self-pay

## 2023-12-07 ENCOUNTER — Encounter: Payer: Self-pay | Admitting: Family Medicine

## 2023-12-07 ENCOUNTER — Other Ambulatory Visit: Payer: Self-pay

## 2023-12-07 ENCOUNTER — Other Ambulatory Visit: Payer: Self-pay | Admitting: Family Medicine

## 2023-12-07 VITALS — BP 144/73 | HR 78 | Temp 97.9°F | Ht 75.0 in | Wt 247.0 lb

## 2023-12-07 DIAGNOSIS — E1159 Type 2 diabetes mellitus with other circulatory complications: Secondary | ICD-10-CM

## 2023-12-07 DIAGNOSIS — J301 Allergic rhinitis due to pollen: Secondary | ICD-10-CM

## 2023-12-07 DIAGNOSIS — E119 Type 2 diabetes mellitus without complications: Secondary | ICD-10-CM

## 2023-12-07 DIAGNOSIS — E782 Mixed hyperlipidemia: Secondary | ICD-10-CM

## 2023-12-07 DIAGNOSIS — I10 Essential (primary) hypertension: Secondary | ICD-10-CM | POA: Diagnosis not present

## 2023-12-07 DIAGNOSIS — I499 Cardiac arrhythmia, unspecified: Secondary | ICD-10-CM

## 2023-12-07 LAB — BAYER DCA HB A1C WAIVED: HB A1C (BAYER DCA - WAIVED): 6.2 % — ABNORMAL HIGH (ref 4.8–5.6)

## 2023-12-07 LAB — LIPID PANEL

## 2023-12-07 MED ORDER — METFORMIN HCL 500 MG PO TABS: 500.0000 mg | ORAL_TABLET | Freq: Two times a day (BID) | ORAL | 1 refills | Status: DC

## 2023-12-07 MED ORDER — DILTIAZEM HCL ER COATED BEADS 120 MG PO CP24: 120.0000 mg | ORAL_CAPSULE | Freq: Every day | ORAL | 2 refills | Status: DC

## 2023-12-07 MED ORDER — BETAMETHASONE SOD PHOS & ACET 6 (3-3) MG/ML IJ SUSP: 6.0000 mg | Freq: Once | INTRAMUSCULAR | Status: DC

## 2023-12-07 NOTE — Telephone Encounter (Signed)
 Please let the patient know that I sent their prescription to their pharmacy. Thanks, WS

## 2023-12-07 NOTE — Telephone Encounter (Signed)
Complete  -LS

## 2023-12-07 NOTE — Progress Notes (Signed)
 Subjective:  Patient ID: Bruce Fisher, male    DOB: June 06, 1961  Age: 63 y.o. MRN: [id number]  CC: Allergic Reaction (Tested for strong allergy and being referred to allergist to figure out what it is too. Breathing is still an issue. Some cough with the SOB. Worse as of last night/this morning. )   HPI Bruce Fisher presents for recheck of hypertension.  However he has had some allergy problems.  Pulmonology saw him and did a test that said he has some type of allergy and want him to see an allergist.  Additionally he also has had some heart palpitations and was taken off of the  Diovan  HCT in the hospital and placed on Cardizem  30 mg twice daily plus the irbesartan  that he is now taking reflected on his medication record.  He had some palpitations last week and spite of this new regimen.  Blood pressure at home is remaining in the 150s primarily, systolic.  He is having more congestion over the last few days his cough and shortness of breath had improved after hospitalization with using prednisone  primarily.  Of note is that his pulmonology workup was negative for COPD etc.  However his symptoms began to increase again about 5 or 6 days ago with increased cough and shortness of breath.  He had an episode of palpitations ago.     09/20/2023    8:34 AM 08/24/2023    1:51 PM 06/06/2023   10:01 AM  Depression screen PHQ 2/9  Decreased Interest 0 0 0  Down, Depressed, Hopeless 0 0 0  PHQ - 2 Score 0 0 0    History Bruce Fisher has a past medical history of COPD (chronic obstructive pulmonary disease) (HCC), Diabetes mellitus without complication (HCC), Hyperlipidemia, Hypertension, Kidney stones, Rectal spasm, and Renal disorder.   He has a past surgical history that includes Spine surgery.   His family history includes Cancer in his father and sister; Diabetes in his mother; Heart disease in his sister; Heart failure in his mother.He reports that he quit smoking about 30 years ago. His smoking use  included cigarettes. He has never used smokeless tobacco. He reports that he does not drink alcohol and does not use drugs.    ROS Review of Systems  Constitutional:  Negative for fever.  Respiratory:  Positive for cough and shortness of breath.   Cardiovascular:  Positive for palpitations and leg swelling (while taking diovan  HCT). Negative for chest pain.  Musculoskeletal:  Negative for arthralgias.  Skin:  Negative for rash.    Objective:  BP (!) 144/73   Pulse 78   Temp 97.9 F (36.6 C)   Ht 6\' 3"  (1.905 m)   Wt 247 lb (112 kg)   SpO2 94%   BMI 30.87 kg/m   BP Readings from Last 3 Encounters:  12/07/23 (!) 144/73  11/30/23 (!) 156/85  10/24/23 107/69    Wt Readings from Last 3 Encounters:  12/07/23 247 lb (112 kg)  11/30/23 245 lb 3.2 oz (111.2 kg)  10/23/23 239 lb 13.8 oz (108.8 kg)     Physical Exam Vitals reviewed.  Constitutional:      Appearance: He is well-developed.  HENT:     Head: Normocephalic and atraumatic.     Right Ear: External ear normal.     Left Ear: External ear normal.     Mouth/Throat:     Pharynx: No oropharyngeal exudate or posterior oropharyngeal erythema.  Eyes:     Pupils: Pupils are equal,  round, and reactive to light.  Cardiovascular:     Rate and Rhythm: Normal rate and regular rhythm.     Heart sounds: No murmur heard. Pulmonary:     Effort: No respiratory distress.     Breath sounds: Normal breath sounds.  Musculoskeletal:     Cervical back: Normal range of motion and neck supple.  Neurological:     Mental Status: He is alert and oriented to person, place, and time.      Assessment & Plan:  Essential hypertension -     CBC with Differential/Platelet -     LONG TERM MONITOR (3-14 DAYS); Future  Irregular heart rhythm -     CMP14+EGFR -     CBC with Differential/Platelet -     LONG TERM MONITOR (3-14 DAYS); Future  Type 2 diabetes mellitus without complication, without long-term current use of insulin  (HCC) -      Bayer DCA Hb A1c Waived -     LONG TERM MONITOR (3-14 DAYS); Future  Mixed hyperlipidemia -     CMP14+EGFR -     Lipid panel -     LONG TERM MONITOR (3-14 DAYS); Future  Seasonal allergic rhinitis due to pollen -     Betamethasone  Sod Phos & Acet  Other orders -     dilTIAZem  HCl ER Coated Beads; Take 1 capsule (120 mg total) by mouth daily.  Dispense: 30 capsule; Refill: 2     Follow-up: No follow-ups on file.  Roise Cleaver, M.D.

## 2023-12-07 NOTE — Telephone Encounter (Signed)
 Pt requested a refill of all meds.  Metformin  just refilled but said that the script needs to be adjusted to "take 2 tablets daily" because that is was he was told to take and running out too quickly.

## 2023-12-08 ENCOUNTER — Telehealth: Payer: Self-pay

## 2023-12-08 LAB — CBC WITH DIFFERENTIAL/PLATELET
Basophils Absolute: 0.1 10*3/uL (ref 0.0–0.2)
Basos: 1 %
EOS (ABSOLUTE): 1 10*3/uL — ABNORMAL HIGH (ref 0.0–0.4)
Eos: 11 %
Hematocrit: 45.8 % (ref 37.5–51.0)
Hemoglobin: 14.6 g/dL (ref 13.0–17.7)
Immature Grans (Abs): 0 10*3/uL (ref 0.0–0.1)
Immature Granulocytes: 0 %
Lymphocytes Absolute: 2.1 10*3/uL (ref 0.7–3.1)
Lymphs: 24 %
MCH: 28.9 pg (ref 26.6–33.0)
MCHC: 31.9 g/dL (ref 31.5–35.7)
MCV: 91 fL (ref 79–97)
Monocytes Absolute: 0.9 10*3/uL (ref 0.1–0.9)
Monocytes: 10 %
Neutrophils Absolute: 4.7 10*3/uL (ref 1.4–7.0)
Neutrophils: 54 %
Platelets: 182 10*3/uL (ref 150–450)
RBC: 5.05 x10E6/uL (ref 4.14–5.80)
RDW: 14.4 % (ref 11.6–15.4)
WBC: 8.8 10*3/uL (ref 3.4–10.8)

## 2023-12-08 LAB — CMP14+EGFR
ALT: 20 IU/L (ref 0–44)
AST: 22 IU/L (ref 0–40)
Albumin: 4.3 g/dL (ref 3.8–4.9)
Alkaline Phosphatase: 62 IU/L (ref 44–121)
BUN/Creatinine Ratio: 13 (ref 10–24)
BUN: 13 mg/dL (ref 8–27)
Bilirubin Total: 0.2 mg/dL (ref 0.0–1.2)
CO2: 23 mmol/L (ref 20–29)
Calcium: 9.5 mg/dL (ref 8.6–10.2)
Chloride: 105 mmol/L (ref 96–106)
Creatinine, Ser: 1.02 mg/dL (ref 0.76–1.27)
Globulin, Total: 2.6 g/dL (ref 1.5–4.5)
Glucose: 117 mg/dL — ABNORMAL HIGH (ref 70–99)
Potassium: 4.2 mmol/L (ref 3.5–5.2)
Sodium: 144 mmol/L (ref 134–144)
Total Protein: 6.9 g/dL (ref 6.0–8.5)
eGFR: 84 mL/min/{1.73_m2} (ref >59)

## 2023-12-08 LAB — LIPID PANEL
Cholesterol, Total: 141 mg/dL (ref 100–199)
HDL: 41 mg/dL (ref >39)
LDL CALC COMMENT:: 3.4 ratio (ref 0.0–5.0)
LDL Chol Calc (NIH): 74 mg/dL (ref 0–99)
Triglycerides: 151 mg/dL — ABNORMAL HIGH (ref 0–149)
VLDL Cholesterol Cal: 26 mg/dL (ref 5–40)

## 2023-12-08 NOTE — Telephone Encounter (Signed)
 Pt advised to call [employer] to get a replacement monitor and then let us  know once he receives it so we can put the new one on and pt voiced understanding.

## 2023-12-08 NOTE — Telephone Encounter (Signed)
 Copied from CRM [id number]. Topic: General - Other >> Dec 08, 2023 10:03 AM Retta Caster wrote: Reason for CRM: Patient stated had app 05/15 and was given from the office Holter Monitor for 14 days. He stated the sticky part came off and will not stick anymore. Needs replacement ASAP. Call back [phone]

## 2023-12-10 ENCOUNTER — Ambulatory Visit: Payer: Self-pay | Admitting: Family Medicine

## 2023-12-10 NOTE — Progress Notes (Signed)
Hello [name],  Your lab result is normal and/or stable.Some minor variations that are not significant are commonly marked abnormal, but do not represent any medical problem for you.  Best regards, Mechele Claude, M.D.

## 2023-12-21 ENCOUNTER — Ambulatory Visit

## 2023-12-28 ENCOUNTER — Other Ambulatory Visit: Payer: Self-pay

## 2023-12-28 ENCOUNTER — Ambulatory Visit (INDEPENDENT_AMBULATORY_CARE_PROVIDER_SITE_OTHER)

## 2023-12-28 ENCOUNTER — Other Ambulatory Visit

## 2023-12-28 DIAGNOSIS — I499 Cardiac arrhythmia, unspecified: Secondary | ICD-10-CM

## 2023-12-28 NOTE — Addendum Note (Signed)
 Addended by: Vinetta Greening on: 12/28/2023 10:28 AM   Modules accepted: Orders

## 2023-12-28 NOTE — Progress Notes (Signed)
 Patient presented to office for Zio placement, he states that our office called him and said the Zio company called here and advised that we use one of our Zio's that we have in stock to replace the one that fell off. Could not find any documentation about this in patients chart but spoke with PCP who said "that the company report did state for patient to come to our office and have Zio replaced with one of our devices." New Zio placed and instructions given, billed accordingly.

## 2023-12-29 NOTE — Progress Notes (Signed)
 Monitor 12/28/23 out of [facility] stock. Call placed to [facility] and patient will not be charged for first failed monitor. Reference # [id number].

## 2024-01-07 ENCOUNTER — Other Ambulatory Visit: Payer: Self-pay | Admitting: Family Medicine

## 2024-01-18 ENCOUNTER — Other Ambulatory Visit: Payer: Self-pay | Admitting: Family Medicine

## 2024-01-18 MED ORDER — IRBESARTAN 150 MG PO TABS: 150.0000 mg | ORAL_TABLET | Freq: Every day | ORAL | 0 refills | Status: DC

## 2024-01-18 NOTE — Telephone Encounter (Signed)
 Copied from CRM [id number]. Topic: Clinical - Medication Refill >> Jan 18, 2024 12:23 PM Zebedee SAUNDERS wrote: Medication: irbesartan  (AVAPRO ) 150 MG tablet  Has the patient contacted their pharmacy? Yes (Agent: If no, request that the patient contact the pharmacy for the refill. If patient does not wish to contact the pharmacy document the reason why and proceed with request.) (Agent: If yes, when and what did the pharmacy advise?)Pharmacy need PCP approval   This is the patient's preferred pharmacy:  [id number] - [place], [place] - [address] [place] [address] Phone: [phone] Fax: [phone]  Is this the correct pharmacy for this prescription? Yes If no, delete pharmacy and type the correct one.   Has the prescription been filled recently? Yes  Is the patient out of the medication? Yes  Has the patient been seen for an appointment in the last year OR does the patient have an upcoming appointment? Yes  Can we respond through MyChart? Yes  Agent: Please be advised that Rx refills may take up to 3 business days. We ask that you follow-up with your pharmacy.

## 2024-01-22 NOTE — Progress Notes (Deleted)
 Bruce Fisher, male    DOB: February 23, 1961    MRN: [id number]   Brief patient profile:  62  yobm  1995 s resp sequelae  referred to pulmonary clinic in [place]  11/30/2023 by [facility]  s/p admit:   Pt not previously seen by PCCM service.    Onset of bad rhinitis esp in am's around 2022-23 year round responsive to prednisone    CT sinus 09/22/23 1. Pansinusitis, appears to be acute on chronic. All paranasal sinuses affected and sinus drainage pathways opacified. But no complicating features are identified. 2. Associated Rhinitis.  Nasal septum intact   Admit date:     10/23/2023  Discharge date: 10/24/23  Discharge Physician: Eric Nunnery    PCP: [name], MD    Recommendations at discharge:  Repeat basic metabolic panel to follow electrolytes and renal function Reassess blood pressure and adjust medication as needed Continue to closely follow patient's CBGs/A1c fluctuation and further adjust hypoglycemic regimen as needed. Make sure patient follow-up with cardiology service and pulmonologist as instructed.   Discharge Diagnoses: Principal Problem:   COPD exacerbation (HCC)   Type 2 diabetes mellitus without complication, without long-term current use of insulin  (HCC)   Acute respiratory failure with hypoxia (HCC)   Essential hypertension   Dyslipidemia      History of Present Illness  11/30/2023  Pulmonary/ 1st office eval/ Yancy Knoble / [place] [facility]  Since Aprl 15 th finished pred maint on breyna  Chief Complaint  Patient presents with   COPD  Dyspnea:  yardwork ok Cough: none in am, some daytime cough white  Sleep: flat bed, 3 pillows under head /  SABA use: not using  02: none  Rec Call me with the name of your inhalers Plan A = Automatic = Always=    Breyna  160 Take 2 puffs first thing in am and then another 2 puffs about 12 hours later (pm dose is optional)  Work on inhaler technique:   Plan B = Backup (to supplement plan A, not to replace it) Only use your  albuterol  inhaler as a rescue medication Please schedule a follow up office visit in 6 weeks, call sooner if needed with all medications /inhalers/ solutions in hand    01/25/2024  f/u ov/[place] office/Chika Cichowski re: *** maint on *** did *** bring meds  No chief complaint on file.   Dyspnea:  *** Cough: *** Sleeping: ***   resp cc  SABA use: *** 02: ***  Lung cancer screening: ***   No obvious day to day or daytime variability or assoc excess/ purulent sputum or mucus plugs or hemoptysis or cp or chest tightness, subjective wheeze or overt sinus or hb symptoms.    Also denies any obvious fluctuation of symptoms with weather or environmental changes or other aggravating or alleviating factors except as outlined above   No unusual exposure hx or h/o childhood pna/ asthma or knowledge of premature birth.  Current Allergies, Complete Past Medical History, Past Surgical History, Family History, and Social History were reviewed in [employer] record.  ROS  The following are not active complaints unless bolded Hoarseness, sore throat, dysphagia, dental problems, itching, sneezing,  nasal congestion or discharge of excess mucus or purulent secretions, ear ache,   fever, chills, sweats, unintended wt loss or wt gain, classically pleuritic or exertional cp,  orthopnea pnd or arm/hand swelling  or leg swelling, presyncope, palpitations, abdominal pain, anorexia, nausea, vomiting, diarrhea  or change in bowel habits or change in bladder habits,  change in stools or change in urine, dysuria, hematuria,  rash, arthralgias, visual complaints, headache, numbness, weakness or ataxia or problems with walking or coordination,  change in mood or  memory.        No outpatient medications have been marked as taking for the 01/25/24 encounter (Appointment) with Darlean Ozell NOVAK, MD.   Current Facility-Administered Medications for the 01/25/24 encounter (Appointment) with Darlean Ozell NOVAK, MD   Medication   betamethasone  acetate-betamethasone  sodium phosphate (CELESTONE ) injection 6 mg               Past Medical History:  Diagnosis Date   COPD (chronic obstructive pulmonary disease) (HCC)    Diabetes mellitus without complication (HCC)    Hyperlipidemia    Hypertension    Kidney stones    Rectal spasm    Renal disorder    kidney stones      Objective:    01/25/2024         ***   12/07/23 247 lb (112 kg)  11/30/23 245 lb 3.2 oz (111.2 kg)  10/23/23 239 lb 13.8 oz (108.8 kg)      Vital signs reviewed  01/25/2024  - Note at rest 02 sats  ***% on ***   General appearance:    ***        Assessment

## 2024-01-24 ENCOUNTER — Ambulatory Visit (INDEPENDENT_AMBULATORY_CARE_PROVIDER_SITE_OTHER): Admitting: Allergy & Immunology

## 2024-01-24 ENCOUNTER — Encounter: Payer: Self-pay | Admitting: Allergy & Immunology

## 2024-01-24 VITALS — BP 154/90 | HR 55 | Temp 98.7°F | Resp 20 | Ht 72.84 in | Wt 248.1 lb

## 2024-01-24 DIAGNOSIS — J310 Chronic rhinitis: Secondary | ICD-10-CM

## 2024-01-24 DIAGNOSIS — J4540 Moderate persistent asthma, uncomplicated: Secondary | ICD-10-CM

## 2024-01-24 NOTE — Patient Instructions (Addendum)
 1. Moderate persistent asthma, uncomplicated - Lung testing was in the 60% range and it did NOT really improve with the albuterol  treatment.  - I would recommend continuing with the Symbicort  for now until we put some more pieces together. - I think that the testing will answer some questions.  - I am going to chat with Dr. Darlean as well to see what he thinks.  - Spacer sample and demonstration provided. - Daily controller medication(s): Symbicort  160/4.75mcg two puffs twice daily with spacer - Prior to physical activity: albuterol  2 puffs 10-15 minutes before physical activity. - Rescue medications: albuterol  4 puffs every 4-6 hours as needed - Asthma control goals:  * Full participation in all desired activities (may need albuterol  before activity) * Albuterol  use two time or less a week on average (not counting use with activity) * Cough interfering with sleep two time or less a month * Oral steroids no more than once a year * No hospitalizations  2. Chronic rhinitis - Because of insurance stipulations, we cannot do skin testing on the same day as your first visit. - We are all working to fight this, but for now we need to do two separate visits.  - We will know more after we do testing at the next visit.  - The skin testing visit can be squeezed in at your convenience.  - Then we can make a more full plan to address all of your symptoms. - Be sure to stop your antihistamines for 3 days before this appointment.   3. Return in about 1 week (around 01/31/2024) for SKIN TESTING (1-55). You can have the follow up appointment with Dr. Iva or a Nurse Practicioner (our Nurse Practitioners are excellent and always have Physician oversight!).    Please inform us  of any Emergency Department visits, hospitalizations, or changes in symptoms. Call us  before going to the ED for breathing or allergy symptoms since we might be able to fit you in for a sick visit. Feel free to contact us  anytime with  any questions, problems, or concerns.  It was a pleasure to meet you today!  Websites that have reliable patient information: 1. American Academy of Asthma, Allergy, and Immunology: www.aaaai.org 2. Food Allergy Research and Education (FARE): foodallergy.org 3. Mothers of Asthmatics: http://www.asthmacommunitynetwork.org 4. American College of Allergy, Asthma, and Immunology: www.acaai.org      "Like" us  on Facebook and Instagram for our latest updates!      A healthy democracy works best when [employer] participate! Make sure you are registered to vote! If you have moved or changed any of your contact information, you will need to get this updated before voting! Scan the QR codes below to learn more!

## 2024-01-24 NOTE — Progress Notes (Unsigned)
 NEW PATIENT  Date of Service/Encounter:  01/24/24  Consult requested by: [name], MD   Assessment:   Shortness of breath   Chronic rhinitis - planning skin testing at the next visit  Plan/Recommendations:   1. Moderate persistent asthma, uncomplicated - Lung testing was in the 60% range and it did NOT really improve with the albuterol  treatment.  - I would recommend continuing with the Symbicort  for now until we put some more pieces together. - I think that the testing will answer some questions.  - I am going to chat with Dr. Darlean as well to see what he thinks.  - Spacer sample and demonstration provided. - Daily controller medication(s): Symbicort  160/4.61mcg two puffs twice daily with spacer - Prior to physical activity: albuterol  2 puffs 10-15 minutes before physical activity. - Rescue medications: albuterol  4 puffs every 4-6 hours as needed - Asthma control goals:  * Full participation in all desired activities (may need albuterol  before activity) * Albuterol  use two time or less a week on average (not counting use with activity) * Cough interfering with sleep two time or less a month * Oral steroids no more than once a year * No hospitalizations  2. Chronic rhinitis - Because of insurance stipulations, we cannot do skin testing on the same day as your first visit. - We are all working to fight this, but for now we need to do two separate visits.  - We will know more after we do testing at the next visit.  - The skin testing visit can be squeezed in at your convenience.  - Then we can make a more full plan to address all of your symptoms. - Be sure to stop your antihistamines for 3 days before this appointment.   3. Return in about 1 week (around 01/31/2024) for SKIN TESTING (1-55). You can have the follow up appointment with Dr. Iva or a Nurse Practicioner (our Nurse Practitioners are excellent and always have Physician oversight!).    This note in its  entirety was forwarded to the Provider who requested this consultation.  Subjective:   Bruce Fisher is a 63 y.o. male presenting today for evaluation of  Chief Complaint  Patient presents with   Allergic Rhinitis     Nose running, sneezing    Breathing Problem    Bruce Fisher has a history of the following: Patient Active Problem List   Diagnosis Date Noted   Asthmatic bronchitis , chronic (HCC) 11/30/2023   SVT (supraventricular tachycardia) (HCC) 11/22/2023   COPD exacerbation (HCC) 10/23/2023   Pansinusitis 09/23/2023   acute on chronic sinusitis 09/23/2023   Acute respiratory failure with hypoxia (HCC) 09/22/2023   History of postnasal drip 09/22/2023   Prolonged QT interval 09/22/2023   Type 2 diabetes mellitus with hyperglycemia (HCC) 09/22/2023   Acute exacerbation of chronic obstructive pulmonary disease (COPD) (HCC) 09/21/2023   Perennial allergic rhinitis 06/22/2022   Type 2 diabetes mellitus without complication, without long-term current use of insulin  (HCC) 06/07/2021   Irregular heart rhythm 06/07/2021   PVC's (premature ventricular contractions) 05/28/2019   Dyslipidemia 05/28/2019   Multiple kidney stones 02/21/2019   Ventricular bigeminy 10/24/2018   Grade I diastolic dysfunction 10/24/2018   GERD (gastroesophageal reflux disease) 05/19/2015   Erectile dysfunction 03/23/2015   Mixed hyperlipidemia    Essential hypertension     History obtained from: chart review and patient.  Discussed the use of AI scribe software for clinical note transcription with the patient and/or guardian,  who gave verbal consent to proceed.  Bruce Fisher was referred by [name], MD.     Bruce Fisher is a 63 y.o. male presenting for an evaluation of environmental allergies.  Asthma/Respiratory Symptom History: He is followed by Dr. Darlean. He has a history of COPD. He has been admitted twice. He is doing Symbicort  two puffs twice daily; but this has caused a cough according  to the patient. He reports a dry hacking cough for 7 days. Symbicort  has been helpful for maintaining breathing and alleviating shortness of breath, although he is experiencing a cough that makes him want to stop the medication. Per the patient, Dr. Darlean was informed that his blood work indicated strong allergies. He has experienced worsening symptoms over the past three to four years.   During a recent couple of hospitalizations, he was treated with breathing treatments and started on albuterol , which was later changed to Symbicort . He currently uses Symbicort , two puffs in the morning and two in the evening, which has improved his breathing but caused a persistent dry, hacking cough for the past seven days. No fever is present, and the cough is described as dry.   He has a history of smoking, which he quit in the 1990s, and was previously labeled with COPD during a March hospitalization.   Chest CT (Feb 2025): FINDINGS: Evaluation of this exam is limited in the absence of intravenous contrast.   Cardiovascular: There is no cardiomegaly or pericardial effusion. Mild atherosclerotic calcification of the thoracic aorta. Mildly dilated aortic isthmus measuring 3.7 cm in diameter. The central pulmonary arteries appear unremarkable.   Mediastinum/Nodes: No hilar or mediastinal adenopathy. The esophagus is grossly unremarkable. No mediastinal fluid collection.   Lungs/Pleura: No focal consolidation, pleural effusion, or pneumothorax. Mild bronchial thickening may represent reactive airway disease/bronchitis. Tiny right lower lobe calcified granuloma. The central airways are patent.   Upper Abdomen: No acute abnormality.   Musculoskeletal: Degenerative changes of the spine. No acute osseous pathology.   IMPRESSION: 1. No acute intrathoracic pathology. 2. Mild bronchial thickening may represent reactive airway disease/bronchitis.   Allergic Rhinitis Symptom History: He reports copious drainage.  He has experienced a progressive history of nasal symptoms over the past few years, characterized by morning nasal drainage and sneezing. Initially, these symptoms seemed seasonal but have persisted beyond typical allergy seasons. This year, significant nasal drainage led to two hospitalizations due to lung inflammation and breathing difficulties. He denies any exposure to chemicals or environmental allergens at home.   A sinus CT showed a sinus infection, and he reports chronic sinus issues with redness and clogged pores.  Sinus CT (Feb 2025): 1. Pansinusitis, appears to be acute on chronic. All paranasal sinuses affected and sinus drainage pathways opacified. But no complicating features are identified. 2. Associated Rhinitis.  Nasal septum intact. 3. Otherwise negative noncontrast CT appearance of the visible face, brain.  Otherwise, there is no history of other atopic diseases, including drug allergies, stinging insect allergies, or contact dermatitis. There is no significant infectious history. Vaccinations are up to date.    Past Medical History: Patient Active Problem List   Diagnosis Date Noted   Asthmatic bronchitis , chronic (HCC) 11/30/2023   SVT (supraventricular tachycardia) (HCC) 11/22/2023   COPD exacerbation (HCC) 10/23/2023   Pansinusitis 09/23/2023   acute on chronic sinusitis 09/23/2023   Acute respiratory failure with hypoxia (HCC) 09/22/2023   History of postnasal drip 09/22/2023   Prolonged QT interval 09/22/2023   Type 2 diabetes mellitus with hyperglycemia (  HCC) 09/22/2023   Acute exacerbation of chronic obstructive pulmonary disease (COPD) (HCC) 09/21/2023   Perennial allergic rhinitis 06/22/2022   Type 2 diabetes mellitus without complication, without long-term current use of insulin  (HCC) 06/07/2021   Irregular heart rhythm 06/07/2021   PVC's (premature ventricular contractions) 05/28/2019   Dyslipidemia 05/28/2019   Multiple kidney stones 02/21/2019    Ventricular bigeminy 10/24/2018   Grade I diastolic dysfunction 10/24/2018   GERD (gastroesophageal reflux disease) 05/19/2015   Erectile dysfunction 03/23/2015   Mixed hyperlipidemia    Essential hypertension     Medication List:  Allergies as of 01/24/2024   No Known Allergies      Medication List        Accurate as of January 24, 2024 11:59 PM. If you have any questions, ask your nurse or doctor.          albuterol  108 (90 Base) MCG/ACT inhaler Commonly known as: VENTOLIN  HFA Inhale 2 puffs into the lungs every 4 (four) hours as needed for wheezing or shortness of breath.   aspirin EC 81 MG tablet Take 81 mg by mouth daily. Swallow whole.   benazepril  40 MG tablet Commonly known as: LOTENSIN  Take 40 mg by mouth daily.   budesonide -formoterol  160-4.5 MCG/ACT inhaler Commonly known as: Symbicort  Inhale 2 puffs into the lungs 2 times daily at 12 noon and 4 pm.   Co Q-10 100 MG Caps Take 100 mg by mouth daily.   diltiazem  120 MG 24 hr capsule Commonly known as: Cardizem  CD Take 1 capsule (120 mg total) by mouth daily.   diltiazem  30 MG tablet Commonly known as: Cardizem  Take 1 tablet (30 mg total) by mouth 2 (two) times daily.   irbesartan  150 MG tablet Commonly known as: AVAPRO  Take 1 tablet (150 mg total) by mouth daily.   loratadine  10 MG tablet Commonly known as: CLARITIN  Take 1 tablet (10 mg total) by mouth daily.   metFORMIN  500 MG tablet Commonly known as: GLUCOPHAGE  Take 1 tablet by mouth once daily with breakfast   multivitamin with minerals Tabs tablet Take 1 tablet by mouth daily.   pantoprazole  40 MG tablet Commonly known as: Protonix  Take 1 tablet (40 mg total) by mouth daily.   potassium chloride  SA 20 MEQ tablet Commonly known as: KLOR-CON  M Take 2 tablets (40 mEq total) by mouth 2 (two) times daily for 3 days.   predniSONE  20 MG tablet Commonly known as: DELTASONE  Take 3 tablets by mouth daily x 1 day; then 2 tablets by mouth daily x  2 days; then 1 tablet by mouth daily x 3 days; then half tablet by mouth daily x 3 days and stop prednisone .   rosuvastatin  20 MG tablet Commonly known as: CRESTOR  Take 1 tablet (20 mg total) by mouth daily. for cholesterol.   triamcinolone  55 MCG/ACT Aero nasal inhaler Commonly known as: NASACORT  Place 2 sprays into the nose at bedtime.   VITAMIN D  PO Take 1 tablet by mouth daily.        Birth History: non-contributory  Developmental History: non-contributory  Past Surgical History: Past Surgical History:  Procedure Laterality Date   SPINE SURGERY     lower back     Family History: Family History  Problem Relation Age of Onset   Diabetes Mother    Heart failure Mother    Cancer Father        Asbestos   Cancer Sister        breast   Heart disease Sister  valve   Allergic rhinitis Neg Hx      Social History: Bruce Fisher lives at home with his family.  He lives in a house that is 61 years old.  There is carpeting in linoleum in the living areas and carpeting in the bedroom.  They have gas heating and central cooling.  There are no dust mite covers on the bedding.  There is no tobacco exposure.  He is not exposed to fumes, chemicals, or dust.  He smoked for 6 years, but quit in 1990s.   Review of systems otherwise negative other than that mentioned in the HPI.    Objective:   Blood pressure (!) 154/90, pulse (!) 55, temperature 98.7 F (37.1 C), resp. rate 20, height 6' 0.84 (1.85 m), weight 248 lb 2 oz (112.5 kg), SpO2 93%. Body mass index is 32.88 kg/m.     Physical Exam Vitals reviewed.  Constitutional:      Appearance: He is well-developed.  HENT:     Head: Normocephalic and atraumatic.     Right Ear: Tympanic membrane, ear canal and external ear normal. No drainage, swelling or tenderness. Tympanic membrane is not injected, scarred, erythematous, retracted or bulging.     Left Ear: Tympanic membrane, ear canal and external ear normal. No  drainage, swelling or tenderness. Tympanic membrane is not injected, scarred, erythematous, retracted or bulging.     Nose: No nasal deformity, septal deviation, mucosal edema or rhinorrhea.     Right Turbinates: Enlarged, swollen and pale.     Left Turbinates: Enlarged, swollen and pale.     Right Sinus: No maxillary sinus tenderness or frontal sinus tenderness.     Left Sinus: No maxillary sinus tenderness or frontal sinus tenderness.     Mouth/Throat:     Mouth: Mucous membranes are not pale and not dry.     Pharynx: Uvula midline.  Eyes:     General:        Right eye: No discharge.        Left eye: No discharge.     Conjunctiva/sclera: Conjunctivae normal.     Right eye: Right conjunctiva is not injected. No chemosis.    Left eye: Left conjunctiva is not injected. No chemosis.    Pupils: Pupils are equal, round, and reactive to light.  Cardiovascular:     Rate and Rhythm: Normal rate and regular rhythm.     Heart sounds: Normal heart sounds.  Pulmonary:     Effort: Pulmonary effort is normal. No tachypnea, accessory muscle usage or respiratory distress.     Breath sounds: Normal breath sounds. No wheezing, rhonchi or rales.  Chest:     Chest wall: No tenderness.  Abdominal:     Tenderness: There is no abdominal tenderness. There is no guarding or rebound.  Lymphadenopathy:     Head:     Right side of head: No submandibular, tonsillar or occipital adenopathy.     Left side of head: No submandibular, tonsillar or occipital adenopathy.     Cervical: No cervical adenopathy.  Skin:    Coloration: Skin is not pale.     Findings: No abrasion, erythema, petechiae or rash. Rash is not papular, urticarial or vesicular.  Neurological:     Mental Status: He is alert.  Psychiatric:        Behavior: Behavior is cooperative.      Diagnostic studies:    Spirometry: results abnormal (FEV1: 1.93/60%, FVC: 2.81/67%, FEV1/FVC: 69%).    Spirometry consistent with possible restrictive  disease. Four puffs of Xopenex  given with improvement in the FVC, but not significant per ATS criteria.  Allergy Studies: deferred due to insurance stipulations that require a separate visit for testing         Marty Shaggy, MD [facility] 

## 2024-01-25 ENCOUNTER — Ambulatory Visit: Admitting: Internal Medicine

## 2024-01-25 ENCOUNTER — Encounter: Payer: Self-pay | Admitting: Internal Medicine

## 2024-01-25 ENCOUNTER — Encounter: Payer: Self-pay | Admitting: Allergy & Immunology

## 2024-01-29 ENCOUNTER — Encounter: Payer: Self-pay | Admitting: Nurse Practitioner

## 2024-01-29 ENCOUNTER — Ambulatory Visit: Admitting: Nurse Practitioner

## 2024-01-29 VITALS — BP 142/86 | HR 72 | Temp 97.2°F | Ht 72.0 in | Wt 247.0 lb

## 2024-01-29 DIAGNOSIS — R051 Acute cough: Secondary | ICD-10-CM | POA: Diagnosis not present

## 2024-01-29 MED ORDER — PREDNISONE 20 MG PO TABS: 40.0000 mg | ORAL_TABLET | Freq: Every day | ORAL | 0 refills | Status: AC

## 2024-01-29 MED ORDER — BENZONATATE 100 MG PO CAPS: 100.0000 mg | ORAL_CAPSULE | Freq: Three times a day (TID) | ORAL | 0 refills | Status: DC | PRN

## 2024-01-29 NOTE — Patient Instructions (Signed)

## 2024-01-29 NOTE — Progress Notes (Signed)
 Subjective:    Patient ID: Bruce Fisher, male    DOB: 1961/05/23, 63 y.o.   MRN: [id number]   Chief Complaint: Cough (Thinks his inhalers that he has been using have give him this cough/)   Cough This is a new problem. The current episode started in the past 7 days. The problem has been waxing and waning. The problem occurs every few minutes. Cough characteristics: productive only at times. Pertinent negatives include no ear congestion, ear pain, fever, rhinorrhea, sore throat or shortness of breath. Nothing aggravates the symptoms. He has tried a beta-agonist inhaler for the symptoms. The treatment provided mild relief.   He saw pulmonologist the first of year and was started on symbicort - he stopped it several days ago.   Patient Active Problem List   Diagnosis Date Noted   Asthmatic bronchitis , chronic (HCC) 11/30/2023   SVT (supraventricular tachycardia) (HCC) 11/22/2023   COPD exacerbation (HCC) 10/23/2023   Pansinusitis 09/23/2023   acute on chronic sinusitis 09/23/2023   Acute respiratory failure with hypoxia (HCC) 09/22/2023   History of postnasal drip 09/22/2023   Prolonged QT interval 09/22/2023   Type 2 diabetes mellitus with hyperglycemia (HCC) 09/22/2023   Acute exacerbation of chronic obstructive pulmonary disease (COPD) (HCC) 09/21/2023   Perennial allergic rhinitis 06/22/2022   Type 2 diabetes mellitus without complication, without long-term current use of insulin  (HCC) 06/07/2021   Irregular heart rhythm 06/07/2021   PVC's (premature ventricular contractions) 05/28/2019   Dyslipidemia 05/28/2019   Multiple kidney stones 02/21/2019   Ventricular bigeminy 10/24/2018   Grade I diastolic dysfunction 10/24/2018   GERD (gastroesophageal reflux disease) 05/19/2015   Erectile dysfunction 03/23/2015   Mixed hyperlipidemia    Essential hypertension        Review of Systems  Constitutional:  Negative for fever.  HENT:  Negative for ear pain, rhinorrhea and sore  throat.   Respiratory:  Positive for cough. Negative for shortness of breath.        Objective:   Physical Exam Constitutional:      Appearance: Normal appearance. He is obese.  Cardiovascular:     Rate and Rhythm: Normal rate and regular rhythm.     Heart sounds: Normal heart sounds.  Pulmonary:     Effort: Pulmonary effort is normal.     Breath sounds: Wheezing (tight exp wheezes in bill lower lobes) present.  Skin:    General: Skin is warm.  Neurological:     General: No focal deficit present.     Mental Status: He is alert and oriented to person, place, and time.  Psychiatric:        Mood and Affect: Mood normal.        Behavior: Behavior normal.       BP (!) 142/86   Pulse 72   Temp (!) 97.2 F (36.2 C) (Temporal)   Ht 6' (1.829 m)   Wt 247 lb (112 kg)   SpO2 90%   BMI 33.50 kg/m       Assessment & Plan:   Bruce Fisher in today with chief complaint of Cough (Thinks his inhalers that he has been using have give him this cough/)   1. Acute cough (Primary) Need to go back on symbicort  Force fluids Keep diary of blood pressure Keep follow up with PCP Blood sugars may go up while on steroids Meds ordered this encounter  Medications   predniSONE  (DELTASONE ) 20 MG tablet    Sig: Take 2 tablets (40 mg total) by  mouth daily with breakfast for 5 days. 2 po daily for 5 days    Dispense:  10 tablet    Refill:  0    Supervising Provider:   MARYANNE CHEW A [[id number]]   benzonatate  (TESSALON  PERLES) 100 MG capsule    Sig: Take 1 capsule (100 mg total) by mouth 3 (three) times daily as needed for cough.    Dispense:  20 capsule    Refill:  0    Supervising Provider:   MARYANNE CHEW A [[id number]]     The above assessment and management plan was discussed with the patient. The patient verbalized understanding of and has agreed to the management plan. Patient is aware to call the clinic if symptoms persist or worsen. Patient is aware when to return to the  clinic for a follow-up visit. Patient educated on when it is appropriate to go to the emergency department.   Mary-Margaret Gladis, FNP

## 2024-02-02 ENCOUNTER — Ambulatory Visit: Admitting: Allergy & Immunology

## 2024-02-05 ENCOUNTER — Ambulatory Visit (INDEPENDENT_AMBULATORY_CARE_PROVIDER_SITE_OTHER): Admitting: Allergy & Immunology

## 2024-02-05 DIAGNOSIS — J4540 Moderate persistent asthma, uncomplicated: Secondary | ICD-10-CM

## 2024-02-05 NOTE — Patient Instructions (Addendum)
 1. Moderate persistent asthma, uncomplicated - Lung testing not done today. - We are not going to make any changes at all for your breathing.  - Daily controller medication(s): Symbicort  160/4.47mcg two puffs twice daily with spacer - Prior to physical activity: albuterol  2 puffs 10-15 minutes before physical activity. - Rescue medications: albuterol  4 puffs every 4-6 hours as needed - Asthma control goals:  * Full participation in all desired activities (may need albuterol  before activity) * Albuterol  use two time or less a week on average (not counting use with activity) * Cough interfering with sleep two time or less a month * Oral steroids no more than once a year * No hospitalizations  2. Chronic rhinitis - Testing today showed: indoor molds, outdoor molds, and cockroach - Copy of test results provided.  - Avoidance measures provided. - Stop taking: Flonase   - Continue taking: Astelin  - Start taking: carbinoxamine 4mg  tablet 1-2 times daily as needed and Xhance  (fluticasone ) 1-2 sprays per nostril daily  - Xhance  sample provided today to see if this helps (to decrease your mucous production).  - You can use an extra dose of the antihistamine, if needed, for breakthrough symptoms.  - Consider nasal saline rinses 1-2 times daily to remove allergens from the nasal cavities as well as help with mucous clearance (this is especially helpful to do before the nasal sprays are given) - Consider allergy  shots as a means of long-term control. - Allergy  shots re-train and reset the immune system to ignore environmental allergens and decrease the resulting immune response to those allergens (sneezing, itchy watery eyes, runny nose, nasal congestion, etc).    - Allergy  shots improve symptoms in 75-85% of patients.  - We can discuss more at the next appointment if the medications are not working for you.  3. Return in about 6 months (around 08/07/2024). You can have the follow up appointment  with Dr. Iva or a Nurse Practicioner (our Nurse Practitioners are excellent and always have Physician oversight!).    Please inform us  of any Emergency Department visits, hospitalizations, or changes in symptoms. Call us  before going to the ED for breathing or allergy  symptoms since we might be able to fit you in for a sick visit. Feel free to contact us  anytime with any questions, problems, or concerns.  It was a pleasure to meet you today!  Websites that have reliable patient information: 1. American Academy of Asthma, Allergy , and Immunology: www.aaaai.org 2. Food Allergy  Research and Education (FARE): foodallergy.org 3. Mothers of Asthmatics: http://www.asthmacommunitynetwork.org 4. [employer] of Allergy , Asthma, and Immunology: www.acaai.org      "Like" us  on Facebook and Instagram for our latest updates!      A healthy democracy works best when [employer] participate! Make sure you are registered to vote! If you have moved or changed any of your contact information, you will need to get this updated before voting! Scan the QR codes below to learn more!         Airborne Adult Perc - 02/05/24 1421     Time Antigen Placed 1421    Allergen Manufacturer Jestine    Location Back    Number of Test 55    1. Control-Buffer 50% Glycerol Negative    2. Control-Histamine 2+    3. Bahia Negative    4. [place] Negative    5. Johnson Negative    6. [place]  Blue Negative    7. Meadow Fescue Negative    8. Perennial Rye  Negative    9. Timothy Negative    10. Ragweed Mix Negative    11. Cocklebur Negative    12. Plantain,  English Negative    13. Baccharis Negative    14. Dog Fennel Negative    15. Russian Thistle Negative    16. Lamb's Quarters Negative    17. Sheep Sorrell Negative    18. Rough Pigweed Negative    19. Marsh Elder, Rough Negative    20. Mugwort, Common Negative    21. Box, Elder Negative    22. Cedar, red Negative    23. Sweet Gum Negative     24. Pecan Pollen Negative    25. Pine Mix Negative    26. Walnut, Black Pollen Negative    27. Red Mulberry Negative    28. Ash Mix Negative    29. Birch Mix Negative    30. Beech American Negative    31. Cottonwood, [place] Negative    32. Hickory, White Negative    33. Maple Mix Negative    34. Oak, [place] Mix Negative    35. Sycamore Eastern Negative    36. Alternaria Alternata Negative    37. Cladosporium Herbarum Negative    38. Aspergillus Mix Negative    39. Penicillium Mix Negative    40. Bipolaris Sorokiniana (Helminthosporium) Negative    41. Drechslera Spicifera (Curvularia) Negative    42. Mucor Plumbeus Negative    43. Fusarium Moniliforme Negative    44. Aureobasidium Pullulans (pullulara) Negative    45. Rhizopus Oryzae Negative    46. Botrytis Cinera Negative    47. Epicoccum Nigrum Negative    48. Phoma Betae Negative    49. Dust Mite Mix Negative    50. Cat Hair 10,000 BAU/ml Negative    51.  Dog Epithelia Negative    52. Mixed Feathers Negative    53. Horse Epithelia Negative    54. Cockroach, German Negative    55. Tobacco Leaf Negative          Intradermal - 02/05/24 1452     Time Antigen Placed 1500    Allergen Manufacturer Greer    Location Arm    Number of Test 16    Control Negative    Bahia Negative    [place] Negative    Johnson Negative    7 Grass Negative    Ragweed Mix Negative    Weed Mix Negative    Tree Mix Negative    Mold 1 3+    Mold 2 3+    Mold 3 4+    Mold 4 4+    Mite Mix Negative    Cat Negative    Dog Negative    Cockroach 3+          Control of Mold Allergen   Mold and fungi can grow on a variety of surfaces provided certain temperature and moisture conditions exist.  Outdoor molds grow on plants, decaying vegetation and soil.  The major outdoor mold, Alternaria and Cladosporium, are found in very high numbers during hot and dry conditions.  Generally, a late Summer - Fall peak is seen for common outdoor  fungal spores.  Rain will temporarily lower outdoor mold spore count, but counts rise rapidly when the rainy period ends.  The most important indoor molds are Aspergillus and Penicillium.  Dark, humid and poorly ventilated basements are ideal sites for mold growth.  The next most common sites of mold growth are the bathroom and the kitchen.  Outdoor (Seasonal) Mold Control  Positive outdoor molds via skin testing: Alternaria, Cladosporium, Bipolaris (Helminthsporium), Drechslera (Curvalaria), and Mucor  Use air conditioning and keep windows closed Avoid exposure to decaying vegetation. Avoid leaf raking. Avoid grain handling. Consider wearing a face mask if working in moldy areas.    Indoor (Perennial) Mold Control   Positive indoor molds via skin testing: Aspergillus, Penicillium, Fusarium, Aureobasidium (Pullulara), and Rhizopus  Maintain humidity below 50%. Clean washable surfaces with 5% bleach solution. Remove sources e.g. contaminated carpets.    Control of Cockroach Allergen  Cockroach allergen has been identified as an important cause of acute attacks of asthma, especially in urban settings.  There are fifty-five species of cockroach that exist in the [place] , however only three, the [place], [place] and [place] species produce allergen that can affect patients with Asthma.  Allergens can be obtained from fecal particles, egg casings and secretions from cockroaches.    Remove food sources. Reduce access to water. Seal access and entry points. Spray runways with 0.5-1% Diazinon or Chlorpyrifos Blow boric acid power under stoves and refrigerator. Place bait stations (hydramethylnon) at feeding sites.  Allergy  Shots  Allergies are the result of a chain reaction that starts in the immune system. Your immune system controls how your body defends itself. For instance, if you have an allergy  to pollen, your immune system identifies pollen as an invader or allergen. Your  immune system overreacts by producing antibodies called Immunoglobulin E (IgE). These antibodies travel to cells that release chemicals, causing an allergic reaction.  The concept behind allergy  immunotherapy, whether it is received in the form of shots or tablets, is that the immune system can be desensitized to specific allergens that trigger allergy  symptoms. Although it requires time and patience, the payback can be long-term relief. Allergy  injections contain a dilute solution of those substances that you are allergic to based upon your skin testing and allergy  history.   How Do Allergy  Shots Work?  Allergy  shots work much like a vaccine. Your body responds to injected amounts of a particular allergen given in increasing doses, eventually developing a resistance and tolerance to it. Allergy  shots can lead to decreased, minimal or no allergy  symptoms.  There generally are two phases: build-up and maintenance. Build-up often ranges from three to six months and involves receiving injections with increasing amounts of the allergens. The shots are typically given once or twice a week, though more rapid build-up schedules are sometimes used.  The maintenance phase begins when the most effective dose is reached. This dose is different for each person, depending on how allergic you are and your response to the build-up injections. Once the maintenance dose is reached, there are longer periods between injections, typically two to four weeks.  Occasionally doctors give cortisone-type shots that can temporarily reduce allergy  symptoms. These types of shots are different and should not be confused with allergy  immunotherapy shots.  Who Can Be Treated with Allergy  Shots?  Allergy  shots may be a good treatment approach for people with allergic rhinitis (hay fever), allergic asthma, conjunctivitis (eye allergy ) or stinging insect allergy .   Before deciding to begin allergy  shots, you should consider:   The  length of allergy  season and the severity of your symptoms  Whether medications and/or changes to your environment can control your symptoms  Your desire to avoid long-term medication use  Time: allergy  immunotherapy requires a major time commitment  Cost: may vary depending on your insurance coverage  Allergy  shots for children  age 41 and older are effective and often well tolerated. They might prevent the onset of new allergen sensitivities or the progression to asthma.  Allergy  shots are not started on patients who are pregnant but can be continued on patients who become pregnant while receiving them. In some patients with other medical conditions or who take certain common medications, allergy  shots may be of risk. It is important to mention other medications you talk to your allergist.   What are the two types of build-ups offered:   RUSH or Rapid Desensitization -- one day of injections lasting from 8:30-4:30pm, injections every 1 hour.  Approximately half of the build-up process is completed in that one day.  The following week, normal build-up is resumed, and this entails ~16 visits either weekly or twice weekly, until reaching your "maintenance dose" which is continued weekly until eventually getting spaced out to every month for a duration of 3 to 5 years. The regular build-up appointments are nurse visits where the injections are administered, followed by required monitoring for 30 minutes.    Traditional build-up -- weekly visits for 6 -12 months until reaching "maintenance dose", then continue weekly until eventually spacing out to every 4 weeks as above. At these appointments, the injections are administered, followed by required monitoring for 30 minutes.     Either way is acceptable, and both are equally effective. With the rush protocol, the advantage is that less time is spent here for injections overall AND you would also reach maintenance dosing faster (which is when the  clinical benefit starts to become more apparent). Not everyone is a candidate for rapid desensitization.   IF we proceed with the RUSH protocol, there are premedications which must be taken the day before and the day after the rush only (this includes antihistamines, steroids, and Singulair ).  After the rush day, no prednisone  or Singulair  is required, and we just recommend antihistamines taken on your injection day.  What Is An Estimate of the Costs?  If you are interested in starting allergy  injections, please check with your insurance company about your coverage for both allergy  vial sets and allergy  injections.  Please do so prior to making the appointment to start injections.  The following are CPT codes to give to your insurance company. These are the amounts we BILL to the insurance company, but the amount YOU WILL PAY and WE RECEIVE IS SUBSTANTIALLY LESS and depends on the contracts we have with different insurance companies.   Amount Billed to Insurance One allergy  vial set  CPT 95165   $ 1200     Two allergy  vial set  CPT 95165   $ 2400     Three allergy  vial set  CPT 95165   $ 3600     One injection   CPT 95115   $ 35  Two injections   CPT 95117   $ 40 RUSH (Rapid Desensitization) CPT 95180 x 8 hours $500/hour  Regarding the allergy  injections, your co-pay may or may not apply with each injection, so please confirm this with your insurance company. When you start allergy  injections, 1 or 2 sets of vials are made based on your allergies.  Not all patients can be on one set of vials. A set of vials lasts 6 months to a year depending on how quickly you can proceed with your build-up of your allergy  injections. Vials are personalized for each patient depending on their specific allergens.  How often are allergy  injection given during  the build-up period?   Injections are given at least weekly during the build-up period until your maintenance dose is achieved. Per the doctor's discretion,  you may have the option of getting allergy  injections two times per week during the build-up period. However, there must be at least 48 hours between injections. The build-up period is usually completed within 6-12 months depending on your ability to schedule injections and for adjustments for reactions. When maintenance dose is reached, your injection schedule is gradually changed to every two weeks and later to every three weeks. Injections will then continue every 4 weeks. Usually, injections are continued for a total of 3-5 years.   When Will I Feel Better?  Some may experience decreased allergy  symptoms during the build-up phase. For others, it may take as long as 12 months on the maintenance dose. If there is no improvement after a year of maintenance, your allergist will discuss other treatment options with you.  If you aren't responding to allergy  shots, it may be because there is not enough dose of the allergen in your vaccine or there are missing allergens that were not identified during your allergy  testing. Other reasons could be that there are high levels of the allergen in your environment or major exposure to non-allergic triggers like tobacco smoke.  What Is the Length of Treatment?  Once the maintenance dose is reached, allergy  shots are generally continued for three to five years. The decision to stop should be discussed with your allergist at that time. Some people may experience a permanent reduction of allergy  symptoms. Others may relapse and a longer course of allergy  shots can be considered.  What Are the Possible Reactions?  The two types of adverse reactions that can occur with allergy  shots are local and systemic. Common local reactions include very mild redness and swelling at the injection site, which can happen immediately or several hours after. Report a delayed reaction from your last injection. These include arm swelling or runny nose, watery eyes or cough that occurs  within 12-24 hours after injection. A systemic reaction, which is less common, affects the entire body or a particular body system. They are usually mild and typically respond quickly to medications. Signs include increased allergy  symptoms such as sneezing, a stuffy nose or hives.   Rarely, a serious systemic reaction called anaphylaxis can develop. Symptoms include swelling in the throat, wheezing, a feeling of tightness in the chest, nausea or dizziness. Most serious systemic reactions develop within 30 minutes of allergy  shots. This is why it is strongly recommended you wait in your doctor's office for 30 minutes after your injections. Your allergist is trained to watch for reactions, and his or her staff is trained and equipped with the proper medications to identify and treat them.   Report to the nurse immediately if you experience any of the following symptoms: swelling, itching or redness of the skin, hives, watery eyes/nose, breathing difficulty, excessive sneezing, coughing, stomach pain, diarrhea, or light headedness. These symptoms may occur within 15-20 minutes after injection and may require medication.   Who Should Administer Allergy  Shots?  The preferred location for receiving shots is your prescribing allergist's office. Injections can sometimes be given at another facility where the physician and staff are trained to recognize and treat reactions, and have received instructions by your prescribing allergist.  What if I am late for an injection?   Injection dose will be adjusted depending upon how many days or weeks you are late for  your injection.   What if I am sick?   Please report any illness to the nurse before receiving injections. She may adjust your dose or postpone injections depending on your symptoms. If you have fever, flu, sinus infection or chest congestion it is best to postpone allergy  injections until you are better. Never get an allergy  injection if your asthma is  causing you problems. If your symptoms persist, seek out medical care to get your health problem under control.  What If I am or Become Pregnant:  Women that become pregnant should schedule an appointment with [facility] before receiving any further allergy  injections.

## 2024-02-05 NOTE — Progress Notes (Unsigned)
 FOLLOW UP  Date of Service/Encounter:  02/06/24   Assessment:   Shortness of breath    Perennial and seasonal allergic rhinitis (indoor molds, outdoor molds, and cockroach)   Unfortunately, testing today was not extremely reactive to any environmental allergen.  His molds were reactive as well as cockroach, but I am unsure how relevant this is to his clinical presentation.  He certainly could pursue allergen immunotherapy, which would certainly help with the postnasal drip and mucus production.  However, would not start a different nose spray instead and try stronger H1 blockade first.  Plan/Recommendations:   1. Moderate persistent asthma, uncomplicated - Lung testing not done today. - We are not going to make any changes at all for your breathing.  - Daily controller medication(s): Symbicort  160/4.82mcg two puffs twice daily with spacer - Prior to physical activity: albuterol  2 puffs 10-15 minutes before physical activity. - Rescue medications: albuterol  4 puffs every 4-6 hours as needed - Asthma control goals:  * Full participation in all desired activities (may need albuterol  before activity) * Albuterol  use two time or less a week on average (not counting use with activity) * Cough interfering with sleep two time or less a month * Oral steroids no more than once a year * No hospitalizations  2. Chronic rhinitis - Testing today showed: indoor molds, outdoor molds, and cockroach - Copy of test results provided.  - Avoidance measures provided. - Stop taking: Flonase   - Continue taking: Astelin  - Start taking: carbinoxamine 4mg  tablet 1-2 times daily as needed and Xhance  (fluticasone ) 1-2 sprays per nostril daily  - Xhance  sample provided today to see if this helps (to decrease your mucous production).  - You can use an extra dose of the antihistamine, if needed, for breakthrough symptoms.  - Consider nasal saline rinses 1-2 times daily to remove allergens from the nasal  cavities as well as help with mucous clearance (this is especially helpful to do before the nasal sprays are given) - Consider allergy  shots as a means of long-term control. - Allergy  shots re-train and reset the immune system to ignore environmental allergens and decrease the resulting immune response to those allergens (sneezing, itchy watery eyes, runny nose, nasal congestion, etc).    - Allergy  shots improve symptoms in 75-85% of patients.  - We can discuss more at the next appointment if the medications are not working for you.  3. Return in about 6 months (around 08/07/2024). You can have the follow up appointment with Dr. Iva or a Nurse Practicioner (our Nurse Practitioners are excellent and always have Physician oversight!).   Subjective:   Bruce Fisher is a 63 y.o. male presenting today for follow up of  Chief Complaint  Patient presents with   Allergy  Testing    1-55    Bruce Fisher has a history of the following: Patient Active Problem List   Diagnosis Date Noted   Asthmatic bronchitis , chronic (HCC) 11/30/2023   SVT (supraventricular tachycardia) (HCC) 11/22/2023   COPD exacerbation (HCC) 10/23/2023   Pansinusitis 09/23/2023   acute on chronic sinusitis 09/23/2023   Acute respiratory failure with hypoxia (HCC) 09/22/2023   History of postnasal drip 09/22/2023   Prolonged QT interval 09/22/2023   Type 2 diabetes mellitus with hyperglycemia (HCC) 09/22/2023   Acute exacerbation of chronic obstructive pulmonary disease (COPD) (HCC) 09/21/2023   Perennial allergic rhinitis 06/22/2022   Type 2 diabetes mellitus without complication, without long-term current use of insulin  (HCC) 06/07/2021  Irregular heart rhythm 06/07/2021   PVC's (premature ventricular contractions) 05/28/2019   Dyslipidemia 05/28/2019   Multiple kidney stones 02/21/2019   Ventricular bigeminy 10/24/2018   Grade I diastolic dysfunction 10/24/2018   GERD (gastroesophageal reflux disease)  05/19/2015   Erectile dysfunction 03/23/2015   Mixed hyperlipidemia    Essential hypertension     History obtained from: chart review and patient.  Discussed the use of AI scribe software for clinical note transcription with the patient and/or guardian, who gave verbal consent to proceed.  Bruce Fisher is a 63 y.o. male presenting for skin testing. He was last seen on July 2nd. We could not do testing because his insurance company does not cover testing on the same day as a New Patient visit. He has been off of all antihistamines 3 days in anticipation of the testing.   At that time, lung testing was in the 6% range and did not improve with the albuterol  treatment.  We continue with Symbicort  2 puffs twice daily for now since he was on that from Dr. Darlean.  For his rhinitis, he was reporting copious drainage which necessitated the need for allergy  testing.  Otherwise, there have been no changes to his past medical history, surgical history, family history, or social history.    Review of systems otherwise negative other than that mentioned in the HPI.    Objective:   There were no vitals taken for this visit. There is no height or weight on file to calculate BMI.    Physical exam deferred since this was a skin testing appointment only.   Diagnostic studies:    Allergy  Studies:     Airborne Adult Perc - 02/05/24 1421     Time Antigen Placed 1421    Allergen Manufacturer Jestine    Location Back    Number of Test 55    1. Control-Buffer 50% Glycerol Negative    2. Control-Histamine 2+    3. Bahia Negative    4. French Southern Territories Negative    5. Johnson Negative    6. Kentucky  Blue Negative    7. Meadow Fescue Negative    8. Perennial Rye Negative    9. Timothy Negative    10. Ragweed Mix Negative    11. Cocklebur Negative    12. Plantain,  English Negative    13. Baccharis Negative    14. Dog Fennel Negative    15. Russian Thistle Negative    16. Lamb's Quarters Negative    17.  Sheep Sorrell Negative    18. Rough Pigweed Negative    19. Marsh Elder, Rough Negative    20. Mugwort, Common Negative    21. Box, Elder Negative    22. Cedar, red Negative    23. Sweet Gum Negative    24. Pecan Pollen Negative    25. Pine Mix Negative    26. Walnut, Black Pollen Negative    27. Red Mulberry Negative    28. Ash Mix Negative    29. Birch Mix Negative    30. Beech American Negative    31. Cottonwood, Guinea-Bissau Negative    32. Hickory, White Negative    33. Maple Mix Negative    34. Oak, Guinea-Bissau Mix Negative    35. Sycamore Eastern Negative    36. Alternaria Alternata Negative    37. Cladosporium Herbarum Negative    38. Aspergillus Mix Negative    39. Penicillium Mix Negative    40. Bipolaris Sorokiniana (Helminthosporium) Negative    41.  Drechslera Spicifera (Curvularia) Negative    42. Mucor Plumbeus Negative    43. Fusarium Moniliforme Negative    44. Aureobasidium Pullulans (pullulara) Negative    45. Rhizopus Oryzae Negative    46. Botrytis Cinera Negative    47. Epicoccum Nigrum Negative    48. Phoma Betae Negative    49. Dust Mite Mix Negative    50. Cat Hair 10,000 BAU/ml Negative    51.  Dog Epithelia Negative    52. Mixed Feathers Negative    53. Horse Epithelia Negative    54. Cockroach, German Negative    55. Tobacco Leaf Negative          Intradermal - 02/05/24 1452     Time Antigen Placed 1500    Allergen Manufacturer Greer    Location Arm    Number of Test 16    Control Negative    Bahia Negative    French Southern Territories Negative    Johnson Negative    7 Grass Negative    Ragweed Mix Negative    Weed Mix Negative    Tree Mix Negative    Mold 1 3+    Mold 2 3+    Mold 3 4+    Mold 4 4+    Mite Mix Negative    Cat Negative    Dog Negative    Cockroach 3+          Allergy  testing results were read and interpreted by myself, documented by clinical staff.      Marty Shaggy, MD  [facility]

## 2024-02-06 ENCOUNTER — Encounter: Payer: Self-pay | Admitting: Allergy & Immunology

## 2024-02-10 ENCOUNTER — Other Ambulatory Visit: Payer: Self-pay | Admitting: Family Medicine

## 2024-02-10 DIAGNOSIS — J329 Chronic sinusitis, unspecified: Secondary | ICD-10-CM

## 2024-02-28 ENCOUNTER — Other Ambulatory Visit: Payer: Self-pay | Admitting: Family Medicine

## 2024-03-11 ENCOUNTER — Encounter: Payer: Self-pay | Admitting: Family Medicine

## 2024-03-11 ENCOUNTER — Ambulatory Visit: Payer: Self-pay | Admitting: Family Medicine

## 2024-03-11 ENCOUNTER — Ambulatory Visit: Admitting: Family Medicine

## 2024-03-11 VITALS — BP 178/94 | HR 63 | Temp 97.9°F | Ht 72.0 in | Wt 251.4 lb

## 2024-03-11 DIAGNOSIS — E119 Type 2 diabetes mellitus without complications: Secondary | ICD-10-CM

## 2024-03-11 DIAGNOSIS — E782 Mixed hyperlipidemia: Secondary | ICD-10-CM | POA: Diagnosis not present

## 2024-03-11 DIAGNOSIS — R053 Chronic cough: Secondary | ICD-10-CM

## 2024-03-11 DIAGNOSIS — I10 Essential (primary) hypertension: Secondary | ICD-10-CM

## 2024-03-11 DIAGNOSIS — Z7984 Long term (current) use of oral hypoglycemic drugs: Secondary | ICD-10-CM

## 2024-03-11 LAB — CBC WITH DIFFERENTIAL/PLATELET
Basophils Absolute: 0.1 x10E3/uL (ref 0.0–0.2)
Basos: 1 %
EOS (ABSOLUTE): 0.8 x10E3/uL — ABNORMAL HIGH (ref 0.0–0.4)
Eos: 10 %
Hematocrit: 46.4 % (ref 37.5–51.0)
Hemoglobin: 15 g/dL (ref 13.0–17.7)
Immature Grans (Abs): 0 x10E3/uL (ref 0.0–0.1)
Immature Granulocytes: 0 %
Lymphocytes Absolute: 2.2 x10E3/uL (ref 0.7–3.1)
Lymphs: 28 %
MCH: 29 pg (ref 26.6–33.0)
MCHC: 32.3 g/dL (ref 31.5–35.7)
MCV: 90 fL (ref 79–97)
Monocytes Absolute: 0.7 x10E3/uL (ref 0.1–0.9)
Monocytes: 9 %
Neutrophils Absolute: 4.2 x10E3/uL (ref 1.4–7.0)
Neutrophils: 52 %
Platelets: 171 x10E3/uL (ref 150–450)
RBC: 5.17 x10E6/uL (ref 4.14–5.80)
RDW: 14 % (ref 11.6–15.4)
WBC: 8 x10E3/uL (ref 3.4–10.8)

## 2024-03-11 LAB — CMP14+EGFR
ALT: 19 IU/L (ref 0–44)
AST: 14 IU/L (ref 0–40)
Albumin: 4.2 g/dL (ref 3.8–4.9)
Alkaline Phosphatase: 59 IU/L (ref 44–121)
BUN/Creatinine Ratio: 13 (ref 10–24)
BUN: 13 mg/dL (ref 8–27)
Bilirubin Total: 0.4 mg/dL (ref 0.0–1.2)
CO2: 22 mmol/L (ref 20–29)
Calcium: 8.9 mg/dL (ref 8.6–10.2)
Chloride: 105 mmol/L (ref 96–106)
Creatinine, Ser: 1.01 mg/dL (ref 0.76–1.27)
Globulin, Total: 2.7 g/dL (ref 1.5–4.5)
Glucose: 105 mg/dL — ABNORMAL HIGH (ref 70–99)
Potassium: 4.1 mmol/L (ref 3.5–5.2)
Sodium: 142 mmol/L (ref 134–144)
Total Protein: 6.9 g/dL (ref 6.0–8.5)
eGFR: 85 mL/min/1.73 (ref >59)

## 2024-03-11 LAB — LIPID PANEL
Chol/HDL Ratio: 3.2 ratio (ref 0.0–5.0)
Cholesterol, Total: 127 mg/dL (ref 100–199)
HDL: 40 mg/dL (ref >39)
LDL Chol Calc (NIH): 65 mg/dL (ref 0–99)
Triglycerides: 124 mg/dL (ref 0–149)
VLDL Cholesterol Cal: 22 mg/dL (ref 5–40)

## 2024-03-11 LAB — BAYER DCA HB A1C WAIVED: HB A1C (BAYER DCA - WAIVED): 5.9 % — ABNORMAL HIGH (ref 4.8–5.6)

## 2024-03-11 MED ORDER — IRBESARTAN 150 MG PO TABS: 150.0000 mg | ORAL_TABLET | Freq: Every day | ORAL | 3 refills | Status: DC

## 2024-03-11 MED ORDER — AZELASTINE HCL 0.1 % NA SOLN: 2.0000 | Freq: Two times a day (BID) | NASAL | 12 refills | Status: AC

## 2024-03-11 MED ORDER — OLMESARTAN MEDOXOMIL 40 MG PO TABS: 40.0000 mg | ORAL_TABLET | Freq: Every day | ORAL | 1 refills | Status: AC

## 2024-03-11 MED ORDER — DILTIAZEM HCL ER COATED BEADS 120 MG PO CP24: 120.0000 mg | ORAL_CAPSULE | Freq: Every day | ORAL | 3 refills | Status: AC

## 2024-03-11 MED ORDER — ROSUVASTATIN CALCIUM 20 MG PO TABS: 20.0000 mg | ORAL_TABLET | Freq: Every day | ORAL | 3 refills | Status: AC

## 2024-03-11 MED ORDER — METFORMIN HCL 500 MG PO TABS: 500.0000 mg | ORAL_TABLET | Freq: Every day | ORAL | 3 refills | Status: DC

## 2024-03-11 NOTE — Patient Instructions (Signed)
 Discontinue irbesartan 

## 2024-03-11 NOTE — Progress Notes (Signed)
 Subjective:  Patient ID: Bruce Fisher, male    DOB: Oct 30, 1960  Age: 63 y.o. MRN: [id number]  CC: 3 MONTH FOLLOW UP   HPI  Discussed the use of AI scribe software for clinical note transcription with the patient, who gave verbal consent to proceed.  History of Present Illness   Bruce Fisher is a 63 year old male who presents for a routine checkup.  He experiences ongoing nasal congestion and lung inflammation. Despite testing by an allergy  specialist revealing no allergies, he continues to have nasal congestion, describing it as a 'nasal thing' that is 'stopped up'. He has difficulty breathing through his nose but denies shortness of breath.  He uses Symbicort , two puffs in the morning and two in the evening, to manage lung inflammation. Discontinuation leads to symptom exacerbation, including a gurgling sound when lying down and coughing. He believes Symbicort  helps control inflammation but may contribute to his cough. He uses albuterol  as a rescue inhaler but not regularly. He has a history of being told he might have COPD, but a lung specialist told him he does not have COPD. He seeks further evaluation of his lung tissue to identify the cause of his symptoms.  He mentions his blood sugar levels, noting infrequent checks due to disappointment with results, with levels around 160-170 mg/dL when checked midday. His A1c is 5.9%.  He is on irbesartan  for blood pressure management but reports high readings at various appointments despite adherence to medication.       in for follow-up of elevated cholesterol. Doing well without complaints on current medication. Denies side effects of statin including myalgia and arthralgia and nausea. Currently no chest pain, shortness of breath or other cardiovascular related symptoms noted.        03/11/2024    8:57 AM 01/29/2024    2:34 PM 09/20/2023    8:34 AM  Depression screen PHQ 2/9  Decreased Interest 0 0 0  Down, Depressed,  Hopeless 0 0 0  PHQ - 2 Score 0 0 0    History Bruce Fisher has a past medical history of COPD (chronic obstructive pulmonary disease) (HCC), Diabetes mellitus without complication (HCC), Hyperlipidemia, Hypertension, Kidney stones, Rectal spasm, and Renal disorder.   He has a past surgical history that includes Spine surgery.   His family history includes Cancer in his father and sister; Diabetes in his mother; Heart disease in his sister; Heart failure in his mother.He reports that he quit smoking about 30 years ago. His smoking use included cigarettes. He has never used smokeless tobacco. He reports that he does not drink alcohol and does not use drugs.    ROS Review of Systems  Constitutional:  Negative for fever.  HENT:  Positive for congestion.   Respiratory:  Negative for shortness of breath.   Cardiovascular:  Negative for chest pain.  Musculoskeletal:  Negative for arthralgias.  Skin:  Negative for rash.    Objective:  BP (!) 178/94   Pulse 63   Temp 97.9 F (36.6 C)   Ht 6' (1.829 m)   Wt 251 lb 6.4 oz (114 kg)   SpO2 95%   BMI 34.10 kg/m   BP Readings from Last 3 Encounters:  03/11/24 (!) 178/94  01/29/24 (!) 142/86  01/24/24 (!) 154/90    Wt Readings from Last 3 Encounters:  03/11/24 251 lb 6.4 oz (114 kg)  01/29/24 247 lb (112 kg)  01/24/24 248 lb 2 oz (112.5 kg)     Physical Exam  Vitals reviewed.  Constitutional:      Appearance: He is well-developed.  HENT:     Head: Normocephalic and atraumatic.     Right Ear: External ear normal.     Left Ear: External ear normal.     Mouth/Throat:     Pharynx: No oropharyngeal exudate or posterior oropharyngeal erythema.  Eyes:     Pupils: Pupils are equal, round, and reactive to light.  Cardiovascular:     Rate and Rhythm: Normal rate and regular rhythm.     Heart sounds: No murmur heard. Pulmonary:     Effort: No respiratory distress.     Breath sounds: Normal breath sounds.  Musculoskeletal:     Cervical  back: Normal range of motion and neck supple.  Neurological:     Mental Status: He is alert and oriented to person, place, and time.      Assessment & Plan:  Type 2 diabetes mellitus without complication, without long-term current use of insulin  (HCC) -     Bayer DCA Hb A1c Waived -     CMP14+EGFR -     CBC with Differential/Platelet -     Microalbumin / creatinine urine ratio  Mixed hyperlipidemia -     Rosuvastatin  Calcium ; Take 1 tablet (20 mg total) by mouth daily. for cholesterol.  Dispense: 90 tablet; Refill: 3 -     Lipid panel  Essential hypertension -     CMP14+EGFR -     CBC with Differential/Platelet  Chronic cough -     Pulmonary Visit  Other orders -     dilTIAZem  HCl ER Coated Beads; Take 1 capsule (120 mg total) by mouth daily.  Dispense: 90 capsule; Refill: 3 -     metFORMIN  HCl; Take 1 tablet (500 mg total) by mouth daily with breakfast.  Dispense: 90 tablet; Refill: 3 -     Olmesartan  Medoxomil; Take 1 tablet (40 mg total) by mouth daily. For blood pressure  Dispense: 90 tablet; Refill: 1 -     Azelastine  HCl; Place 2 sprays into both nostrils 2 (two) times daily. Use in each nostril as directed  Dispense: 30 mL; Refill: 12    Assessment and Plan    Chronic cough and lung inflammation Managed with Symbicort . Cough partially due to medication and residual inflammation. Previous evaluation ruled out COPD. Lung specialist evaluation considered reasonable. - Refer to lung specialist at [facility], preferably Dr. Marnie or Dr. Linnie. - Discuss potential lung tissue evaluation with specialist.  Chronic nasal congestion Persistent congestion with negative allergy  testing. Causes difficulty breathing through the nose. - Prescribe Astepro  nasal spray. - Consider ENT referral if symptoms persist after lung specialist evaluation.  Hypertension Blood pressure elevated at 164/94 mmHg. Current medication, irbesartan , ineffective. Decision to switch to  olmesartan . - Discontinue irbesartan . - Prescribe olmesartan . - Send prescription to [facility] pharmacy.  Type 2 diabetes mellitus A1c well-controlled at 5.9%. Not regularly checking blood sugar. Educated on targets and encouraged consistent monitoring. - Instruct to check blood sugar first thing in the morning and two hours after meals. - Educate on blood sugar targets: under 125 mg/dL fasting and under 819 mg/dL postprandial.  Hyperlipidemia Managed with rosuvastatin , well-tolerated.  Eye examination Desires eye examination to update prescription. Insurance covers every two years. - Recommend eye examination at local optometrist near [place].          Follow-up: Return in about 3 months (around 06/11/2024).  Butler Der, M.D.

## 2024-03-12 LAB — MICROALBUMIN / CREATININE URINE RATIO
Creatinine, Urine: 252.9 mg/dL
Microalb/Creat Ratio: 10 mg/g{creat} (ref 0–29)
Microalbumin, Urine: 24.9 ug/mL

## 2024-03-13 NOTE — Progress Notes (Signed)
Hello [name],  Your lab result is normal and/or stable.Some minor variations that are not significant are commonly marked abnormal, but do not represent any medical problem for you.  Best regards, Mechele Claude, M.D.

## 2024-03-17 DIAGNOSIS — R002 Palpitations: Secondary | ICD-10-CM | POA: Insufficient documentation

## 2024-03-17 NOTE — Progress Notes (Unsigned)
 Cardiology Office Note   Date:  03/19/2024   ID:  Bruce Fisher, Bruce Fisher 1961/07/10, MRN [id number]  PCP:  Bruce Lowers, MD  Cardiologist:   None Referring:  Bruce Lowers, MD  No chief complaint on file.     History of Present Illness: Bruce Fisher is a 63 y.o. male who I saw in 2020 for evaluation of PVCs. He had a low risk POET (Plain Old Exercise Treadmill).  EF demonstrated a low normal EF.     He presents for follow up.  He has been in the hospital twice this year with SOB.  He was thought to have COPD flare but has been told by pulmonologist as an outpatient that he does not have COPD.  He does have asthmatic bronchitis apparently.  I do not see any cardiac workup at that time.  However, he had problems with tachypalpitations and recently had a monitor.  He was only able to wear this for 7 days because he kept coming off.  He had predominantly sinus rhythm.  He had 1 run of nonsustained VT 4 beats.  He had frequent ventricular ectopy constituting 11.7% of the beats.  There was ventricular bigeminy and trigeminy.  He says that he has been had a couple of episodes where he is monitoring himself at home and his heart rate goes in the 140s or 150s and might stay that fast for 9220 minutes.  He feels it racing in his chest.  He might get fatigued but he does not have chest pressure, neck or arm discomfort.  He does not describe presyncope or syncope.  He has had no PND or orthopnea.  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  Of note he was on amlodipine  and this was discontinued and he was started on diltiazem  recently and did seem to have improvement in his symptoms.   Past Medical History:  Diagnosis Date   Diabetes mellitus without complication (HCC)    Hyperlipidemia    Hypertension    Kidney stones    Rectal spasm     Past Surgical History:   Procedure Laterality Date   SPINE SURGERY     lower back     Current Outpatient Medications  Medication Sig Dispense Refill   albuterol  (VENTOLIN  HFA) 108 (90 Base) MCG/ACT inhaler Inhale 2 puffs into the lungs every 4 (four) hours as needed for wheezing or shortness of breath. 8 g 3   aspirin EC 81 MG tablet Take 81 mg by mouth daily. Swallow whole.     azelastine  (ASTELIN ) 0.1 % nasal spray Place 2 sprays into both nostrils 2 (two) times daily. Use in each nostril as directed 30 mL 12   budesonide -formoterol  (SYMBICORT ) 160-4.5 MCG/ACT inhaler Inhale 2 puffs into the lungs 2 times daily at 12 noon and 4 pm. 1 each 12   Cholecalciferol  (VITAMIN D  PO) Take 1 tablet by mouth daily.     Coenzyme Q10 (CO Q-10) 100 MG CAPS Take 100 mg by mouth daily.     diltiazem  (CARDIZEM  CD) 120 MG 24 hr capsule Take 1 capsule (120 mg total) by mouth daily. 90 capsule 3   loratadine  (CLARITIN ) 10 MG tablet Take 1 tablet by mouth once daily 90 tablet 1   metFORMIN  (GLUCOPHAGE ) 500 MG tablet Take 1 tablet (500 mg total) by mouth daily with  breakfast. 90 tablet 3   Multiple Vitamin (MULTIVITAMIN WITH MINERALS) TABS tablet Take 1 tablet by mouth daily.     olmesartan  (BENICAR ) 40 MG tablet Take 1 tablet (40 mg total) by mouth daily. For blood pressure 90 tablet 1   pantoprazole  (PROTONIX ) 40 MG tablet Take 1 tablet (40 mg total) by mouth daily. 30 tablet 1   rosuvastatin  (CRESTOR ) 20 MG tablet Take 1 tablet (20 mg total) by mouth daily. for cholesterol. 90 tablet 3   triamcinolone  (NASACORT ) 55 MCG/ACT AERO nasal inhaler Place 2 sprays into the nose at bedtime. 16.9 g 11   Current Facility-Administered Medications  Medication Dose Route Frequency Provider Last Rate Last Admin   betamethasone  acetate-betamethasone  sodium phosphate (CELESTONE ) injection 6 mg  6 mg Intramuscular Once         Allergies:   Patient has no known allergies.    Social History:  The patient  reports that he quit smoking about 30  years ago. His smoking use included cigarettes. He has never used smokeless tobacco. He reports that he does not drink alcohol and does not use drugs.   Family History:  The patient's family history includes Cancer in his father and sister; Diabetes in his mother; Heart disease in his sister; Heart failure in his mother.    ROS:  Please see the history of present illness.   Otherwise, review of systems are positive for none.   All other systems are reviewed and negative.    PHYSICAL EXAM: VS:  BP 122/74   Pulse 77   Ht 6' 2 (1.88 m)   Wt 249 lb 12.8 oz (113.3 kg)   SpO2 94%   BMI 32.07 kg/m  , BMI Body mass index is 32.07 kg/m. GENERAL:  Well appearing HEENT:  Pupils equal round and reactive, fundi not visualized, oral mucosa unremarkable NECK:  No jugular venous distention, waveform within normal limits, carotid upstroke brisk and symmetric, no bruits, no thyromegaly LYMPHATICS:  No cervical, inguinal adenopathy LUNGS:  Clear to auscultation bilaterally BACK:  No CVA tenderness CHEST:  Unremarkable HEART:  PMI not displaced or sustained,S1 and S2 within normal limits, no S3, no S4, no clicks, no rubs, no murmurs ABD:  Flat, positive bowel sounds normal in frequency in pitch, no bruits, no rebound, no guarding, no midline pulsatile mass, no hepatomegaly, no splenomegaly EXT:  2 plus pulses throughout, no edema, no cyanosis no clubbing SKIN:  No rashes no nodules NEURO:  Cranial nerves II through XII grossly intact, motor grossly intact throughout [facility]:  Cognitively intact, oriented to person place and time    EKG:  EKG Interpretation Date/Time:  Tuesday March 19 2024 13:57:08 EDT Ventricular Rate:  77 PR Interval:  182 QRS Duration:  86 QT Interval:  398 QTC Calculation: 450 R Axis:   52  Text Interpretation: Sinus rhythm with frequent Premature ventricular complexes Nonspecific ST abnormality When compared with ECG of 24-Oct-2023 01:18, Premature ventricular complexes  are now Present Confirmed by Lavona Agent ([id number]) on 03/19/2024 2:17:36 PM     Recent Labs: 09/23/2023: Magnesium  2.1 10/23/2023: B Natriuretic Peptide 25.0 03/11/2024: ALT 19; BUN 13; Creatinine, Ser 1.01; Hemoglobin 15.0; Platelets 171; Potassium 4.1; Sodium 142    Lipid Panel    Component Value Date/Time   CHOL 127 03/11/2024 0907   TRIG 124 03/11/2024 0907   TRIG 208 (H) 08/19/2013 1637   HDL 40 03/11/2024 0907   HDL 42 08/19/2013 1637   CHOLHDL 3.2 03/11/2024 0907   LDLCALC  65 03/11/2024 0907   LDLCALC 93 08/19/2013 1637      Wt Readings from Last 3 Encounters:  03/19/24 249 lb 12.8 oz (113.3 kg)  03/11/24 251 lb 6.4 oz (114 kg)  01/29/24 247 lb (112 kg)      Other studies Reviewed: Additional studies/ records that were reviewed today include:  Hospital records, pulmonary records Review of the above records demonstrates:  Please see elsewhere in the note.     ASSESSMENT AND PLAN:   VENTRICULAR  ECTOPY:    He seems to symptomatically be better on the diltiazem  and he will continue this.  I am going to check an echocardiogram given the extent of his ectopy.  If this is normal I will likely rule out ischemia with a perfusion study.  He does have shortness of breath when you are walk on a treadmill so he would need a pharmacologic perfusion study.  HTN: His blood pressure is controlled on the meds as listed.  No change in therapy.      Current medicines are reviewed at length with the patient today.  The patient does not have concerns regarding medicines.  The following changes have been made:  no change  Labs/ tests ordered today include:   Orders Placed This Encounter  Procedures   EKG 12-Lead   ECHOCARDIOGRAM COMPLETE     Disposition:   FU with me in a couple of months after the above testing.     Signed, Lynwood Schilling, MD  03/19/2024 3:09 PM    [facility]

## 2024-03-19 ENCOUNTER — Encounter: Payer: Self-pay | Admitting: Cardiology

## 2024-03-19 ENCOUNTER — Ambulatory Visit: Attending: Cardiology | Admitting: Cardiology

## 2024-03-19 ENCOUNTER — Encounter: Payer: Self-pay | Admitting: Pulmonary Disease

## 2024-03-19 VITALS — BP 122/74 | HR 77 | Ht 74.0 in | Wt 249.8 lb

## 2024-03-19 DIAGNOSIS — E785 Hyperlipidemia, unspecified: Secondary | ICD-10-CM | POA: Diagnosis not present

## 2024-03-19 DIAGNOSIS — I10 Essential (primary) hypertension: Secondary | ICD-10-CM | POA: Diagnosis not present

## 2024-03-19 DIAGNOSIS — R002 Palpitations: Secondary | ICD-10-CM | POA: Diagnosis not present

## 2024-03-19 DIAGNOSIS — I493 Ventricular premature depolarization: Secondary | ICD-10-CM | POA: Insufficient documentation

## 2024-03-19 DIAGNOSIS — E118 Type 2 diabetes mellitus with unspecified complications: Secondary | ICD-10-CM | POA: Insufficient documentation

## 2024-03-19 NOTE — Patient Instructions (Signed)
 Medication Instructions:  Your physician recommends that you continue on your current medications as directed. Please refer to the Current Medication list given to you today.  *If you need a refill on your cardiac medications before your next appointment, please call your pharmacy*  Lab Work: None ordered   Testing/Procedures: Your physician has requested that you have an echocardiogram. Echocardiography is a painless test that uses sound waves to create images of your heart. It provides your doctor with information about the size and shape of your heart and how well your heart's chambers and valves are working. This procedure takes approximately one hour. There are no restrictions for this procedure. Please do NOT wear cologne, perfume, aftershave, or lotions (deodorant is allowed). Please arrive 15 minutes prior to your appointment time.  Please note: We ask at that you not bring children with you during ultrasound (echo/ vascular) testing. Due to room size and safety concerns, children are not allowed in the ultrasound rooms during exams. Our front office staff cannot provide observation of children in our lobby area while testing is being conducted. An adult accompanying a patient to their appointment will only be allowed in the ultrasound room at the discretion of the ultrasound technician under special circumstances. We apologize for any inconvenience.   Follow-Up: At [facility], you and your health needs are our priority.  As part of our continuing mission to provide you with exceptional heart care, our providers are all part of one team.  This team includes your primary Cardiologist (physician) and Advanced Practice Providers or APPs (Physician Assistants and Nurse Practitioners) who all work together to provide you with the care you need, when you need it.  Your next appointment:   2-3 month(s)  in [place] office  Provider:   Lynwood Schilling, MD    We recommend signing up  for the patient portal called MyChart.  Sign up information is provided on this After Visit Summary.  MyChart is used to connect with patients for Virtual Visits (Telemedicine).  Patients are able to view lab/test results, encounter notes, upcoming appointments, etc.  Non-urgent messages can be sent to your provider as well.   To learn more about what you can do with MyChart, go to [email or web address].   Thank you for choosing [facility]!!   [phone]   Other Instructions   To send apple watch strips via mychart:  1) save the strips from your health app onto your device used for mychart.  2) once saved in your files go to mychart and sign on 3) click messages - ask question - select the provider you are wanting to send the strips too. 4) click at the bottom add attachment.  5) select file and upload then send message  It's definitely easier if you use the same device to upload the strips and access strips. If you have any questions please let me know.  Thanks! Rosalee Tolley RN       We recommend that you order a Kardia monitor for home use when the palpitations occur. You can attach your kardia monitor EKG to your MyChart account and send to us  for review       Catha is a smart device that can record a medical-grade electrocardiogram (EKG) right on your smartphone. EKGs measure the electrical activity of your heart and are used in hospitals to detect irregularities with your heart rate or rhythm, which may be indicators of a heart condition such as atrial fibrillation (AFib). KardiaMobile records a  single-lead EKG, which provides you and your doctor with reliable information on your heart health. KardiaMobile can detect AFib, Bradycardia, and Tachycardia, with more determinations available with a [employer]. Catha is clinically validated, CE marked, and FDA-cleared, making it one of the most reliable ways to check in on your heart from home. Kardia  Mobile device by [employer]  is around $90 and the phone application is free.  The web site is:  https://www.alivecor.com    Kardia Mobile - sending an EKG Download app and set up profile. Run EKG - by placing 1-2 fingers on the silver plates After EKG is complete - Download PDF - Skip password (if you apply a password the provider will need it to view the EKG) Click share button (square with upward arrow) in bottom left corner To send: choose MyChart (first time log into MyChart) Pop up window about sending ECG Click continue Choose type of message Choose provider Type subject and message Click send (EKG should be attached) - To send additional EKGs in one message click the paperclip image and bottom of page to attach.

## 2024-03-27 ENCOUNTER — Other Ambulatory Visit: Payer: Self-pay | Admitting: Family Medicine

## 2024-03-27 DIAGNOSIS — I10 Essential (primary) hypertension: Secondary | ICD-10-CM

## 2024-04-02 ENCOUNTER — Ambulatory Visit: Admitting: Internal Medicine

## 2024-04-05 ENCOUNTER — Ambulatory Visit (HOSPITAL_COMMUNITY)
Admission: RE | Admit: 2024-04-05 | Discharge: 2024-04-05 | Disposition: A | Discharge status: Home / Self Care | Source: Ambulatory Visit | Attending: Cardiology | Admitting: Cardiology

## 2024-04-05 DIAGNOSIS — I493 Ventricular premature depolarization: Secondary | ICD-10-CM | POA: Insufficient documentation

## 2024-04-05 DIAGNOSIS — I10 Essential (primary) hypertension: Secondary | ICD-10-CM | POA: Diagnosis not present

## 2024-04-05 LAB — ECHOCARDIOGRAM COMPLETE
AR max vel: 2.19 cm2
AV Peak grad: 8 mmHg
Ao pk vel: 1.41 m/s
Area-P 1/2: 1.67 cm2
Calc EF: 51.2 %
S' Lateral: 3.9 cm
Single Plane A2C EF: 46.7 %
Single Plane A4C EF: 59 %

## 2024-04-07 ENCOUNTER — Ambulatory Visit: Payer: Self-pay | Admitting: Cardiology

## 2024-04-07 DIAGNOSIS — I493 Ventricular premature depolarization: Secondary | ICD-10-CM

## 2024-04-13 NOTE — Progress Notes (Unsigned)
 Bruce Fisher, male    DOB: 1961/05/04    MRN: [id number]   Brief patient profile:  62  yobm  quit smoking 1995 s resp sequelae  referred to pulmonary clinic in [place]  11/30/2023 by [facility]  s/p admit:   Pt not previously seen by PCCM service.    Onset of bad rhinitis esp in am's around 2022-23 year round responsive to prednisone    CT sinus 09/22/23 1. Pansinusitis, appears to be acute on chronic. All paranasal sinuses affected and sinus drainage pathways opacified. But no complicating features are identified. 2. Associated Rhinitis.  Nasal septum intact   Admit date:     10/23/2023  Discharge date: 10/24/23  Discharge Physician: Eric Nunnery    PCP: [name], MD    Recommendations at discharge:  Repeat basic metabolic panel to follow electrolytes and renal function Reassess blood pressure and adjust medication as needed Continue to closely follow patient's CBGs/A1c fluctuation and further adjust hypoglycemic regimen as needed. Make sure patient follow-up with cardiology service and pulmonologist as instructed.   Discharge Diagnoses: Principal Problem:   COPD exacerbation (HCC)   Type 2 diabetes mellitus without complication, without long-term current use of insulin  (HCC)   Acute respiratory failure with hypoxia (HCC)   Essential hypertension   Dyslipidemia      History of Present Illness  11/30/2023  Pulmonary/ 1st office eval/ Bruce Fisher / [place] [facility]  Since Aprl 15 th finished pred maint on breyna  Chief Complaint  Patient presents with   COPD  Dyspnea:  yardwork ok Cough: none in am, some daytime cough white  Sleep: flat bed, 3 pillows under head /  SABA use: not using  02: none  Rec Call me with the name of your inhalers Plan A = Automatic = Always=    Breyna  160 Take 2 puffs first thing in am and then another 2 puffs about 12 hours later (pm dose is optional)  Work on inhaler technique:    Plan B = Backup (to supplement plan A, not to replace  it) Only use your albuterol  inhaler as a rescue medication  Please schedule a follow up office visit in 6 weeks, call sooner if needed with all medications /inhalers/ solutions in hand    Spirometry  01/24/24 FEV1 1.93 (60%)  Ratio 0.69  ? P bronchodilators? With mild concavity on f/v curve     04/15/2024  f/u ov/Bruce Fisher office/Bruce Fisher re: AB/ pos allergies (Bruce Fisher=indoor molds, outdoor molds, and cockroach )   maint on symbocort 160  Did not  bring meds  Chief Complaint  Patient presents with   Medical Management of Chronic Issues   asthmatic bronchitis    Dry cough, wheezing occ   Dyspnea:  not limited  Cough: mostly daytime / not waking up with it/ assoc with lots of nasal congestion but minimal mucoid sputum Sleeping: bed is flat 3 pillows with noct cough x one year can't lie on back due to dry cough almost choking sensation  SABA use: not rescue  02:    No obvious day to day or daytime variability or assoc excess/ purulent sputum or mucus plugs or hemoptysis or cp or chest tightness,  or overt  hb symptoms.    Also denies any obvious fluctuation of symptoms with weather or environmental changes or other aggravating or alleviating factors except as outlined above   No unusual exposure hx or h/o childhood pna/ asthma or knowledge of premature birth.  Current Allergies, Complete Past Medical History, Past Surgical  History, Family History, and Social History were reviewed in [employer] record.  ROS  The following are not active complaints unless bolded Hoarseness, sore throat, dysphagia, dental problems, itching, sneezing,  nasal congestion or discharge of excess mucus or purulent secretions, ear ache,   fever, chills, sweats, unintended wt loss or wt gain, classically pleuritic or exertional cp,  orthopnea pnd or arm/hand swelling  or leg swelling, presyncope, palpitations, abdominal pain, anorexia, nausea, vomiting, diarrhea  or change in bowel habits or  change in bladder habits, change in stools or change in urine, dysuria, hematuria,  rash, arthralgias, visual complaints, headache, numbness, weakness or ataxia or problems with walking or coordination,  change in mood or  memory.        Current Meds  Medication Sig   albuterol  (VENTOLIN  HFA) 108 (90 Base) MCG/ACT inhaler Inhale 2 puffs into the lungs every 4 (four) hours as needed for wheezing or shortness of breath.   aspirin EC 81 MG tablet Take 81 mg by mouth daily. Swallow whole.   azelastine  (ASTELIN ) 0.1 % nasal spray Place 2 sprays into both nostrils 2 (two) times daily. Use in each nostril as directed   budesonide -formoterol  (SYMBICORT ) 160-4.5 MCG/ACT inhaler Inhale 2 puffs into the lungs 2 times daily at 12 noon and 4 pm.   Cholecalciferol  (VITAMIN D  PO) Take 1 tablet by mouth daily.   Coenzyme Q10 (CO Q-10) 100 MG CAPS Take 100 mg by mouth daily.   diltiazem  (CARDIZEM  CD) 120 MG 24 hr capsule Take 1 capsule (120 mg total) by mouth daily.   loratadine  (CLARITIN ) 10 MG tablet Take 1 tablet by mouth once daily   metFORMIN  (GLUCOPHAGE ) 500 MG tablet Take 1 tablet (500 mg total) by mouth daily with breakfast.   Multiple Vitamin (MULTIVITAMIN WITH MINERALS) TABS tablet Take 1 tablet by mouth daily.   olmesartan  (BENICAR ) 40 MG tablet Take 1 tablet (40 mg total) by mouth daily. For blood pressure   pantoprazole  (PROTONIX ) 40 MG tablet Take 1 tablet (40 mg total) by mouth daily.   rosuvastatin  (CRESTOR ) 20 MG tablet Take 1 tablet (20 mg total) by mouth daily. for cholesterol.   triamcinolone  (NASACORT ) 55 MCG/ACT AERO nasal inhaler Place 2 sprays into the nose at bedtime.   triamterene -hydrochlorothiazide  (MAXZIDE -25) 37.5-25 MG tablet Take 1 tablet by mouth daily.   Current Facility-Administered Medications for the 04/15/24 encounter (Office Visit) with Bruce Ozell NOVAK, MD  Medication   betamethasone  acetate-betamethasone  sodium phosphate (CELESTONE ) injection 6 mg             Past  Medical History:  Diagnosis Date   COPD (chronic obstructive pulmonary disease) (HCC)    Diabetes mellitus without complication (HCC)    Hyperlipidemia    Hypertension    Kidney stones    Rectal spasm    Renal disorder    kidney stones      Objective:    Wt Readings from Last 3 Encounters:  04/15/24 252 lb (114.3 kg)  03/19/24 249 lb 12.8 oz (113.3 kg)  03/11/24 251 lb 6.4 oz (114 kg)      Vital signs reviewed  04/15/2024  - Note at rest 02 sats  97% on RA   General appearance:    mod obese (by BMI) amb wm nad with significant nasal congestion / nasal tone to voice    HEENT : Oropharynx  clear      Nasal turbinates mod edema/ no polyps   NECK :  without  apparent  JVD/ palpable Nodes/TM    LUNGS: no acc muscle use,  Nl contour chest which is clear to A and P bilaterally without cough on insp or exp maneuvers   CV:  RRR  no s3 or murmur or increase in P2, and no edema   ABD:  soft and nontender   MS:  Gait nl   ext warm without deformities Or obvious joint restrictions  calf tenderness, cyanosis or clubbing    SKIN: warm and dry without lesions    NEURO:  alert, approp, nl sensorium with  no motor or cerebellar deficits apparent.     Assessment     Assessment & Plan Asthmatic bronchitis , chronic (HCC) Quit smoking 1995 Onset around 2022 - 2023 -CT sinus 09/22/23   Pansinusitis, appears to be acute on chronic. All paranasal sinuses affected and sinus drainage pathways opacified assoc with Rhinitis.  Nasal septum intact - Allergy  screen 11/30/2023 >  Eos 0.8 /  IgE 539 > allergy  evalindoor molds, outdoor molds, and cockroach  - Spirometry  01/24/24 FEV1 1.93 (60%)  Ratio 0.69  ? P bronchodilators? With mild concavity on f/v curve  - 04/15/2024 wean down off maint symbicort  160 and just use prn at pt request   >>> advised on immediately increasing to max doe = 2 bid prn flare of symptoms or increase need for saba over baseline (near 0)  Upper airway cough  syndrome Onset around 2022 - 2023 -CT sinus 09/22/23   Pansinusitis, appears to be acute on chronic. All paranasal sinuses affected and sinus drainage pathways opacified assoc with Rhinitis.  Nasal septum intact - Allergy  screen 11/30/2023 >  Eos 0.8 /  IgE 539 > allergy  evalindoor molds, outdoor molds, and cockroach > offered immunotherapy but declined.   >>>  max rx for gerd plus ENT referral 04/15/2024     Comment he has signicant nasal tone to his voice and sensation of globus typical of UACS with no response to rx by allergy  so ent eval next step         Each maintenance medication was reviewed in detail including emphasizing most importantly the difference between maintenance and prns and under what circumstances the prns are to be triggered using an action plan format where appropriate.  Total time for H and P, chart review, counseling, reviewing hfa  device(s) and generating customized AVS unique to this office visit / same day charting = 34 min summary final f/u ov            AVS  Patient Instructions  Symbicort  160 one twice for a week then once a day for week then stop   Pantoprazole  (protonix ) 40 mg   Take  30-60 min before first meal of the day and Pepcid  (famotidine )  20 mg after supper until return to office - this is the best way to tell whether stomach acid is contributing to your problem.    GERD (REFLUX)  is an extremely common cause of respiratory symptoms just like yours , many times with no obvious heartburn at all.    It can be treated with medication, but also with lifestyle changes including elevation of the head of your bed (ideally with 6 -8inch blocks under the headboard of your bed),  Smoking cessation, avoidance of late meals, excessive alcohol, and avoid fatty foods, chocolate, peppermint, colas, red wine, and acidic juices such as orange juice.  NO MINT OR MENTHOL PRODUCTS SO NO COUGH DROPS  (LUDENs pectin based)  USE SUGARLESS  CANDY INSTEAD (Jolley  ranchers or Stover's or Life Savers) or even ice chips will also do - the key is to swallow to prevent all throat clearing. NO OIL BASED VITAMINS - use powdered substitutes.  Avoid fish oil when coughing.    My office will be contacting you by phone for referral to [facility]   - if you don't hear back from my office within one week please call us  back or notify us  thru MyChart and we'll address it right away.    Ozell America, MD 04/15/2024

## 2024-04-15 ENCOUNTER — Encounter (INDEPENDENT_AMBULATORY_CARE_PROVIDER_SITE_OTHER): Payer: Self-pay

## 2024-04-15 ENCOUNTER — Ambulatory Visit: Admitting: Internal Medicine

## 2024-04-15 ENCOUNTER — Encounter: Payer: Self-pay | Admitting: Internal Medicine

## 2024-04-15 VITALS — BP 138/80 | HR 70 | Ht 74.0 in | Wt 252.0 lb

## 2024-04-15 DIAGNOSIS — J4489 Other specified chronic obstructive pulmonary disease: Secondary | ICD-10-CM

## 2024-04-15 DIAGNOSIS — R058 Other specified cough: Secondary | ICD-10-CM | POA: Insufficient documentation

## 2024-04-15 MED ORDER — FAMOTIDINE 20 MG PO TABS
ORAL_TABLET | ORAL | 11 refills | Status: DC
Start: 2024-04-15 — End: 2024-05-17

## 2024-04-15 NOTE — Assessment & Plan Note (Addendum)
 Onset around 2022 - 2023 -CT sinus 09/22/23   Pansinusitis, appears to be acute on chronic. All paranasal sinuses affected and sinus drainage pathways opacified assoc with Rhinitis.  Nasal septum intact - Allergy  screen 11/30/2023 >  Eos 0.8 /  IgE 539 > allergy  evalindoor molds, outdoor molds, and cockroach > offered immunotherapy but declined.   >>>  max rx for gerd plus ENT referral 04/15/2024     Comment he has signicant nasal tone to his voice and sensation of globus typical of UACS with no response to rx by allergy  so ent eval next step         Each maintenance medication was reviewed in detail including emphasizing most importantly the difference between maintenance and prns and under what circumstances the prns are to be triggered using an action plan format where appropriate.  Total time for H and P, chart review, counseling, reviewing hfa  device(s) and generating customized AVS unique to this office visit / same day charting = 34 min summary final f/u ov

## 2024-04-15 NOTE — Patient Instructions (Signed)
 Symbicort  160 one twice for a week then once a day for week then stop   Pantoprazole  (protonix ) 40 mg   Take  30-60 min before first meal of the day and Pepcid  (famotidine )  20 mg after supper until return to office - this is the best way to tell whether stomach acid is contributing to your problem.    GERD (REFLUX)  is an extremely common cause of respiratory symptoms just like yours , many times with no obvious heartburn at all.    It can be treated with medication, but also with lifestyle changes including elevation of the head of your bed (ideally with 6 -8inch blocks under the headboard of your bed),  Smoking cessation, avoidance of late meals, excessive alcohol, and avoid fatty foods, chocolate, peppermint, colas, red wine, and acidic juices such as orange juice.  NO MINT OR MENTHOL PRODUCTS SO NO COUGH DROPS  (LUDENs pectin based)  USE SUGARLESS CANDY INSTEAD (Jolley ranchers or Stover's or Life Savers) or even ice chips will also do - the key is to swallow to prevent all throat clearing. NO OIL BASED VITAMINS - use powdered substitutes.  Avoid fish oil when coughing.    My office will be contacting you by phone for referral to [facility]   - if you don't hear back from my office within one week please call us  back or notify us  thru MyChart and we'll address it right away.

## 2024-04-15 NOTE — Assessment & Plan Note (Addendum)
 Quit smoking 1995 Onset around 2022 - 2023 -CT sinus 09/22/23   Pansinusitis, appears to be acute on chronic. All paranasal sinuses affected and sinus drainage pathways opacified assoc with Rhinitis.  Nasal septum intact - Allergy  screen 11/30/2023 >  Eos 0.8 /  IgE 539 > allergy  evalindoor molds, outdoor molds, and cockroach  - Spirometry  01/24/24 FEV1 1.93 (60%)  Ratio 0.69  ? P bronchodilators? With mild concavity on f/v curve  - 04/15/2024 wean down off maint symbicort  160 and just use prn at pt request   >>> advised on immediately increasing to max doe = 2 bid prn flare of symptoms or increase need for saba over baseline (near 0)

## 2024-04-19 DIAGNOSIS — H5213 Myopia, bilateral: Secondary | ICD-10-CM | POA: Diagnosis not present

## 2024-05-08 ENCOUNTER — Ambulatory Visit: Admitting: Family Medicine

## 2024-05-12 ENCOUNTER — Emergency Department (HOSPITAL_COMMUNITY)

## 2024-05-12 ENCOUNTER — Other Ambulatory Visit: Payer: Self-pay

## 2024-05-12 ENCOUNTER — Inpatient Hospital Stay (HOSPITAL_COMMUNITY)
Admission: EM | Admit: 2024-05-12 | Discharge: 2024-05-17 | DRG: 193 | Disposition: A | Discharge status: Home / Self Care | Attending: Family Medicine | Admitting: Family Medicine

## 2024-05-12 ENCOUNTER — Encounter (HOSPITAL_COMMUNITY): Payer: Self-pay

## 2024-05-12 DIAGNOSIS — E66811 Obesity, class 1: Secondary | ICD-10-CM | POA: Diagnosis not present

## 2024-05-12 DIAGNOSIS — Z1152 Encounter for screening for COVID-19: Secondary | ICD-10-CM

## 2024-05-12 DIAGNOSIS — Z7984 Long term (current) use of oral hypoglycemic drugs: Secondary | ICD-10-CM

## 2024-05-12 DIAGNOSIS — Z7951 Long term (current) use of inhaled steroids: Secondary | ICD-10-CM

## 2024-05-12 DIAGNOSIS — R059 Cough, unspecified: Secondary | ICD-10-CM | POA: Diagnosis not present

## 2024-05-12 DIAGNOSIS — Z833 Family history of diabetes mellitus: Secondary | ICD-10-CM

## 2024-05-12 DIAGNOSIS — I700 Atherosclerosis of aorta: Secondary | ICD-10-CM | POA: Diagnosis not present

## 2024-05-12 DIAGNOSIS — J441 Chronic obstructive pulmonary disease with (acute) exacerbation: Principal | ICD-10-CM

## 2024-05-12 DIAGNOSIS — Z79899 Other long term (current) drug therapy: Secondary | ICD-10-CM | POA: Diagnosis not present

## 2024-05-12 DIAGNOSIS — Z8249 Family history of ischemic heart disease and other diseases of the circulatory system: Secondary | ICD-10-CM

## 2024-05-12 DIAGNOSIS — R062 Wheezing: Secondary | ICD-10-CM | POA: Diagnosis not present

## 2024-05-12 DIAGNOSIS — E785 Hyperlipidemia, unspecified: Secondary | ICD-10-CM | POA: Diagnosis not present

## 2024-05-12 DIAGNOSIS — J324 Chronic pansinusitis: Secondary | ICD-10-CM | POA: Diagnosis not present

## 2024-05-12 DIAGNOSIS — I10 Essential (primary) hypertension: Secondary | ICD-10-CM | POA: Diagnosis present

## 2024-05-12 DIAGNOSIS — I499 Cardiac arrhythmia, unspecified: Secondary | ICD-10-CM | POA: Diagnosis not present

## 2024-05-12 DIAGNOSIS — Z7982 Long term (current) use of aspirin: Secondary | ICD-10-CM

## 2024-05-12 DIAGNOSIS — J9601 Acute respiratory failure with hypoxia: Secondary | ICD-10-CM | POA: Diagnosis present

## 2024-05-12 DIAGNOSIS — J189 Pneumonia, unspecified organism: Secondary | ICD-10-CM | POA: Diagnosis not present

## 2024-05-12 DIAGNOSIS — K219 Gastro-esophageal reflux disease without esophagitis: Secondary | ICD-10-CM | POA: Diagnosis present

## 2024-05-12 DIAGNOSIS — J209 Acute bronchitis, unspecified: Secondary | ICD-10-CM | POA: Diagnosis not present

## 2024-05-12 DIAGNOSIS — Z87891 Personal history of nicotine dependence: Secondary | ICD-10-CM | POA: Diagnosis not present

## 2024-05-12 DIAGNOSIS — Z6831 Body mass index (BMI) 31.0-31.9, adult: Secondary | ICD-10-CM | POA: Diagnosis not present

## 2024-05-12 DIAGNOSIS — J479 Bronchiectasis, uncomplicated: Secondary | ICD-10-CM | POA: Diagnosis not present

## 2024-05-12 DIAGNOSIS — E119 Type 2 diabetes mellitus without complications: Secondary | ICD-10-CM | POA: Diagnosis not present

## 2024-05-12 DIAGNOSIS — R0602 Shortness of breath: Secondary | ICD-10-CM | POA: Diagnosis not present

## 2024-05-12 DIAGNOSIS — Z87442 Personal history of urinary calculi: Secondary | ICD-10-CM | POA: Diagnosis not present

## 2024-05-12 LAB — COMPREHENSIVE METABOLIC PANEL WITH GFR
ALT: 21 U/L (ref 0–44)
AST: 21 U/L (ref 15–41)
Albumin: 4.5 g/dL (ref 3.5–5.0)
Alkaline Phosphatase: 58 U/L (ref 38–126)
Anion gap: 11 (ref 5–15)
BUN: 15 mg/dL (ref 8–23)
CO2: 27 mmol/L (ref 22–32)
Calcium: 9.1 mg/dL (ref 8.9–10.3)
Chloride: 104 mmol/L (ref 98–111)
Creatinine, Ser: 1.02 mg/dL (ref 0.61–1.24)
GFR, Estimated: >60 mL/min (ref >60)
Glucose, Bld: 135 mg/dL — ABNORMAL HIGH (ref 70–99)
Potassium: 3.6 mmol/L (ref 3.5–5.1)
Sodium: 141 mmol/L (ref 135–145)
Total Bilirubin: 0.4 mg/dL (ref 0.0–1.2)
Total Protein: 7.8 g/dL (ref 6.5–8.1)

## 2024-05-12 LAB — CBC WITH DIFFERENTIAL/PLATELET
Abs Immature Granulocytes: 0.02 K/uL (ref 0.00–0.07)
Basophils Absolute: 0 K/uL (ref 0.0–0.1)
Basophils Relative: 0 %
Eosinophils Absolute: 1.1 K/uL — ABNORMAL HIGH (ref 0.0–0.5)
Eosinophils Relative: 10 %
HCT: 48 % (ref 39.0–52.0)
Hemoglobin: 15.8 g/dL (ref 13.0–17.0)
Immature Granulocytes: 0 %
Lymphocytes Relative: 25 %
Lymphs Abs: 2.6 K/uL (ref 0.7–4.0)
MCH: 29.7 pg (ref 26.0–34.0)
MCHC: 32.9 g/dL (ref 30.0–36.0)
MCV: 90.2 fL (ref 80.0–100.0)
Monocytes Absolute: 0.7 K/uL (ref 0.1–1.0)
Monocytes Relative: 7 %
Neutro Abs: 6.1 K/uL (ref 1.7–7.7)
Neutrophils Relative %: 58 %
Platelets: 185 K/uL (ref 150–400)
RBC: 5.32 MIL/uL (ref 4.22–5.81)
RDW: 14.3 % (ref 11.5–15.5)
WBC: 10.5 K/uL (ref 4.0–10.5)
nRBC: 0 % (ref 0.0–0.2)

## 2024-05-12 LAB — BLOOD GAS, VENOUS
Acid-Base Excess: 5.6 mmol/L — ABNORMAL HIGH (ref 0.0–2.0)
Bicarbonate: 30.6 mmol/L — ABNORMAL HIGH (ref 20.0–28.0)
Drawn by: 67355
O2 Saturation: 68.1 %
Patient temperature: 37.4
pCO2, Ven: 46 mmHg (ref 44–60)
pH, Ven: 7.43 (ref 7.25–7.43)
pO2, Ven: 39 mmHg (ref 32–45)

## 2024-05-12 LAB — GLUCOSE, CAPILLARY
Glucose-Capillary: 278 mg/dL — ABNORMAL HIGH (ref 70–99)
Glucose-Capillary: 217 mg/dL — ABNORMAL HIGH (ref 70–99)

## 2024-05-12 LAB — RESP PANEL BY RT-PCR (RSV, FLU A&B, COVID)  RVPGX2
Influenza A by PCR: NEGATIVE
Influenza B by PCR: NEGATIVE
Resp Syncytial Virus by PCR: NEGATIVE
SARS Coronavirus 2 by RT PCR: NEGATIVE

## 2024-05-12 MED ORDER — ALBUTEROL SULFATE (2.5 MG/3ML) 0.083% IN NEBU
2.5000 mg | INHALATION_SOLUTION | Freq: Three times a day (TID) | RESPIRATORY_TRACT | Status: DC
Start: 2024-05-12 — End: 2024-05-13
  Administered 2024-05-12 – 2024-05-13 (×2): 2.5 mg via RESPIRATORY_TRACT
  Filled 2024-05-12 (×2): qty 3

## 2024-05-12 MED ORDER — POLYETHYLENE GLYCOL 3350 17 G PO PACK: 17.0000 g | PACK | Freq: Every day | ORAL | Status: DC | PRN

## 2024-05-12 MED ORDER — IRBESARTAN 150 MG PO TABS
300.0000 mg | ORAL_TABLET | Freq: Every day | ORAL | Status: DC
Start: 2024-05-13 — End: 2024-05-14
  Administered 2024-05-13 – 2024-05-14 (×2): 300 mg via ORAL
  Filled 2024-05-12 (×2): qty 2

## 2024-05-12 MED ORDER — METHYLPREDNISOLONE SODIUM SUCC 125 MG IJ SOLR
125.0000 mg | Freq: Once | INTRAMUSCULAR | Status: AC
  Administered 2024-05-12: 125 mg via INTRAVENOUS
  Filled 2024-05-12: qty 2

## 2024-05-12 MED ORDER — ONDANSETRON HCL 4 MG PO TABS: 4.0000 mg | ORAL_TABLET | Freq: Four times a day (QID) | ORAL | Status: DC | PRN

## 2024-05-12 MED ORDER — ALBUTEROL SULFATE (2.5 MG/3ML) 0.083% IN NEBU
2.5000 mg | INHALATION_SOLUTION | Freq: Once | RESPIRATORY_TRACT | Status: AC
  Administered 2024-05-12: 2.5 mg via RESPIRATORY_TRACT
  Filled 2024-05-12: qty 3

## 2024-05-12 MED ORDER — SODIUM CHLORIDE 0.9 % IV SOLN
100.0000 mg | Freq: Two times a day (BID) | INTRAVENOUS | Status: DC
  Administered 2024-05-13 (×2): 100 mg via INTRAVENOUS
  Filled 2024-05-12 (×4): qty 100

## 2024-05-12 MED ORDER — ROSUVASTATIN CALCIUM 20 MG PO TABS
20.0000 mg | ORAL_TABLET | Freq: Every day | ORAL | Status: DC
  Administered 2024-05-13 – 2024-05-17 (×5): 20 mg via ORAL
  Filled 2024-05-12 (×5): qty 1

## 2024-05-12 MED ORDER — ONDANSETRON HCL 4 MG/2ML IJ SOLN: 4.0000 mg | Freq: Four times a day (QID) | INTRAMUSCULAR | Status: DC | PRN

## 2024-05-12 MED ORDER — IPRATROPIUM-ALBUTEROL 0.5-2.5 (3) MG/3ML IN SOLN
3.0000 mL | Freq: Once | RESPIRATORY_TRACT | Status: AC
  Administered 2024-05-12: 3 mL via RESPIRATORY_TRACT
  Filled 2024-05-12: qty 3

## 2024-05-12 MED ORDER — ALBUTEROL SULFATE (2.5 MG/3ML) 0.083% IN NEBU
2.5000 mg | INHALATION_SOLUTION | RESPIRATORY_TRACT | Status: DC | PRN
  Administered 2024-05-13: 2.5 mg via RESPIRATORY_TRACT
  Filled 2024-05-12: qty 3

## 2024-05-12 MED ORDER — ASPIRIN 81 MG PO TBEC
81.0000 mg | DELAYED_RELEASE_TABLET | Freq: Every day | ORAL | Status: DC
  Administered 2024-05-12 – 2024-05-17 (×6): 81 mg via ORAL
  Filled 2024-05-12 (×6): qty 1

## 2024-05-12 MED ORDER — ACETAMINOPHEN 325 MG PO TABS: 650.0000 mg | ORAL_TABLET | Freq: Four times a day (QID) | ORAL | Status: DC | PRN

## 2024-05-12 MED ORDER — TRIAMTERENE-HCTZ 37.5-25 MG PO TABS
1.0000 | ORAL_TABLET | Freq: Every day | ORAL | Status: DC
  Administered 2024-05-13 – 2024-05-17 (×5): 1 via ORAL
  Filled 2024-05-12 (×5): qty 1

## 2024-05-12 MED ORDER — SODIUM CHLORIDE 0.9 % IV SOLN
2.0000 g | INTRAVENOUS | Status: DC
  Administered 2024-05-13 – 2024-05-16 (×4): 2 g via INTRAVENOUS
  Filled 2024-05-12 (×4): qty 20

## 2024-05-12 MED ORDER — GUAIFENESIN-DM 100-10 MG/5ML PO SYRP
15.0000 mL | ORAL_SOLUTION | Freq: Three times a day (TID) | ORAL | Status: AC
  Administered 2024-05-12 – 2024-05-13 (×3): 15 mL via ORAL
  Filled 2024-05-12 (×3): qty 15

## 2024-05-12 MED ORDER — ACETAMINOPHEN 650 MG RE SUPP: 650.0000 mg | Freq: Four times a day (QID) | RECTAL | Status: DC | PRN

## 2024-05-12 MED ORDER — INSULIN ASPART 100 UNIT/ML IJ SOLN
0.0000 [IU] | Freq: Three times a day (TID) | INTRAMUSCULAR | Status: DC
  Administered 2024-05-13 – 2024-05-14 (×4): 3 [IU] via SUBCUTANEOUS
  Administered 2024-05-14: 8 [IU] via SUBCUTANEOUS
  Administered 2024-05-14: 3 [IU] via SUBCUTANEOUS
  Administered 2024-05-15: 8 [IU] via SUBCUTANEOUS
  Administered 2024-05-15: 15 [IU] via SUBCUTANEOUS
  Administered 2024-05-15: 5 [IU] via SUBCUTANEOUS
  Administered 2024-05-16: 11 [IU] via SUBCUTANEOUS
  Administered 2024-05-16: 8 [IU] via SUBCUTANEOUS
  Administered 2024-05-16: 15 [IU] via SUBCUTANEOUS
  Administered 2024-05-17: 5 [IU] via SUBCUTANEOUS

## 2024-05-12 MED ORDER — SODIUM CHLORIDE 0.9 % IV SOLN
100.0000 mg | Freq: Once | INTRAVENOUS | Status: AC
  Administered 2024-05-12: 100 mg via INTRAVENOUS
  Filled 2024-05-12 (×2): qty 100

## 2024-05-12 MED ORDER — METHYLPREDNISOLONE SODIUM SUCC 125 MG IJ SOLR
60.0000 mg | Freq: Two times a day (BID) | INTRAMUSCULAR | Status: DC
  Administered 2024-05-13 – 2024-05-17 (×9): 60 mg via INTRAVENOUS
  Filled 2024-05-12 (×9): qty 2

## 2024-05-12 MED ORDER — IOHEXOL 350 MG/ML SOLN
75.0000 mL | Freq: Once | INTRAVENOUS | Status: AC | PRN
  Administered 2024-05-12: 75 mL via INTRAVENOUS

## 2024-05-12 MED ORDER — INSULIN ASPART 100 UNIT/ML IJ SOLN
0.0000 [IU] | Freq: Every day | INTRAMUSCULAR | Status: DC
  Administered 2024-05-12 – 2024-05-17 (×3): 3 [IU] via SUBCUTANEOUS

## 2024-05-12 MED ORDER — SODIUM CHLORIDE 0.9 % IV SOLN
2.0000 g | Freq: Once | INTRAVENOUS | Status: AC
  Administered 2024-05-12: 2 g via INTRAVENOUS
  Filled 2024-05-12: qty 20

## 2024-05-12 MED ORDER — MAGNESIUM SULFATE 2 GM/50ML IV SOLN
2.0000 g | Freq: Once | INTRAVENOUS | Status: AC
  Administered 2024-05-12: 2 g via INTRAVENOUS
  Filled 2024-05-12: qty 50

## 2024-05-12 MED ORDER — DILTIAZEM HCL ER COATED BEADS 120 MG PO CP24
120.0000 mg | ORAL_CAPSULE | Freq: Every day | ORAL | Status: DC
  Administered 2024-05-12 – 2024-05-17 (×6): 120 mg via ORAL
  Filled 2024-05-12 (×6): qty 1

## 2024-05-12 MED ORDER — ALBUTEROL SULFATE (2.5 MG/3ML) 0.083% IN NEBU
2.5000 mg | INHALATION_SOLUTION | Freq: Three times a day (TID) | RESPIRATORY_TRACT | Status: DC
  Filled 2024-05-12: qty 3

## 2024-05-12 MED ORDER — ENOXAPARIN SODIUM 40 MG/0.4ML IJ SOSY
40.0000 mg | PREFILLED_SYRINGE | INTRAMUSCULAR | Status: DC
  Administered 2024-05-12 – 2024-05-16 (×5): 40 mg via SUBCUTANEOUS
  Filled 2024-05-12 (×5): qty 0.4

## 2024-05-12 NOTE — H&P (Addendum)
 History and Physical    Bruce Fisher FMW:[id number] DOB: [birth year] DOA: 05/12/2024  PCP: [name], MD   Patient coming from: Home  I have personally briefly reviewed patient's old medical records in [facility]  Chief Complaint: Difficulty breathing  HPI: Bruce Fisher is a 63 y.o. male with medical history significant for hypertension. Patient presented to the ED with complaints of difficulty breathing, and cough productive of yellow sputum that started about 2 weeks ago.  No fevers no chills.  No GI symptoms, no urinary symptoms.  Patient reports he quit smoking cigarettes about 35 years ago, and even then he did not smoke much.  He did not had any lung problems-difficulty breathing or cough, until February this year, when he had similar respiratory symptoms and was diagnosed with COPD exacerbation, he was also hypoxic.  Prior to this he had no COPD diagnosis.  He was readmitted again 3/31-for similar symptoms, CT wo contrast was unremarkable, he was again diagnosed with COPD exacerbation with hypoxia. He was discharged from the hospital with albuterol  inhaler.  And if he does not use his this inhaler, his symptoms return and improve with use.   He subsequently followed up with the pulmonologist, and was told he had no lung problems.   ED Course: Tmax 99.4.  Heart rate 84-96.  Respiratory rate 19-22.  Blood pressure systolic 136-162.  O2 sats 87% on room air, placed on 4 to 5 L.  Sats greater than 92%. COVID influenza RSV negative.  WBC 10.5. VBG with pH of 7.4, pCO2 of 46 CTA chest-negative for PE.  Areas of ground glass and reticular densities at the lung bases concerning for atypical infection or aspiration. IV ceftriaxone  and doxycycline  started. DuoNebs, magnesium  2 g given.  Review of Systems: As per HPI all other systems reviewed and negative.  Past Medical History:  Diagnosis Date   Diabetes mellitus without complication (HCC)    Hyperlipidemia    Hypertension     Kidney stones    Rectal spasm     Past Surgical History:  Procedure Laterality Date   SPINE SURGERY     lower back     reports that he quit smoking about 30 years ago. His smoking use included cigarettes. He has never used smokeless tobacco. He reports that he does not drink alcohol and does not use drugs.  No Known Allergies  Family History  Problem Relation Age of Onset   Diabetes Mother    Heart failure Mother    Cancer Father        Asbestos   Cancer Sister        breast   Heart disease Sister        valve   Allergic rhinitis Neg Hx    Prior to Admission medications   Medication Sig Start Date End Date Taking? Authorizing Provider  albuterol  (VENTOLIN  HFA) 108 (90 Base) MCG/ACT inhaler Inhale 2 puffs into the lungs every 4 (four) hours as needed for wheezing or shortness of breath. 09/24/23   Johnson, Clanford L, MD  aspirin EC 81 MG tablet Take 81 mg by mouth daily. Swallow whole.    [provider]  azelastine  (ASTELIN ) 0.1 % nasal spray Place 2 sprays into both nostrils 2 (two) times daily. Use in each nostril as directed 03/11/24   [name], MD  budesonide -formoterol  (SYMBICORT ) 160-4.5 MCG/ACT inhaler Inhale 2 puffs into the lungs 2 times daily at 12 noon and 4 pm. 10/24/23   Ricky Fines, MD  Cholecalciferol  (VITAMIN D  PO) Take 1 tablet by mouth daily.    [provider]  Coenzyme Q10 (CO Q-10) 100 MG CAPS Take 100 mg by mouth daily. 06/22/22   [name], MD  diltiazem  (CARDIZEM  CD) 120 MG 24 hr capsule Take 1 capsule (120 mg total) by mouth daily. 03/11/24   [name], MD  famotidine  (PEPCID ) 20 MG tablet One an hour before bed 04/15/24   Darlean Ozell NOVAK, MD  loratadine  (CLARITIN ) 10 MG tablet Take 1 tablet by mouth once daily 02/12/24   [name], MD  metFORMIN  (GLUCOPHAGE ) 500 MG tablet Take 1 tablet (500 mg total) by mouth daily with breakfast. 03/11/24   [name], MD  Multiple Vitamin (MULTIVITAMIN WITH MINERALS) TABS  tablet Take 1 tablet by mouth daily.    [provider]  olmesartan  (BENICAR ) 40 MG tablet Take 1 tablet (40 mg total) by mouth daily. For blood pressure 03/11/24   [name], MD  pantoprazole  (PROTONIX ) 40 MG tablet Take 1 tablet (40 mg total) by mouth daily. 10/24/23 10/23/24  Ricky Fines, MD  rosuvastatin  (CRESTOR ) 20 MG tablet Take 1 tablet (20 mg total) by mouth daily. for cholesterol. 03/11/24   [name], MD  triamcinolone  (NASACORT ) 55 MCG/ACT AERO nasal inhaler Place 2 sprays into the nose at bedtime. 08/24/23   Dettinger, Fonda LABOR, MD  triamterene -hydrochlorothiazide  (MAXZIDE -25) 37.5-25 MG tablet Take 1 tablet by mouth daily. 02/28/24   [provider]    Physical Exam: Vitals:   05/12/24 1302 05/12/24 1306 05/12/24 1400 05/12/24 1430  BP: 136/87  (!) 146/79 (!) 144/75  Pulse: 90 84 83 66  Resp: (!) 21 (!) 22 (!) 22 19  Temp:      TempSrc:      SpO2: 92% 93% 93% 95%  Weight:      Height:        Constitutional: NAD, calm, comfortable Vitals:   05/12/24 1302 05/12/24 1306 05/12/24 1400 05/12/24 1430  BP: 136/87  (!) 146/79 (!) 144/75  Pulse: 90 84 83 66  Resp: (!) 21 (!) 22 (!) 22 19  Temp:      TempSrc:      SpO2: 92% 93% 93% 95%  Weight:      Height:       Eyes: PERRL, lids and conjunctivae normal ENMT: Mucous membranes are moist. Posterior pharynx clear of any exudate or lesions.  Neck: normal, supple, no masses, no thyromegaly Respiratory: Diffuse expiratory rhonchi, Normal respiratory effort. No accessory muscle use.  Cardiovascular: Regular rate and rhythm, no murmurs / rubs / gallops. No extremity edema.  Abdomen: no tenderness, no masses palpated. No hepatosplenomegaly. Bowel sounds positive.  Musculoskeletal: no clubbing / cyanosis. No joint deformity upper and lower extremities.  Skin: no rashes, lesions, ulcers. No induration Neurologic: No facial asymmetry, moving extremities spontaneously, speech fluent.  Psychiatric: Normal  judgment and insight. Alert and oriented x 3. Normal mood.   Labs on Admission: I have personally reviewed following labs and imaging studies  CBC: Recent Labs  Lab 05/12/24 1231  WBC 10.5  NEUTROABS 6.1  HGB 15.8  HCT 48.0  MCV 90.2  PLT 185   Basic Metabolic Panel: Recent Labs  Lab 05/12/24 1231  NA 141  K 3.6  CL 104  CO2 27  GLUCOSE 135*  BUN 15  CREATININE 1.02  CALCIUM  9.1   GFR: Estimated Creatinine Clearance: 99.9 mL/min (by C-G formula based on SCr of 1.02 mg/dL). Liver Function Tests: Recent Labs  Lab  05/12/24 1231  AST 21  ALT 21  ALKPHOS 58  BILITOT 0.4  PROT 7.8  ALBUMIN 4.5   Radiological Exams on Admission: CT Angio Chest PE W and/or Wo Contrast Result Date: 05/12/2024 CLINICAL DATA:  Concern for pulmonary embolism. EXAM: CT ANGIOGRAPHY CHEST WITH CONTRAST TECHNIQUE: Multidetector CT imaging of the chest was performed using the standard protocol during bolus administration of intravenous contrast. Multiplanar CT image reconstructions and MIPs were obtained to evaluate the vascular anatomy. RADIATION DOSE REDUCTION: This exam was performed according to the departmental dose-optimization program which includes automated exposure control, adjustment of the mA and/or kV according to patient size and/or use of iterative reconstruction technique. CONTRAST:  75mL OMNIPAQUE IOHEXOL 350 MG/ML SOLN COMPARISON:  CT dated 09/22/2023. FINDINGS: Evaluation of this exam is limited due to respiratory motion. Cardiovascular: There is no cardiomegaly or pericardial effusion. Mild atherosclerotic calcification of the thoracic aorta. No aneurysmal dilatation or dissection. The origins of the great vessels of the aortic arch are patent. Evaluation of the pulmonary arteries is limited due to respiratory motion. No central pulmonary artery embolus identified. Mediastinum/Nodes: No hilar or mediastinal adenopathy. The esophagus and the thyroid gland are grossly unremarkable no  mediastinal fluid collection. Lungs/Pleura: Mild bilateral lower lobe bronchiectasis. Areas of ground-glass and reticular densities at the lung bases bilaterally concerning for atypical infection or aspiration. No consolidative changes. There is no pleural effusion pneumothorax. Aspirated content or mucous secretions noted in the bilateral lower lobe distal bronchi. The central airways are patent. Upper Abdomen: No acute abnormality. Musculoskeletal: Degenerative changes of the spine. No acute osseous pathology. Review of the MIP images confirms the above findings. IMPRESSION: 1. No CT evidence of central pulmonary artery embolus. 2. Areas of ground-glass and reticular densities at the lung bases bilaterally concerning for atypical infection or aspiration. 3.  Aortic Atherosclerosis (ICD10-I70.0). Electronically Signed   By: Vanetta Chou M.D.   On: 05/12/2024 15:15   DG Chest Portable 1 View Result Date: 05/12/2024 CLINICAL DATA:  Shortness of breath. EXAM: PORTABLE CHEST 1 VIEW COMPARISON:  Chest radiograph dated 10/23/2023. FINDINGS: The heart size and mediastinal contours are within normal limits. Both lungs are clear. The visualized skeletal structures are unremarkable. IMPRESSION: No active disease. Electronically Signed   By: Vanetta Chou M.D.   On: 05/12/2024 13:29   EKG: Independently reviewed.  Sinus rhythm, rate 90, QTc 447.  Assessment/Plan Principal Problem:   Acute hypoxemic respiratory failure (HCC) Active Problems:   PNA (pneumonia)   Type 2 diabetes mellitus without complication, without long-term current use of insulin  (HCC)   Essential hypertension   Assessment and Plan:  Acute hypoxic respiratory failure-O2 sats down to 87% on room air, 4/5 L sats greater than 92%.  Likely secondary to pneumonia and acute bronchitis.  Pneumonia-ruled out for sepsis.  Afebrile.  WBC 10.5.  Present with dyspnea and productive cough of 2 weeks duration.  CTA chest negative for PE,  bilateral ground glass and reticular densities at lung bases-atypical infection or aspiration. - IV ceftriaxone  and doxycycline  started in ED, continue - Mucolytics, DuoNebs - Speech therapy swallow evaluation  Acute bronchitis- diffuse rhonchi on exam. COVID influenza RSV negative.  Quit smoking cigarettes about 35 years ago, and was not a heavy smoker.  Has not had any lung symptoms or prior COPD diagnosis until February this year, when he presented with similar symptoms and was diagnosed with COPD.  Subsequently followed up with pulmonologist as outpatient and was told he had no lung disease. -Albuterol   nebs - IV 125 mg Solu-Medrol , continue 60 twice daily  Diabetes mellitus-controlled.  A1c 03/11/2024 5.9. - Hold metformin  - SSI- M - monitor glucose while on steroids  Hypertension-blood pressure systolic 130s to 839d - Resume olmesartan , triamterene -HCTZ, metoprolol    DVT prophylaxis:Lovenox  Code Status: FULL Family Communication: Spouse at bedside Disposition Plan: ~ 2 days Consults called: None  Admission status:  Inpt Tele I certify that at the point of admission it is my clinical judgment that the patient will require inpatient hospital care spanning beyond 2 midnights from the point of admission due to high intensity of service, high risk for further deterioration and high frequency of surveillance required.   Author: Tully FORBES Carwin, MD 05/12/2024 5:46 PM  For on call review [email or web address].

## 2024-05-12 NOTE — ED Triage Notes (Addendum)
 Pt comes by EMS for SOB. Pt has been SOB for the past few days and was d/c from the ICU a couple of days ago. Pt has a productive cough with yellow phlegm  99.4 axillary 88% on RA Pt was given IV solumedrol and duo nebulizer   Pt was placed on 4L [place] 20G L AC

## 2024-05-12 NOTE — ED Notes (Signed)
 Report given to Rowene Suto RN of [facility] at this time. No questions, comments, or concerns after report was given.

## 2024-05-13 DIAGNOSIS — J9601 Acute respiratory failure with hypoxia: Secondary | ICD-10-CM | POA: Diagnosis not present

## 2024-05-13 LAB — CBC
HCT: 46.7 % (ref 39.0–52.0)
Hemoglobin: 15.4 g/dL (ref 13.0–17.0)
MCH: 29.7 pg (ref 26.0–34.0)
MCHC: 33 g/dL (ref 30.0–36.0)
MCV: 90 fL (ref 80.0–100.0)
Platelets: 200 K/uL (ref 150–400)
RBC: 5.19 MIL/uL (ref 4.22–5.81)
RDW: 14.4 % (ref 11.5–15.5)
WBC: 13 K/uL — ABNORMAL HIGH (ref 4.0–10.5)
nRBC: 0 % (ref 0.0–0.2)

## 2024-05-13 LAB — BASIC METABOLIC PANEL WITH GFR
Anion gap: 11 (ref 5–15)
BUN: 18 mg/dL (ref 8–23)
CO2: 24 mmol/L (ref 22–32)
Calcium: 8.7 mg/dL — ABNORMAL LOW (ref 8.9–10.3)
Chloride: 106 mmol/L (ref 98–111)
Creatinine, Ser: 0.95 mg/dL (ref 0.61–1.24)
GFR, Estimated: >60 mL/min (ref >60)
Glucose, Bld: 161 mg/dL — ABNORMAL HIGH (ref 70–99)
Potassium: 4 mmol/L (ref 3.5–5.1)
Sodium: 140 mmol/L (ref 135–145)

## 2024-05-13 LAB — GLUCOSE, CAPILLARY
Glucose-Capillary: 167 mg/dL — ABNORMAL HIGH (ref 70–99)
Glucose-Capillary: 161 mg/dL — ABNORMAL HIGH (ref 70–99)
Glucose-Capillary: 176 mg/dL — ABNORMAL HIGH (ref 70–99)
Glucose-Capillary: 193 mg/dL — ABNORMAL HIGH (ref 70–99)

## 2024-05-13 MED ORDER — METHYLPREDNISOLONE SODIUM SUCC 40 MG IJ SOLR
40.0000 mg | Freq: Once | INTRAMUSCULAR | Status: AC
  Administered 2024-05-13: 40 mg via INTRAVENOUS
  Filled 2024-05-13: qty 1

## 2024-05-13 MED ORDER — IPRATROPIUM-ALBUTEROL 0.5-2.5 (3) MG/3ML IN SOLN
3.0000 mL | RESPIRATORY_TRACT | Status: DC | PRN
  Administered 2024-05-13 – 2024-05-14 (×2): 3 mL via RESPIRATORY_TRACT
  Filled 2024-05-13 (×3): qty 3

## 2024-05-13 MED ORDER — ARFORMOTEROL TARTRATE 15 MCG/2ML IN NEBU
15.0000 ug | INHALATION_SOLUTION | Freq: Two times a day (BID) | RESPIRATORY_TRACT | Status: DC
  Administered 2024-05-13 – 2024-05-17 (×8): 15 ug via RESPIRATORY_TRACT
  Filled 2024-05-13 (×8): qty 2

## 2024-05-13 MED ORDER — BUDESONIDE 0.25 MG/2ML IN SUSP
0.2500 mg | Freq: Two times a day (BID) | RESPIRATORY_TRACT | Status: DC
  Administered 2024-05-13 – 2024-05-17 (×8): 0.25 mg via RESPIRATORY_TRACT
  Filled 2024-05-13 (×8): qty 2

## 2024-05-13 NOTE — ED Provider Notes (Signed)
 [facility] MEDICAL SURGICAL UNIT Provider Note   CSN: [id number] Arrival date & time: 05/12/24  1208     Patient presents with: Shortness of Breath   Bruce Fisher is a 63 y.o. male.   Patient complains of shortness of breath.  Patient has a history of COPD  The history is provided by the patient and medical records. No language interpreter was used.  Shortness of Breath Severity:  Moderate Onset quality:  Sudden Timing:  Constant Progression:  Worsening Chronicity:  New Context: activity   Relieved by:  Nothing Worsened by:  Nothing Ineffective treatments:  None tried Associated symptoms: wheezing   Associated symptoms: no abdominal pain, no chest pain, no cough, no headaches and no rash        Prior to Admission medications   Medication Sig Start Date End Date Taking? Authorizing Provider  irbesartan  (AVAPRO ) 150 MG tablet Take 150 mg by mouth daily. 04/14/24  Yes [provider]  albuterol  (VENTOLIN  HFA) 108 (90 Base) MCG/ACT inhaler Inhale 2 puffs into the lungs every 4 (four) hours as needed for wheezing or shortness of breath. 09/24/23   Johnson, Clanford L, MD  aspirin EC 81 MG tablet Take 81 mg by mouth daily. Swallow whole.    [provider]  azelastine  (ASTELIN ) 0.1 % nasal spray Place 2 sprays into both nostrils 2 (two) times daily. Use in each nostril as directed 03/11/24   [name], MD  budesonide -formoterol  (SYMBICORT ) 160-4.5 MCG/ACT inhaler Inhale 2 puffs into the lungs 2 times daily at 12 noon and 4 pm. 10/24/23   Ricky Fines, MD  Cholecalciferol  (VITAMIN D  PO) Take 1 tablet by mouth daily.    [provider]  Coenzyme Q10 (CO Q-10) 100 MG CAPS Take 100 mg by mouth daily. 06/22/22   [name], MD  diltiazem  (CARDIZEM  CD) 120 MG 24 hr capsule Take 1 capsule (120 mg total) by mouth daily. 03/11/24   [name], MD  famotidine  (PEPCID ) 20 MG tablet One an hour before bed 04/15/24   Darlean Ozell NOVAK, MD  loratadine   (CLARITIN ) 10 MG tablet Take 1 tablet by mouth once daily 02/12/24   [name], MD  metFORMIN  (GLUCOPHAGE ) 500 MG tablet Take 1 tablet (500 mg total) by mouth daily with breakfast. 03/11/24   [name], MD  Multiple Vitamin (MULTIVITAMIN WITH MINERALS) TABS tablet Take 1 tablet by mouth daily.    [provider]  olmesartan  (BENICAR ) 40 MG tablet Take 1 tablet (40 mg total) by mouth daily. For blood pressure 03/11/24   [name], MD  pantoprazole  (PROTONIX ) 40 MG tablet Take 1 tablet (40 mg total) by mouth daily. 10/24/23 10/23/24  Ricky Fines, MD  rosuvastatin  (CRESTOR ) 20 MG tablet Take 1 tablet (20 mg total) by mouth daily. for cholesterol. 03/11/24   [name], MD  triamcinolone  (NASACORT ) 55 MCG/ACT AERO nasal inhaler Place 2 sprays into the nose at bedtime. 08/24/23   Dettinger, Fonda LABOR, MD  triamterene -hydrochlorothiazide  (MAXZIDE -25) 37.5-25 MG tablet Take 1 tablet by mouth daily. 02/28/24   [provider]    Allergies: Patient has no known allergies.    Review of Systems  Constitutional:  Negative for appetite change and fatigue.  HENT:  Negative for congestion, ear discharge and sinus pressure.   Eyes:  Negative for discharge.  Respiratory:  Positive for shortness of breath and wheezing. Negative for cough.   Cardiovascular:  Negative for chest pain.  Gastrointestinal:  Negative for abdominal pain and diarrhea.  Genitourinary:  Negative for frequency and hematuria.  Musculoskeletal:  Negative for back pain.  Skin:  Negative for rash.  Neurological:  Negative for seizures and headaches.  Psychiatric/Behavioral:  Negative for hallucinations.     Updated Vital Signs BP (!) 160/95 (BP Location: Right Arm)   Pulse (!) 110   Temp 98.6 F (37 C) (Oral)   Resp 18   Ht 6' 2 (1.88 m)   Wt 111.3 kg   SpO2 91%   BMI 31.49 kg/m   Physical Exam Vitals and nursing note reviewed.  Constitutional:      Appearance: He is well-developed.  HENT:      Head: Normocephalic.     Nose: Nose normal.  Eyes:     General: No scleral icterus.    Conjunctiva/sclera: Conjunctivae normal.  Neck:     Thyroid: No thyromegaly.  Cardiovascular:     Rate and Rhythm: Normal rate and regular rhythm.     Heart sounds: No murmur heard.    No friction rub. No gallop.  Pulmonary:     Breath sounds: No stridor. Wheezing present. No rales.  Chest:     Chest wall: No tenderness.  Abdominal:     General: There is no distension.     Tenderness: There is no abdominal tenderness. There is no rebound.  Musculoskeletal:        General: Normal range of motion.     Cervical back: Neck supple.  Lymphadenopathy:     Cervical: No cervical adenopathy.  Skin:    Findings: No erythema or rash.  Neurological:     Mental Status: He is alert and oriented to person, place, and time.     Motor: No abnormal muscle tone.     Coordination: Coordination normal.  Psychiatric:        Behavior: Behavior normal.     (all labs ordered are listed, but only abnormal results are displayed) Labs Reviewed  CBC WITH DIFFERENTIAL/PLATELET - Abnormal; Notable for the following components:      Result Value   Eosinophils Absolute 1.1 (*)    All other components within normal limits  COMPREHENSIVE METABOLIC PANEL WITH GFR - Abnormal; Notable for the following components:   Glucose, Bld 135 (*)    All other components within normal limits  BLOOD GAS, VENOUS - Abnormal; Notable for the following components:   Bicarbonate 30.6 (*)    Acid-Base Excess 5.6 (*)    All other components within normal limits  BASIC METABOLIC PANEL WITH GFR - Abnormal; Notable for the following components:   Glucose, Bld 161 (*)    Calcium  8.7 (*)    All other components within normal limits  CBC - Abnormal; Notable for the following components:   WBC 13.0 (*)    All other components within normal limits  GLUCOSE, CAPILLARY - Abnormal; Notable for the following components:   Glucose-Capillary 278  (*)    All other components within normal limits  GLUCOSE, CAPILLARY - Abnormal; Notable for the following components:   Glucose-Capillary 217 (*)    All other components within normal limits  GLUCOSE, CAPILLARY - Abnormal; Notable for the following components:   Glucose-Capillary 167 (*)    All other components within normal limits  GLUCOSE, CAPILLARY - Abnormal; Notable for the following components:   Glucose-Capillary 161 (*)    All other components within normal limits  GLUCOSE, CAPILLARY - Abnormal; Notable for the following components:   Glucose-Capillary 176 (*)    All other components within normal  limits  RESP PANEL BY RT-PCR (RSV, FLU A&B, COVID)  [id number]    EKG: None  Radiology: CT Angio Chest PE W and/or Wo Contrast Result Date: 05/12/2024 CLINICAL DATA:  Concern for pulmonary embolism. EXAM: CT ANGIOGRAPHY CHEST WITH CONTRAST TECHNIQUE: Multidetector CT imaging of the chest was performed using the standard protocol during bolus administration of intravenous contrast. Multiplanar CT image reconstructions and MIPs were obtained to evaluate the vascular anatomy. RADIATION DOSE REDUCTION: This exam was performed according to the departmental dose-optimization program which includes automated exposure control, adjustment of the mA and/or kV according to patient size and/or use of iterative reconstruction technique. CONTRAST:  75mL OMNIPAQUE IOHEXOL 350 MG/ML SOLN COMPARISON:  CT dated 09/22/2023. FINDINGS: Evaluation of this exam is limited due to respiratory motion. Cardiovascular: There is no cardiomegaly or pericardial effusion. Mild atherosclerotic calcification of the thoracic aorta. No aneurysmal dilatation or dissection. The origins of the great vessels of the aortic arch are patent. Evaluation of the pulmonary arteries is limited due to respiratory motion. No central pulmonary artery embolus identified. Mediastinum/Nodes: No hilar or mediastinal adenopathy. The esophagus and the  thyroid gland are grossly unremarkable no mediastinal fluid collection. Lungs/Pleura: Mild bilateral lower lobe bronchiectasis. Areas of ground-glass and reticular densities at the lung bases bilaterally concerning for atypical infection or aspiration. No consolidative changes. There is no pleural effusion pneumothorax. Aspirated content or mucous secretions noted in the bilateral lower lobe distal bronchi. The central airways are patent. Upper Abdomen: No acute abnormality. Musculoskeletal: Degenerative changes of the spine. No acute osseous pathology. Review of the MIP images confirms the above findings. IMPRESSION: 1. No CT evidence of central pulmonary artery embolus. 2. Areas of ground-glass and reticular densities at the lung bases bilaterally concerning for atypical infection or aspiration. 3.  Aortic Atherosclerosis (ICD10-I70.0). Electronically Signed   By: Vanetta Chou M.D.   On: 05/12/2024 15:15   DG Chest Portable 1 View Result Date: 05/12/2024 CLINICAL DATA:  Shortness of breath. EXAM: PORTABLE CHEST 1 VIEW COMPARISON:  Chest radiograph dated 10/23/2023. FINDINGS: The heart size and mediastinal contours are within normal limits. Both lungs are clear. The visualized skeletal structures are unremarkable. IMPRESSION: No active disease. Electronically Signed   By: Vanetta Chou M.D.   On: 05/12/2024 13:29     Procedures   Medications Ordered in the ED  aspirin EC tablet 81 mg (81 mg Oral Given 05/13/24 0900)  insulin  aspart (novoLOG ) injection 0-5 Units (3 Units Subcutaneous Given 05/12/24 2136)  cefTRIAXone  (ROCEPHIN ) 2 g in sodium chloride  0.9 % 100 mL IVPB (2 g Intravenous New Bag/Given 05/13/24 1559)  enoxaparin  (LOVENOX ) injection 40 mg (40 mg Subcutaneous Given 05/13/24 1714)  methylPREDNISolone  sodium succinate (SOLU-MEDROL ) 125 mg/2 mL injection 60 mg (60 mg Intravenous Given 05/13/24 0901)  acetaminophen  (TYLENOL ) tablet 650 mg (has no administration in time range)    Or   acetaminophen  (TYLENOL ) suppository 650 mg (has no administration in time range)  ondansetron  (ZOFRAN ) tablet 4 mg (has no administration in time range)    Or  ondansetron  (ZOFRAN ) injection 4 mg (has no administration in time range)  polyethylene glycol (MIRALAX / GLYCOLAX) packet 17 g (has no administration in time range)  insulin  aspart (novoLOG ) injection 0-15 Units (3 Units Subcutaneous Given 05/13/24 1647)  diltiazem  (CARDIZEM  CD) 24 hr capsule 120 mg (120 mg Oral Given 05/13/24 0901)  irbesartan  (AVAPRO ) tablet 300 mg (300 mg Oral Given 05/13/24 0900)  triamterene -hydrochlorothiazide  (MAXZIDE -25) 37.5-25 MG per tablet 1 tablet (1 tablet Oral Given  05/13/24 0900)  rosuvastatin  (CRESTOR ) tablet 20 mg (20 mg Oral Given 05/13/24 0900)  ipratropium-albuterol  (DUONEB) 0.5-2.5 (3) MG/3ML nebulizer solution 3 mL (has no administration in time range)  arformoterol  (BROVANA ) nebulizer solution 15 mcg (15 mcg Nebulization Given 05/13/24 1345)  budesonide  (PULMICORT ) nebulizer solution 0.25 mg (0.25 mg Nebulization Given 05/13/24 1345)  magnesium  sulfate IVPB 2 g 50 mL (0 g Intravenous Stopped 05/12/24 1500)  iohexol (OMNIPAQUE) 350 MG/ML injection 75 mL (75 mLs Intravenous Contrast Given 05/12/24 1451)  ipratropium-albuterol  (DUONEB) 0.5-2.5 (3) MG/3ML nebulizer solution 3 mL (3 mLs Nebulization Given 05/12/24 1535)  albuterol  (PROVENTIL ) (2.5 MG/3ML) 0.083% nebulizer solution 2.5 mg (2.5 mg Nebulization Given 05/12/24 1535)  doxycycline  (VIBRAMYCIN ) 100 mg in sodium chloride  0.9 % 250 mL IVPB (100 mg Intravenous New Bag/Given 05/12/24 1734)  cefTRIAXone  (ROCEPHIN ) 2 g in sodium chloride  0.9 % 100 mL IVPB (2 g Intravenous New Bag/Given 05/12/24 1533)  guaiFENesin -dextromethorphan  (ROBITUSSIN DM) 100-10 MG/5ML syrup 15 mL (15 mLs Oral Given 05/13/24 1332)  methylPREDNISolone  sodium succinate (SOLU-MEDROL ) 125 mg/2 mL injection 125 mg (125 mg Intravenous Given 05/12/24 1821)  methylPREDNISolone   sodium succinate (SOLU-MEDROL ) 40 mg/mL injection 40 mg (40 mg Intravenous Given 05/13/24 1731)                                    Medical Decision Making Amount and/or Complexity of Data Reviewed Labs: ordered. Radiology: ordered.  Risk Prescription drug management. Decision regarding hospitalization.  Patient with COPD exacerbation.  He will be admitted to medicine     Final diagnoses:  COPD exacerbation (HCC)  Community acquired pneumonia, unspecified laterality    ED Discharge Orders     None          Suzette Pac, MD 05/13/24 1744

## 2024-05-13 NOTE — Plan of Care (Signed)
  Problem: Education: Goal: Knowledge of General Education information will improve Description: Including pain rating scale, medication(s)/side effects and non-pharmacologic comfort measures Outcome: Adequate for Discharge   Problem: Clinical Measurements: Goal: Ability to maintain clinical measurements within normal limits will improve Outcome: Progressing Goal: Diagnostic test results will improve Outcome: Adequate for Discharge

## 2024-05-13 NOTE — TOC CM/SW Note (Signed)
 Transition of Care [facility]) - Inpatient Brief Assessment   Patient Details  Name: Bruce Fisher MRN: [id number] Date of Birth: Jul 02, 1961  Transition of Care [facility]) CM/SW Contact:    Noreen KATHEE Cleotilde ISRAEL Phone Number: 05/13/2024, 8:31 AM   Clinical Narrative:  Inpatient Care Management (ICM) has reviewed patient and no other ICM needs have been identified at this time. We will continue to monitor patient advancement through interdisciplinary progression rounds. If new patient transition needs arise, please place a ICM consult.  Transition of Care Asessment: Insurance and Status: Insurance coverage has been reviewed Patient has primary care physician: Yes Home environment has been reviewed: Single Family Home Prior level of function:: Independent Prior/Current Home Services: No current home services Social Drivers of Health Review: SDOH reviewed no interventions necessary Readmission risk has been reviewed: Yes Transition of care needs: no transition of care needs at this time

## 2024-05-13 NOTE — Plan of Care (Signed)

## 2024-05-13 NOTE — Evaluation (Signed)
 Clinical/Bedside Swallow Evaluation Patient Details  Name: Bruce Fisher MRN: [id number] Date of Birth: Jun 14, 1961  Today's Date: 05/13/2024 Time: SLP Start Time (ACUTE ONLY): 1355 SLP Stop Time (ACUTE ONLY): 1422 SLP Time Calculation (min) (ACUTE ONLY): 27 min  Past Medical History:  Past Medical History:  Diagnosis Date   Diabetes mellitus without complication (HCC)    Hyperlipidemia    Hypertension    Kidney stones    Rectal spasm    Past Surgical History:  Past Surgical History:  Procedure Laterality Date   SPINE SURGERY     lower back   HPI:  63 year old male with medical history significant for hypertension presented to the ER with complaints of difficulty breathing, wheezing and cough productive of yellowish sputum that started about 2 weeks ago.  Patient apparently had similar symptoms in the beginning of this year during which she was hospitalized.  Sounds like he had another flareup after that  He had seen pulmonology recently.  He says he does not have COPD.  He is frustrated with frequent flareups.  He says he used to take Symbicort  scheduled but his symptoms would flare up when he stopped taking Symbicort .  Patient admitted with wheezing, hypoxia with CT findings of some ground glass opacities on the basis. He has seen Dr. Darlean (Pulmonology) and Dr. Iva (Allergy ) and has an appointment with Dr. Okey (ENT) next months.    Assessment / Plan / Recommendation  Clinical Impression  Clinical swallow evaluation completed at bedside with wife present in room. Bruce Fisher reports feeling frustrated about his breathing symptoms and relayed seeing pulmonology. He stated that Dr. Darlean told him that his lungs are fine but suggested that his symptoms may be more related to GERD. Pt's wife does report that Pt often clears his throat after meals. He stated that he tried elevating HOB and that it did not alleviate symptoms (difficulty breathing and coughing). Pt endorses sinus  drainage. Oral motor exam is WNL. Pt assessed with ice chips, thin water via cup/straw, puree, and regular textures and exhibits no overt signs or symptoms of aspiration and does not report globus. Recommend regular textures and thin liquids with standard aspiration and reflux precautions. Pt was encouraged to replace throught clearing with a dry swallow or a quick cough and swallow. He will see the ENT in early November. No further acute SLP needs at this time. SLP will sign off. SLP Visit Diagnosis: Dysphagia, unspecified (R13.10)    Aspiration Risk  No limitations    Diet Recommendation Regular;Thin liquid    Liquid Administration via: Cup;Straw Medication Administration: Whole meds with liquid Supervision: Patient able to self feed Postural Changes: Seated upright at 90 degrees;Remain upright for at least 30 minutes after po intake    Other  Recommendations Oral Care Recommendations: Oral care BID     Assistance Recommended at Discharge    Functional Status Assessment Patient has not had a recent decline in their functional status  Frequency and Duration            Prognosis        Swallow Study   General Date of Onset: 05/12/24 HPI: 63 year old male with medical history significant for hypertension presented to the ER with complaints of difficulty breathing, wheezing and cough productive of yellowish sputum that started about 2 weeks ago.  Patient apparently had similar symptoms in the beginning of this year during which she was hospitalized.  Sounds like he had another flareup after that  He had seen pulmonology  recently.  He says he does not have COPD.  He is frustrated with frequent flareups.  He says he used to take Symbicort  scheduled but his symptoms would flare up when he stopped taking Symbicort .  Patient admitted with wheezing, hypoxia with CT findings of some ground glass opacities on the basis. He has seen Dr. Darlean (Pulmonology) and Dr. Iva (Allergy ) and has an  appointment with Dr. Okey (ENT) next months. Type of Study: Bedside Swallow Evaluation Previous Swallow Assessment: N/A Diet Prior to this Study: Regular;Thin liquids (Level 0) Temperature Spikes Noted: No Respiratory Status: Nasal cannula History of Recent Intubation: No Behavior/Cognition: Alert;Cooperative;Pleasant mood Oral Cavity Assessment: Within Functional Limits Oral Care Completed by SLP: No Oral Cavity - Dentition: Adequate natural dentition Vision: Functional for self-feeding Self-Feeding Abilities: Able to feed self Patient Positioning: Upright in chair (edge of bed) Baseline Vocal Quality: Normal Volitional Cough: Strong Volitional Swallow: Able to elicit    Oral/Motor/Sensory Function Overall Oral Motor/Sensory Function: Within functional limits   Ice Chips Ice chips: Within functional limits Presentation: Spoon   Thin Liquid Thin Liquid: Within functional limits Presentation: Cup;Self Fed;Straw    Nectar Thick Nectar Thick Liquid: Not tested   Honey Thick Honey Thick Liquid: Not tested   Puree Puree: Within functional limits Presentation: Self Fed;Spoon   Solid     Solid: Within functional limits Presentation: Self Fed     Thank you,  Lamar Candy, CCC-SLP [phone]  Kaniah Rizzolo 05/13/2024,4:04 PM

## 2024-05-13 NOTE — Progress Notes (Signed)
 PROGRESS NOTE    Bruce Fisher  FMW:[id number] DOB: 05/14/1961 DOA: 05/12/2024 PCP: [name], MD   Brief Narrative:   63 year old male with medical history significant for hypertension presented to the ER with complaints of difficulty breathing, wheezing and cough productive of yellowish sputum that started about 2 weeks ago.  Patient apparently had similar symptoms in the beginning of this year during which she was hospitalized.  Sounds like he had another flareup after that  He had seen pulmonology recently.  He says he does not have COPD.  He is frustrated with frequent flareups.  He says he used to take Symbicort  scheduled but his symptoms would flare up when he stopped taking Symbicort . Patient admitted with wheezing, hypoxia with CT findings of some ground glass opacities on the basis.  Assessment & Plan:   Principal Problem:   Acute hypoxemic respiratory failure (HCC) Active Problems:   PNA (pneumonia)   Type 2 diabetes mellitus without complication, without long-term current use of insulin  (HCC)   Essential hypertension   Assessment and Plan:   Acute hypoxic respiratory failure acute bronchitis ?asthma-  Recurrent bronchitis  diffuse rhonchi on exam. COVID influenza RSV negative.   Quit smoking cigarettes about 35 years ago, and was not a heavy smoker.   Recurrent respiratory issues recently. Seen by pulmonology on 04/15/2024.  Notes reviewed. - Allergy  screen 11/30/2023 >  Eos 0.8 /  IgE 539 > allergy  evalindoor molds, outdoor molds, and cockroach > offered immunotherapy but declined.  Patient received treatment for GERD including Pepcid /PPI.  Also referred to ENT for pansinusitis. Echo Reviewed 04/05/2024.  Grade 1 diastolic dysfunction, normal LVEF.  No significant valvular abnormalities   -Will continue bronchodilator therapy.  Patient apparently did not feel much improved with albuterol  nebulizer.  Will add Brovana  and Pulmicort  and utilize DuoNeb as needed.   Continue IV Solu-Medrol  60 twice daily. - IV ceftriaxone , Doxy - Supportive care, O2 supplementation   Diabetes mellitus-controlled.  A1c 03/11/2024 5.9. - Hold metformin  - SSI- M - monitor glucose while on steroids   Hypertension-blood pressure systolic 130s to 839d - Resume olmesartan , triamterene -HCTZ, metoprolol      DVT prophylaxis:Lovenox  Code Status: FULL Family Communication: Spouse at bedside Disposition Plan: ~ 2 days Consults called: None  Admission status:  Inpt Tele I certify that at the point of admission it is my clinical judgment that the patient will require inpatient hospital care spanning beyond 2 midnights from the point of admission due to high intensity of service, high risk for further deterioration and high frequency of surveillance required.     Subjective:  Patient sitting on edge of the bed, not in any acute distress.  He reports ongoing shortness of breath which is worsened with ambulation.  Continues to have wheezing bilaterally.  He is frustrated about repeated flareups.  Objective: Vitals:   05/13/24 0754 05/13/24 1110 05/13/24 1345 05/13/24 1410  BP:    (!) 160/95  Pulse:    (!) 110  Resp:      Temp:    98.6 F (37 C)  TempSrc:    Oral  SpO2: 96% 93% 91% 91%  Weight:      Height:        Intake/Output Summary (Last 24 hours) at 05/13/2024 1454 Last data filed at 05/13/2024 0600 Gross per 24 hour  Intake 354.6 ml  Output --  Net 354.6 ml   Filed Weights   05/12/24 1219 05/12/24 1628  Weight: 108.9 kg 111.3 kg    Examination:  General: Alert and oriented not in any acute distress HEENT: Moist oral mucosa Chest: Diffuse wheezing bilaterally, no crackles Abdomen: Soft, nontender Extremities: No edema   Data Reviewed: I have personally reviewed following labs and imaging studies  CBC: Recent Labs  Lab 05/12/24 1231 05/13/24 0424  WBC 10.5 13.0*  NEUTROABS 6.1  --   HGB 15.8 15.4  HCT 48.0 46.7  MCV 90.2 90.0  PLT 185  200   Basic Metabolic Panel: Recent Labs  Lab 05/12/24 1231 05/13/24 0424  NA 141 140  K 3.6 4.0  CL 104 106  CO2 27 24  GLUCOSE 135* 161*  BUN 15 18  CREATININE 1.02 0.95  CALCIUM  9.1 8.7*   GFR: Estimated Creatinine Clearance: 108.3 mL/min (by C-G formula based on SCr of 0.95 mg/dL). Liver Function Tests: Recent Labs  Lab 05/12/24 1231  AST 21  ALT 21  ALKPHOS 58  BILITOT 0.4  PROT 7.8  ALBUMIN 4.5   No results for input(s): LIPASE, AMYLASE in the last 168 hours. No results for input(s): AMMONIA in the last 168 hours. Coagulation Profile: No results for input(s): INR, PROTIME in the last 168 hours. Cardiac Enzymes: No results for input(s): CKTOTAL, CKMB, CKMBINDEX, TROPONINI in the last 168 hours. BNP (last 3 results) No results for input(s): PROBNP in the last 8760 hours. HbA1C: No results for input(s): HGBA1C in the last 72 hours. CBG: Recent Labs  Lab 05/12/24 2008 05/12/24 2209 05/13/24 0729 05/13/24 1109  GLUCAP 278* 217* 167* 161*   Lipid Profile: No results for input(s): CHOL, HDL, LDLCALC, TRIG, CHOLHDL, LDLDIRECT in the last 72 hours. Thyroid Function Tests: No results for input(s): TSH, T4TOTAL, FREET4, T3FREE, THYROIDAB in the last 72 hours. Anemia Panel: No results for input(s): VITAMINB12, FOLATE, FERRITIN, TIBC, IRON, RETICCTPCT in the last 72 hours. Sepsis Labs: No results for input(s): PROCALCITON, LATICACIDVEN in the last 168 hours.  Recent Results (from the past 240 hours)  Resp panel by RT-PCR (RSV, Flu A&B, Covid) Anterior Nasal Swab     Status: None   Collection Time: 05/12/24 12:21 PM   Specimen: Anterior Nasal Swab  Result Value Ref Range Status   SARS Coronavirus 2 by RT PCR NEGATIVE NEGATIVE Final    Comment: (NOTE) SARS-CoV-2 target nucleic acids are NOT DETECTED.  The SARS-CoV-2 RNA is generally detectable in upper respiratory specimens during the acute phase of  infection. The lowest concentration of SARS-CoV-2 viral copies this assay can detect is 138 copies/mL. A negative result does not preclude SARS-Cov-2 infection and should not be used as the sole basis for treatment or other patient management decisions. A negative result may occur with  improper specimen collection/handling, submission of specimen other than nasopharyngeal swab, presence of viral mutation(s) within the areas targeted by this assay, and inadequate number of viral copies(<138 copies/mL). A negative result must be combined with clinical observations, patient history, and epidemiological information. The expected result is Negative.  Fact Sheet for Patients:  BloggerCourse.com  Fact Sheet for Healthcare Providers:  SeriousBroker.it  This test is no t yet approved or cleared by the [place]  FDA and  has been authorized for detection and/or diagnosis of SARS-CoV-2 by FDA under an Emergency Use Authorization (EUA). This EUA will remain  in effect (meaning this test can be used) for the duration of the COVID-19 declaration under Section 564(b)(1) of the Act, 21 U.S.C.section 360bbb-3(b)(1), unless the authorization is terminated  or revoked sooner.       Influenza A by PCR  NEGATIVE NEGATIVE Final   Influenza B by PCR NEGATIVE NEGATIVE Final    Comment: (NOTE) The Xpert Xpress SARS-CoV-2/FLU/RSV plus assay is intended as an aid in the diagnosis of influenza from Nasopharyngeal swab specimens and should not be used as a sole basis for treatment. Nasal washings and aspirates are unacceptable for Xpert Xpress SARS-CoV-2/FLU/RSV testing.  Fact Sheet for Patients: BloggerCourse.com  Fact Sheet for Healthcare Providers: SeriousBroker.it  This test is not yet approved or cleared by the [place]  FDA and has been authorized for detection and/or diagnosis of SARS-CoV-2  by FDA under an Emergency Use Authorization (EUA). This EUA will remain in effect (meaning this test can be used) for the duration of the COVID-19 declaration under Section 564(b)(1) of the Act, 21 U.S.C. section 360bbb-3(b)(1), unless the authorization is terminated or revoked.     Resp Syncytial Virus by PCR NEGATIVE NEGATIVE Final    Comment: (NOTE) Fact Sheet for Patients: BloggerCourse.com  Fact Sheet for Healthcare Providers: SeriousBroker.it  This test is not yet approved or cleared by the [place]  FDA and has been authorized for detection and/or diagnosis of SARS-CoV-2 by FDA under an Emergency Use Authorization (EUA). This EUA will remain in effect (meaning this test can be used) for the duration of the COVID-19 declaration under Section 564(b)(1) of the Act, 21 U.S.C. section 360bbb-3(b)(1), unless the authorization is terminated or revoked.  Performed at [facility], [address], [place], [place] [address]          Radiology Studies: CT Angio Chest PE W and/or Wo Contrast Result Date: 05/12/2024 CLINICAL DATA:  Concern for pulmonary embolism. EXAM: CT ANGIOGRAPHY CHEST WITH CONTRAST TECHNIQUE: Multidetector CT imaging of the chest was performed using the standard protocol during bolus administration of intravenous contrast. Multiplanar CT image reconstructions and MIPs were obtained to evaluate the vascular anatomy. RADIATION DOSE REDUCTION: This exam was performed according to the departmental dose-optimization program which includes automated exposure control, adjustment of the mA and/or kV according to patient size and/or use of iterative reconstruction technique. CONTRAST:  75mL OMNIPAQUE IOHEXOL 350 MG/ML SOLN COMPARISON:  CT dated 09/22/2023. FINDINGS: Evaluation of this exam is limited due to respiratory motion. Cardiovascular: There is no cardiomegaly or pericardial effusion. Mild atherosclerotic  calcification of the thoracic aorta. No aneurysmal dilatation or dissection. The origins of the great vessels of the aortic arch are patent. Evaluation of the pulmonary arteries is limited due to respiratory motion. No central pulmonary artery embolus identified. Mediastinum/Nodes: No hilar or mediastinal adenopathy. The esophagus and the thyroid gland are grossly unremarkable no mediastinal fluid collection. Lungs/Pleura: Mild bilateral lower lobe bronchiectasis. Areas of ground-glass and reticular densities at the lung bases bilaterally concerning for atypical infection or aspiration. No consolidative changes. There is no pleural effusion pneumothorax. Aspirated content or mucous secretions noted in the bilateral lower lobe distal bronchi. The central airways are patent. Upper Abdomen: No acute abnormality. Musculoskeletal: Degenerative changes of the spine. No acute osseous pathology. Review of the MIP images confirms the above findings. IMPRESSION: 1. No CT evidence of central pulmonary artery embolus. 2. Areas of ground-glass and reticular densities at the lung bases bilaterally concerning for atypical infection or aspiration. 3.  Aortic Atherosclerosis (ICD10-I70.0). Electronically Signed   By: Vanetta Chou M.D.   On: 05/12/2024 15:15   DG Chest Portable 1 View Result Date: 05/12/2024 CLINICAL DATA:  Shortness of breath. EXAM: PORTABLE CHEST 1 VIEW COMPARISON:  Chest radiograph dated 10/23/2023. FINDINGS: The heart size and mediastinal contours are  within normal limits. Both lungs are clear. The visualized skeletal structures are unremarkable. IMPRESSION: No active disease. Electronically Signed   By: Vanetta Chou M.D.   On: 05/12/2024 13:29        Scheduled Meds:  arformoterol   15 mcg Nebulization BID   aspirin EC  81 mg Oral Daily   budesonide  (PULMICORT ) nebulizer solution  0.25 mg Nebulization BID   diltiazem   120 mg Oral Daily   enoxaparin  (LOVENOX ) injection  40 mg Subcutaneous  Q24H   insulin  aspart  0-15 Units Subcutaneous TID WC   insulin  aspart  0-5 Units Subcutaneous QHS   irbesartan   300 mg Oral Daily   methylPREDNISolone  (SOLU-MEDROL ) injection  60 mg Intravenous Q12H   rosuvastatin   20 mg Oral Daily   triamterene -hydrochlorothiazide   1 tablet Oral Daily   Continuous Infusions:  cefTRIAXone  (ROCEPHIN )  IV     doxycycline  (VIBRAMYCIN ) IV 100 mg (05/13/24 0513)          Quenesha Douglass, MD [facility] 05/13/2024, 2:54 PM

## 2024-05-14 ENCOUNTER — Ambulatory Visit: Admitting: Family Medicine

## 2024-05-14 ENCOUNTER — Telehealth (INDEPENDENT_AMBULATORY_CARE_PROVIDER_SITE_OTHER): Payer: Self-pay | Admitting: Otolaryngology

## 2024-05-14 DIAGNOSIS — J9601 Acute respiratory failure with hypoxia: Secondary | ICD-10-CM | POA: Diagnosis not present

## 2024-05-14 LAB — GLUCOSE, CAPILLARY
Glucose-Capillary: 174 mg/dL — ABNORMAL HIGH (ref 70–99)
Glucose-Capillary: 189 mg/dL — ABNORMAL HIGH (ref 70–99)
Glucose-Capillary: 272 mg/dL — ABNORMAL HIGH (ref 70–99)
Glucose-Capillary: 146 mg/dL — ABNORMAL HIGH (ref 70–99)

## 2024-05-14 MED ORDER — IPRATROPIUM-ALBUTEROL 0.5-2.5 (3) MG/3ML IN SOLN
3.0000 mL | Freq: Three times a day (TID) | RESPIRATORY_TRACT | Status: DC
  Administered 2024-05-14 – 2024-05-16 (×5): 3 mL via RESPIRATORY_TRACT
  Filled 2024-05-14 (×5): qty 3

## 2024-05-14 MED ORDER — OLMESARTAN MEDOXOMIL 20 MG PO TABS
40.0000 mg | ORAL_TABLET | Freq: Every day | ORAL | Status: DC
  Administered 2024-05-15 – 2024-05-17 (×3): 40 mg via ORAL
  Filled 2024-05-14: qty 1
  Filled 2024-05-14 (×3): qty 2

## 2024-05-14 NOTE — Plan of Care (Signed)
   Problem: Education: Goal: Knowledge of General Education information will improve Description: Including pain rating scale, medication(s)/side effects and non-pharmacologic comfort measures Outcome: Progressing   Problem: Clinical Measurements: Goal: Ability to maintain clinical measurements within normal limits will improve Outcome: Progressing Goal: Diagnostic test results will improve Outcome: Progressing

## 2024-05-14 NOTE — Progress Notes (Signed)
 PROGRESS NOTE    Bruce Fisher  FMW:[id number] DOB: August 20, 1960 DOA: 05/12/2024 PCP: [name], MD   Brief Narrative:   63 year old male with medical history significant for hypertension presented to the ER with complaints of difficulty breathing, wheezing and cough productive of yellowish sputum that started about 2 weeks ago.  Patient apparently had similar symptoms in the beginning of this year during which she was hospitalized.  Sounds like he had another flareup after that  He had seen pulmonology recently.  He says he does not have COPD.  He is frustrated with frequent flareups.  He says he used to take Symbicort  scheduled but his symptoms would flare up when he stopped taking Symbicort . Patient admitted with wheezing, hypoxia with CT findings of some ground glass opacities on the basis.  Assessment & Plan:   Principal Problem:   Acute hypoxemic respiratory failure (HCC) Active Problems:   PNA (pneumonia)   Type 2 diabetes mellitus without complication, without long-term current use of insulin  (HCC)   Essential hypertension   Assessment and Plan:   Acute hypoxic respiratory failure acute bronchitis ?asthma-  Recurrent bronchitis  diffuse rhonchi on exam. COVID influenza RSV negative.   Quit smoking cigarettes about 35 years ago, and was not a heavy smoker.   Recurrent respiratory issues recently. Seen by pulmonology on 04/15/2024.  Notes reviewed. - Allergy  screen 11/30/2023 >  Eos 0.8 /  IgE 539 > allergy  evalindoor molds, outdoor molds, and cockroach > offered immunotherapy but declined.  Patient received treatment for GERD including Pepcid /PPI.  Also referred to ENT for pansinusitis. Echo Reviewed 04/05/2024.  Grade 1 diastolic dysfunction, normal LVEF.  No significant valvular abnormalities   -Will continue bronchodilator therapy.  Patient apparently did not feel much improved with albuterol  nebulizer.  Will add Brovana  and Pulmicort  and utilize DuoNeb as needed.   Continue IV Solu-Medrol  60 twice daily.  Consider decreasing steroid dose tomorrow. - IV ceftriaxone  will be continued.  Doxycycline  discontinued as patient complained of increased wheezing with doxycycline  infusion. - Supportive care, O2 supplementation   Diabetes mellitus-controlled.  A1c 03/11/2024 5.9. - Hold metformin  - SSI- M - monitor glucose while on steroids   Hypertension-blood pressure systolic 130s to 839d - Resume olmesartan , triamterene -HCTZ, metoprolol      DVT prophylaxis:Lovenox  Code Status: FULL Family Communication: Spouse at bedside Disposition Plan: ~ 2 days Consults called: None  Admission status:  Inpt Tele I certify that at the point of admission it is my clinical judgment that the patient will require inpatient hospital care spanning beyond 2 midnights from the point of admission due to high intensity of service, high risk for further deterioration and high frequency of surveillance required.     Subjective:  Patient seen and examined at the bedside.  He reports some improvement compared to yesterday.  He is not able to ambulate better than yesterday without shortness of breath.  He is still wheezy on exam.  Vital signs are stable  Objective: Vitals:   05/13/24 2127 05/14/24 0458 05/14/24 0734 05/14/24 1227  BP: (!) 154/82 (!) 169/77  (!) 158/80  Pulse: (!) 42 80  91  Resp: 19 20    Temp: 98.4 F (36.9 C) (!) 97.5 F (36.4 C)  98.7 F (37.1 C)  TempSrc: Oral Oral  Oral  SpO2: 96% 96% 96% 93%  Weight:      Height:       No intake or output data in the 24 hours ending 05/14/24 1440  Filed Weights  05/12/24 1219 05/12/24 1628  Weight: 108.9 kg 111.3 kg    Examination:  General: Alert and oriented not in any acute distress HEENT: Moist oral mucosa Chest: Diffuse wheezing bilaterally, no crackles Abdomen: Soft, nontender Extremities: No edema   Data Reviewed: I have personally reviewed following labs and imaging studies  CBC: Recent  Labs  Lab 05/12/24 1231 05/13/24 0424  WBC 10.5 13.0*  NEUTROABS 6.1  --   HGB 15.8 15.4  HCT 48.0 46.7  MCV 90.2 90.0  PLT 185 200   Basic Metabolic Panel: Recent Labs  Lab 05/12/24 1231 05/13/24 0424  NA 141 140  K 3.6 4.0  CL 104 106  CO2 27 24  GLUCOSE 135* 161*  BUN 15 18  CREATININE 1.02 0.95  CALCIUM  9.1 8.7*   GFR: Estimated Creatinine Clearance: 108.3 mL/min (by C-G formula based on SCr of 0.95 mg/dL). Liver Function Tests: Recent Labs  Lab 05/12/24 1231  AST 21  ALT 21  ALKPHOS 58  BILITOT 0.4  PROT 7.8  ALBUMIN 4.5   No results for input(s): LIPASE, AMYLASE in the last 168 hours. No results for input(s): AMMONIA in the last 168 hours. Coagulation Profile: No results for input(s): INR, PROTIME in the last 168 hours. Cardiac Enzymes: No results for input(s): CKTOTAL, CKMB, CKMBINDEX, TROPONINI in the last 168 hours. BNP (last 3 results) No results for input(s): PROBNP in the last 8760 hours. HbA1C: No results for input(s): HGBA1C in the last 72 hours. CBG: Recent Labs  Lab 05/13/24 1109 05/13/24 1620 05/13/24 2130 05/14/24 0732 05/14/24 1114  GLUCAP 161* 176* 193* 174* 189*   Lipid Profile: No results for input(s): CHOL, HDL, LDLCALC, TRIG, CHOLHDL, LDLDIRECT in the last 72 hours. Thyroid Function Tests: No results for input(s): TSH, T4TOTAL, FREET4, T3FREE, THYROIDAB in the last 72 hours. Anemia Panel: No results for input(s): VITAMINB12, FOLATE, FERRITIN, TIBC, IRON, RETICCTPCT in the last 72 hours. Sepsis Labs: No results for input(s): PROCALCITON, LATICACIDVEN in the last 168 hours.  Recent Results (from the past 240 hours)  Resp panel by RT-PCR (RSV, Flu A&B, Covid) Anterior Nasal Swab     Status: None   Collection Time: 05/12/24 12:21 PM   Specimen: Anterior Nasal Swab  Result Value Ref Range Status   SARS Coronavirus 2 by RT PCR NEGATIVE NEGATIVE Final    Comment:  (NOTE) SARS-CoV-2 target nucleic acids are NOT DETECTED.  The SARS-CoV-2 RNA is generally detectable in upper respiratory specimens during the acute phase of infection. The lowest concentration of SARS-CoV-2 viral copies this assay can detect is 138 copies/mL. A negative result does not preclude SARS-Cov-2 infection and should not be used as the sole basis for treatment or other patient management decisions. A negative result may occur with  improper specimen collection/handling, submission of specimen other than nasopharyngeal swab, presence of viral mutation(s) within the areas targeted by this assay, and inadequate number of viral copies(<138 copies/mL). A negative result must be combined with clinical observations, patient history, and epidemiological information. The expected result is Negative.  Fact Sheet for Patients:  BloggerCourse.com  Fact Sheet for Healthcare Providers:  SeriousBroker.it  This test is no t yet approved or cleared by the [place]  FDA and  has been authorized for detection and/or diagnosis of SARS-CoV-2 by FDA under an Emergency Use Authorization (EUA). This EUA will remain  in effect (meaning this test can be used) for the duration of the COVID-19 declaration under Section 564(b)(1) of the Act, 21 U.S.C.section 360bbb-3(b)(1), unless  the authorization is terminated  or revoked sooner.       Influenza A by PCR NEGATIVE NEGATIVE Final   Influenza B by PCR NEGATIVE NEGATIVE Final    Comment: (NOTE) The Xpert Xpress SARS-CoV-2/FLU/RSV plus assay is intended as an aid in the diagnosis of influenza from Nasopharyngeal swab specimens and should not be used as a sole basis for treatment. Nasal washings and aspirates are unacceptable for Xpert Xpress SARS-CoV-2/FLU/RSV testing.  Fact Sheet for Patients: BloggerCourse.com  Fact Sheet for Healthcare  Providers: SeriousBroker.it  This test is not yet approved or cleared by the [place]  FDA and has been authorized for detection and/or diagnosis of SARS-CoV-2 by FDA under an Emergency Use Authorization (EUA). This EUA will remain in effect (meaning this test can be used) for the duration of the COVID-19 declaration under Section 564(b)(1) of the Act, 21 U.S.C. section 360bbb-3(b)(1), unless the authorization is terminated or revoked.     Resp Syncytial Virus by PCR NEGATIVE NEGATIVE Final    Comment: (NOTE) Fact Sheet for Patients: BloggerCourse.com  Fact Sheet for Healthcare Providers: SeriousBroker.it  This test is not yet approved or cleared by the [place]  FDA and has been authorized for detection and/or diagnosis of SARS-CoV-2 by FDA under an Emergency Use Authorization (EUA). This EUA will remain in effect (meaning this test can be used) for the duration of the COVID-19 declaration under Section 564(b)(1) of the Act, 21 U.S.C. section 360bbb-3(b)(1), unless the authorization is terminated or revoked.  Performed at [facility], [address], [place], [place] [address]          Radiology Studies: CT Angio Chest PE W and/or Wo Contrast Result Date: 05/12/2024 CLINICAL DATA:  Concern for pulmonary embolism. EXAM: CT ANGIOGRAPHY CHEST WITH CONTRAST TECHNIQUE: Multidetector CT imaging of the chest was performed using the standard protocol during bolus administration of intravenous contrast. Multiplanar CT image reconstructions and MIPs were obtained to evaluate the vascular anatomy. RADIATION DOSE REDUCTION: This exam was performed according to the departmental dose-optimization program which includes automated exposure control, adjustment of the mA and/or kV according to patient size and/or use of iterative reconstruction technique. CONTRAST:  75mL OMNIPAQUE IOHEXOL 350 MG/ML SOLN  COMPARISON:  CT dated 09/22/2023. FINDINGS: Evaluation of this exam is limited due to respiratory motion. Cardiovascular: There is no cardiomegaly or pericardial effusion. Mild atherosclerotic calcification of the thoracic aorta. No aneurysmal dilatation or dissection. The origins of the great vessels of the aortic arch are patent. Evaluation of the pulmonary arteries is limited due to respiratory motion. No central pulmonary artery embolus identified. Mediastinum/Nodes: No hilar or mediastinal adenopathy. The esophagus and the thyroid gland are grossly unremarkable no mediastinal fluid collection. Lungs/Pleura: Mild bilateral lower lobe bronchiectasis. Areas of ground-glass and reticular densities at the lung bases bilaterally concerning for atypical infection or aspiration. No consolidative changes. There is no pleural effusion pneumothorax. Aspirated content or mucous secretions noted in the bilateral lower lobe distal bronchi. The central airways are patent. Upper Abdomen: No acute abnormality. Musculoskeletal: Degenerative changes of the spine. No acute osseous pathology. Review of the MIP images confirms the above findings. IMPRESSION: 1. No CT evidence of central pulmonary artery embolus. 2. Areas of ground-glass and reticular densities at the lung bases bilaterally concerning for atypical infection or aspiration. 3.  Aortic Atherosclerosis (ICD10-I70.0). Electronically Signed   By: Vanetta Chou M.D.   On: 05/12/2024 15:15        Scheduled Meds:  arformoterol   15 mcg Nebulization BID  aspirin EC  81 mg Oral Daily   budesonide  (PULMICORT ) nebulizer solution  0.25 mg Nebulization BID   diltiazem   120 mg Oral Daily   enoxaparin  (LOVENOX ) injection  40 mg Subcutaneous Q24H   insulin  aspart  0-15 Units Subcutaneous TID WC   insulin  aspart  0-5 Units Subcutaneous QHS   methylPREDNISolone  (SOLU-MEDROL ) injection  60 mg Intravenous Q12H   [START ON 05/15/2024] olmesartan   40 mg Oral Daily    rosuvastatin   20 mg Oral Daily   triamterene -hydrochlorothiazide   1 tablet Oral Daily   Continuous Infusions:  cefTRIAXone  (ROCEPHIN )  IV 2 g (05/14/24 1413)          Derryl Duval, MD [facility] 05/14/2024, 2:40 PM

## 2024-05-14 NOTE — Telephone Encounter (Signed)
 Lvm to rs due to provider

## 2024-05-15 DIAGNOSIS — I10 Essential (primary) hypertension: Secondary | ICD-10-CM | POA: Diagnosis not present

## 2024-05-15 DIAGNOSIS — J9601 Acute respiratory failure with hypoxia: Secondary | ICD-10-CM | POA: Diagnosis not present

## 2024-05-15 DIAGNOSIS — J189 Pneumonia, unspecified organism: Secondary | ICD-10-CM | POA: Diagnosis not present

## 2024-05-15 DIAGNOSIS — E119 Type 2 diabetes mellitus without complications: Secondary | ICD-10-CM | POA: Diagnosis not present

## 2024-05-15 LAB — RESPIRATORY PANEL BY PCR

## 2024-05-15 LAB — SEDIMENTATION RATE: Sed Rate: 1 mm/h (ref 0–20)

## 2024-05-15 LAB — PRO BRAIN NATRIURETIC PEPTIDE: Pro Brain Natriuretic Peptide: 742 pg/mL — ABNORMAL HIGH (ref <300.0)

## 2024-05-15 LAB — GLUCOSE, CAPILLARY
Glucose-Capillary: 221 mg/dL — ABNORMAL HIGH (ref 70–99)
Glucose-Capillary: 273 mg/dL — ABNORMAL HIGH (ref 70–99)
Glucose-Capillary: 389 mg/dL — ABNORMAL HIGH (ref 70–99)
Glucose-Capillary: 233 mg/dL — ABNORMAL HIGH (ref 70–99)

## 2024-05-15 MED ORDER — ADULT MULTIVITAMIN W/MINERALS CH
1.0000 | ORAL_TABLET | Freq: Every day | ORAL | Status: DC
  Administered 2024-05-15 – 2024-05-17 (×3): 1 via ORAL
  Filled 2024-05-15 (×3): qty 1

## 2024-05-15 MED ORDER — TRIAMCINOLONE ACETONIDE 55 MCG/ACT NA AERO: 2.0000 | INHALATION_SPRAY | Freq: Every day | NASAL | Status: DC

## 2024-05-15 MED ORDER — FUROSEMIDE 10 MG/ML IJ SOLN
40.0000 mg | Freq: Every day | INTRAMUSCULAR | Status: DC
  Administered 2024-05-15 – 2024-05-16 (×2): 40 mg via INTRAVENOUS
  Filled 2024-05-15 (×2): qty 4

## 2024-05-15 MED ORDER — LORATADINE 10 MG PO TABS
10.0000 mg | ORAL_TABLET | Freq: Every day | ORAL | Status: DC
  Administered 2024-05-15 – 2024-05-17 (×3): 10 mg via ORAL
  Filled 2024-05-15 (×3): qty 1

## 2024-05-15 MED ORDER — VITAMIN D 25 MCG (1000 UNIT) PO TABS
1000.0000 [IU] | ORAL_TABLET | Freq: Every day | ORAL | Status: DC
Start: 2024-05-16 — End: 2024-05-17
  Administered 2024-05-16 – 2024-05-17 (×2): 1000 [IU] via ORAL
  Filled 2024-05-15 (×2): qty 1

## 2024-05-15 MED ORDER — FLUTICASONE PROPIONATE 50 MCG/ACT NA SUSP
2.0000 | Freq: Every day | NASAL | Status: DC
  Administered 2024-05-15 – 2024-05-16 (×2): 2 via NASAL
  Filled 2024-05-15: qty 16

## 2024-05-15 MED ORDER — DILTIAZEM HCL 25 MG/5ML IV SOLN
10.0000 mg | Freq: Once | INTRAVENOUS | Status: AC
  Administered 2024-05-15: 10 mg via INTRAVENOUS
  Filled 2024-05-15: qty 5

## 2024-05-15 NOTE — Progress Notes (Signed)
 Provider to bedside to evaluate patient who remains in svt in highh 150's. New orders for 10mg  ivp of cardizem  received and given. Patient converts back to ventricular bigeminy at rate of 88. Will continue to monitor and hold breathing treatments until morning.

## 2024-05-15 NOTE — Progress Notes (Signed)
 [id number] Patient eating breakfast, declined nebulizer treatment at this time.

## 2024-05-15 NOTE — Progress Notes (Signed)
 While rounding on patient he was found to be in new onset SVT in high 140's while sound asleep. Patient woken up asked if he had cp or sob which he denied. EKG and fresh VS obtained and note sent to on call MD. CN notified pending orders

## 2024-05-15 NOTE — Plan of Care (Signed)
   Problem: Education: Goal: Knowledge of General Education information will improve Description: Including pain rating scale, medication(s)/side effects and non-pharmacologic comfort measures Outcome: Progressing   Problem: Clinical Measurements: Goal: Ability to maintain clinical measurements within normal limits will improve Outcome: Progressing Goal: Diagnostic test results will improve Outcome: Progressing

## 2024-05-15 NOTE — Progress Notes (Signed)
 Progress Note   Patient: Bruce Fisher FMW:[id number] DOB: Aug 06, 1960 DOA: 05/12/2024     3 DOS: the patient was seen and examined on 05/15/2024   Brief hospital course: Bruce Fisher is a 63 year old male with medical history significant for hypertension presented to the ER with complaints of difficulty breathing, wheezing and cough productive of yellowish sputum that started about 2 weeks ago.  Patient apparently had similar symptoms in the beginning of this year during which she was hospitalized.  Sounds like he had another flareup after that  He had seen pulmonology recently.  He says he does not have COPD.  He is frustrated with frequent flareups.  He says he used to take Symbicort  scheduled but his symptoms would flare up when he stopped taking Symbicort . Patient admitted with wheezing, hypoxia with CT findings of some ground glass opacities on the bases.  Assessment and Plan: Acute hypoxic respiratory failure Acute bronchitis ?asthma-  Recurrent bronchitis Ground glass opacities on CT chest. diffuse rhonchi on exam. COVID influenza RSV negative.  Will get 20 pathogen viral panel test. Patient quit smoking cigarettes about 35 years ago, and was not a heavy smoker.   Recurrent respiratory issues recently. Seen by pulmonology on 04/15/2024.  Notes reviewed. Allergy  screen 11/30/2023 >  Eos 0.8 /  IgE 539 > allergy  evalindoor molds, outdoor molds, and cockroach > offered immunotherapy but declined.  Patient received treatment for GERD including Pepcid /PPI.  Also referred to ENT for pansinusitis. Echo Reviewed 04/05/2024.  Grade 1 diastolic dysfunction, normal LVEF.  No significant valvular abnormalities. I discussed with Dr. Darlean his primary pulmonologist who advised ESR, BNP.  If he does not improve clinically will contact PCCM for further management. Continue DuoNebs, IV steroids, Rocephin  therapy. Continue to wean supplemental oxygen .    Diabetes mellitus-controlled.  A1c 03/11/2024  5.9. Continue to hold metformin . Continue Accu-Cheks, sliding scale insulin . Monitor for hyperglycemia while on steroids.   Hypertension-blood pressure systolic 130s to 839d Resumed olmesartan , triamterene -HCTZ, Benicar . Caution with bradycardia.  Obesity class I- BMI 31.49- Diet, exercise and weight reduction advised.     Out of bed to chair. Incentive spirometry. Nursing supportive care. Fall, aspiration precautions. Diet:  Diet Orders (From admission, onward)     Start     Ordered   05/12/24 1641  Diet Heart Fluid consistency: Thin  Diet effective now       Question:  Fluid consistency:  Answer:  Thin   05/12/24 1640           DVT prophylaxis: enoxaparin  (LOVENOX ) injection 40 mg Start: 05/12/24 1830  Level of care: Telemetry   Code Status: Full Code  Subjective: Patient is seen and examined today morning.  He is sitting in the chair, currently on 3 to 5 L supplemental oxygen .  Patient has been having cough which is dry.  Able to get out of bed, walk with physical therapy.  Eating fair.  Physical Exam: Vitals:   05/15/24 0738 05/15/24 0918 05/15/24 0947 05/15/24 1415  BP:   133/72   Pulse: 62     Resp:   20   Temp:      TempSrc:      SpO2:  93%  97%  Weight:      Height:        General - Middle aged African-American male, mild respiratory distress HEENT - PERRLA, EOMI, atraumatic head, non tender sinuses. Lung - Clear, basal rales, diffuse rhonchi, dry cough. Heart - S1, S2 heard, no murmurs, rubs, trace pedal  edema. Abdomen - Soft, non tender, obese, bowel sounds good Neuro - Alert, awake and oriented x 3, non focal exam. Skin - Warm and dry.  Data Reviewed:      Latest Ref Rng & Units 05/13/2024    4:24 AM 05/12/2024   12:31 PM 03/11/2024    9:07 AM  CBC  WBC 4.0 - 10.5 K/uL 13.0  10.5  8.0   Hemoglobin 13.0 - 17.0 g/dL 84.5  84.1  84.9   Hematocrit 39.0 - 52.0 % 46.7  48.0  46.4   Platelets 150 - 400 K/uL 200  185  171       Latest Ref  Rng & Units 05/13/2024    4:24 AM 05/12/2024   12:31 PM 03/11/2024    9:07 AM  BMP  Glucose 70 - 99 mg/dL 838  864  894   BUN 8 - 23 mg/dL 18  15  13    Creatinine 0.61 - 1.24 mg/dL 9.04  8.97  8.98   BUN/Creat Ratio 10 - 24   13   Sodium 135 - 145 mmol/L 140  141  142   Potassium 3.5 - 5.1 mmol/L 4.0  3.6  4.1   Chloride 98 - 111 mmol/L 106  104  105   CO2 22 - 32 mmol/L 24  27  22    Calcium  8.9 - 10.3 mg/dL 8.7  9.1  8.9    No results found.  Family Communication: Discussed with patient, understand and agree. All questions answered.  Disposition: Status is: Inpatient Remains inpatient appropriate because: IV steroids, IV antibiotics, DuoNebs, oxygen   Planned Discharge Destination: Home with Home Health     Time spent: 45 minutes  Author: Concepcion Riser, MD 05/15/2024 4:21 PM Secure chat 7am to 7pm For on call review [email or web address].

## 2024-05-15 NOTE — Progress Notes (Signed)
   05/15/24 0235  Vitals  BP (!) 131/98  MAP (mmHg) 116  BP Location Left Arm  BP Method Automatic  Patient Position (if appropriate) Sitting  Pulse Rate (!) 144  Pulse Rate Source Dinamap  Level of Consciousness  Level of Consciousness Alert  MEWS COLOR  MEWS Score Color Yellow  Oxygen  Therapy  SpO2 95 %  O2 Device Nasal Cannula  O2 Flow Rate (L/min) 3.5 L/min  MEWS Score  MEWS Temp 0  MEWS Systolic 0  MEWS Pulse 3  MEWS RR 0  MEWS LOC 0  MEWS Score 3   Pre cardizem  push vitals

## 2024-05-15 NOTE — Plan of Care (Signed)

## 2024-05-15 NOTE — Progress Notes (Signed)
   05/15/24 0040  Vitals  Temp 98.3 F (36.8 C)  Temp Source Oral  BP (!) 125/96  MAP (mmHg) 106  BP Location Right Arm  BP Method Automatic  Patient Position (if appropriate) Lying  Pulse Rate (!) 142  Pulse Rate Source Dinamap  Level of Consciousness  Level of Consciousness Alert  Oxygen  Therapy  SpO2 96 %  O2 Device Room Air   Pt in new onst SVT at 145 theses are current vs pt was previously in bigeminy at 85 hr

## 2024-05-15 NOTE — Progress Notes (Signed)
   05/15/24 0238  Vitals  BP (!) 140/73  MAP (mmHg) 92  BP Location Left Arm  BP Method Automatic  Patient Position (if appropriate) Sitting  Pulse Rate (!) 43  Pulse Rate Source Dinamap  Level of Consciousness  Level of Consciousness Alert  MEWS COLOR  MEWS Score Color Green  Oxygen  Therapy  SpO2 95 %  O2 Device Nasal Cannula  O2 Flow Rate (L/min) 3.5 L/min  MEWS Score  MEWS Temp 0  MEWS Systolic 0  MEWS Pulse 1  MEWS RR 0  MEWS LOC 0  MEWS Score 1   Post cardizem  vitals

## 2024-05-16 DIAGNOSIS — I10 Essential (primary) hypertension: Secondary | ICD-10-CM | POA: Diagnosis not present

## 2024-05-16 DIAGNOSIS — E66811 Obesity, class 1: Secondary | ICD-10-CM | POA: Diagnosis not present

## 2024-05-16 DIAGNOSIS — Z7951 Long term (current) use of inhaled steroids: Secondary | ICD-10-CM | POA: Diagnosis not present

## 2024-05-16 DIAGNOSIS — E785 Hyperlipidemia, unspecified: Secondary | ICD-10-CM | POA: Diagnosis not present

## 2024-05-16 DIAGNOSIS — Z1152 Encounter for screening for COVID-19: Secondary | ICD-10-CM | POA: Diagnosis not present

## 2024-05-16 DIAGNOSIS — J324 Chronic pansinusitis: Secondary | ICD-10-CM | POA: Diagnosis not present

## 2024-05-16 DIAGNOSIS — Z7984 Long term (current) use of oral hypoglycemic drugs: Secondary | ICD-10-CM | POA: Diagnosis not present

## 2024-05-16 DIAGNOSIS — J9601 Acute respiratory failure with hypoxia: Secondary | ICD-10-CM | POA: Diagnosis not present

## 2024-05-16 DIAGNOSIS — K219 Gastro-esophageal reflux disease without esophagitis: Secondary | ICD-10-CM | POA: Diagnosis not present

## 2024-05-16 DIAGNOSIS — Z7982 Long term (current) use of aspirin: Secondary | ICD-10-CM | POA: Diagnosis not present

## 2024-05-16 DIAGNOSIS — E119 Type 2 diabetes mellitus without complications: Secondary | ICD-10-CM | POA: Diagnosis not present

## 2024-05-16 DIAGNOSIS — J189 Pneumonia, unspecified organism: Secondary | ICD-10-CM | POA: Diagnosis not present

## 2024-05-16 LAB — GLUCOSE, CAPILLARY
Glucose-Capillary: 254 mg/dL — ABNORMAL HIGH (ref 70–99)
Glucose-Capillary: 281 mg/dL — ABNORMAL HIGH (ref 70–99)
Glucose-Capillary: 328 mg/dL — ABNORMAL HIGH (ref 70–99)
Glucose-Capillary: 352 mg/dL — ABNORMAL HIGH (ref 70–99)

## 2024-05-16 MED ORDER — DILTIAZEM HCL 25 MG/5ML IV SOLN: 15.0000 mg | Freq: Once | INTRAVENOUS | Status: DC

## 2024-05-16 MED ORDER — PANTOPRAZOLE SODIUM 40 MG PO TBEC
40.0000 mg | DELAYED_RELEASE_TABLET | Freq: Every day | ORAL | Status: DC
  Administered 2024-05-16 – 2024-05-17 (×2): 40 mg via ORAL
  Filled 2024-05-16 (×2): qty 1

## 2024-05-16 MED ORDER — INSULIN GLARGINE-YFGN 100 UNIT/ML ~~LOC~~ SOLN
10.0000 [IU] | Freq: Every day | SUBCUTANEOUS | Status: DC
  Administered 2024-05-16 – 2024-05-17 (×2): 10 [IU] via SUBCUTANEOUS
  Filled 2024-05-16 (×3): qty 0.1

## 2024-05-16 MED ORDER — MONTELUKAST SODIUM 10 MG PO TABS
10.0000 mg | ORAL_TABLET | Freq: Every day | ORAL | Status: DC
  Administered 2024-05-16: 10 mg via ORAL
  Filled 2024-05-16: qty 1

## 2024-05-16 MED ORDER — DILTIAZEM HCL 25 MG/5ML IV SOLN
10.0000 mg | Freq: Once | INTRAVENOUS | Status: AC
  Administered 2024-05-16: 10 mg via INTRAVENOUS
  Filled 2024-05-16: qty 5

## 2024-05-16 MED ORDER — FUROSEMIDE 10 MG/ML IJ SOLN
40.0000 mg | Freq: Two times a day (BID) | INTRAMUSCULAR | Status: AC
  Administered 2024-05-16 – 2024-05-17 (×2): 40 mg via INTRAVENOUS
  Filled 2024-05-16 (×2): qty 4

## 2024-05-16 NOTE — Progress Notes (Cosign Needed)
 Patient ambulated on 2L of 02, sat maintained 91% - 93%, 89%-91% RA while ambulating. Patient tolerated well no complaints of SOB.

## 2024-05-16 NOTE — Inpatient Diabetes Management (Signed)
 Inpatient Diabetes Program Recommendations  AACE/ADA: New Consensus Statement on Inpatient Glycemic Control   Target Ranges:  Prepandial:   less than 140 mg/dL      Peak postprandial:   less than 180 mg/dL (1-2 hours)      Critically ill patients:  140 - 180 mg/dL    Latest Reference Range & Units 05/15/24 07:48 05/15/24 11:35 05/15/24 16:55 05/15/24 22:09 05/16/24 07:11  Glucose-Capillary 70 - 99 mg/dL 778 (H) 726 (H) 610 (H) 233 (H) 254 (H)   Review of Glycemic Control  Diabetes history: DM2 Outpatient Diabetes medications: Metformin  500 mg BID Current orders for Inpatient glycemic control: Semglee  10 units daily, Novolog  0-15 units TID with meals, Novolog  0-5 units at bedtime; Solumedrol 60 mg Q12H  Inpatient Diabetes Program Recommendations:    Insulin : Noted Semglee  10 units daily ordered today. If steroids are continued, please consider ordering Novolog  4 units TID with meals for meal coverage if patient eats at least 50% of meals.  Thanks, Earnie Gainer, RN, MSN, CDCES Diabetes Coordinator Inpatient Diabetes Program [phone] (Team Pager from 8am to 5pm)

## 2024-05-16 NOTE — Progress Notes (Addendum)
   05/16/24 1828 05/16/24 1830 05/16/24 1831  Oxygen  Therapy  SpO2 95 % 92 % 91 %  O2 Device Room Air Room Air Room Air  Patient Activity (if Appropriate) Other (Comment) (standing prior to ambulating) Ambulating Ambulating    05/16/24 1832 05/16/24 1834  Oxygen  Therapy  SpO2 93 % 94 %  O2 Device Room Air Room Air  Patient Activity (if Appropriate) Ambulating In bed (sitting on side of bed after ambulating)     Patient ambulated 2 laps around nursing station and back to room. Brisk pace with no shortness of breath observed or reported. Patient tolerated well. Patient now on room air with O2 sat 94%

## 2024-05-16 NOTE — Progress Notes (Signed)
   05/16/24 0042  Vitals  BP (!) 118/90  MAP (mmHg) 100  BP Location Right Arm  BP Method Automatic  Patient Position (if appropriate) Lying  Pulse Rate (!) 140  Pulse Rate Source Dinamap  Level of Consciousness  Level of Consciousness Alert  Oxygen  Therapy  SpO2 96 %  O2 Device Nasal Cannula  O2 Flow Rate (L/min) 3.5 L/min   Post Dilt push vs

## 2024-05-16 NOTE — Progress Notes (Signed)
   05/16/24 0015  Vitals  Temp 98.3 F (36.8 C)  Temp Source Oral  BP (!) 122/92  MAP (mmHg) 103  BP Location Right Arm  BP Method Automatic  Patient Position (if appropriate) Lying  Pulse Rate (!) 146  Pulse Rate Source Dinamap  Level of Consciousness  Level of Consciousness Alert  Oxygen  Therapy  SpO2 97 %  O2 Device Nasal Cannula  O2 Flow Rate (L/min) 3.5 L/min   Patient in svt provider notified EKG completed and vs taken new orders for cardizem  push pending pharmacy approval

## 2024-05-16 NOTE — Progress Notes (Signed)
   05/16/24 0047  Vitals  BP (!) 118/90  MAP (mmHg) 100  BP Location Right Arm  BP Method Automatic  Patient Position (if appropriate) Lying  Pulse Rate 84  Pulse Rate Source Dinamap  Level of Consciousness  Level of Consciousness Alert  Oxygen  Therapy  SpO2 97 %  O2 Device Nasal Cannula  O2 Flow Rate (L/min) 3.5 L/min   Post conversion vs

## 2024-05-16 NOTE — Progress Notes (Signed)
 Patient once found asleep again in SVT in high 140's RKG obtained new VS recorded provider notified new orders received for 10mg  ivp diltiazem  as it worked last night when this occurred.

## 2024-05-16 NOTE — Progress Notes (Signed)
 Progress Note   Patient: Bruce Fisher FMW:[id number] DOB: [birth year] DOA: 05/12/2024     4 DOS: the patient was seen and examined on 05/16/2024   Subjective:  The patient was seen and examined this morning, stable still complain shortness of breath, cough, 3.5 L of oxygen  ..  Satting 96% - No issues overnight    Brief hospital course: Bruce Fisher is a 63 year old male with medical history significant for hypertension presented to the ER with complaints of difficulty breathing, wheezing and cough productive of yellowish sputum that started about 2 weeks ago.  Patient apparently had similar symptoms in the beginning of this year during which she was hospitalized.  Sounds like he had another flareup after that  He had seen pulmonology recently.  He says he does not have COPD.  He is frustrated with frequent flareups.  He says he used to take Symbicort  scheduled but his symptoms would flare up when he stopped taking Symbicort . Patient admitted with wheezing, hypoxia with CT findings of some ground glass opacities on the bases.   Assessment and Plan:   Acute hypoxic respiratory failure- Acute bronchitis ?asthma- - Recurrent bronchitis Ground glass opacities on CT chest. - COVID influenza RSV negative.  -  20 pathogen viral panel test all negative Patient quit smoking cigarettes about 35 years ago, and was not a heavy smoker.    Recurrent respiratory issues recently. Seen by pulmonology on 04/15/2024.   Allergy  screen 11/30/2023 >  Eos 0.8 /  IgE 539 > allergy  evalindoor molds, outdoor molds, and cockroach > offered immunotherapy but declined.   Patient received treatment for GERD including Pepcid /PPI.  Also referred to ENT for pansinusitis.  Echo Reviewed 04/05/2024.  Grade 1 diastolic dysfunction, normal LVEF.  No significant valvular abnormalities.  The case was  Dr. Darlean his primary pulmonologist who advised ESR, BNP.  If he does not improve clinically to contact PCCM for further  management--- he is improving BNP- 742.0 >>  ESR - 1   Continue DuoNebs, IV steroids, Rocephin  therapy. Continue to wean supplemental oxygen --- currently on 3.5 L, weaning off supplemental oxygen  Not O2 dependent at baseline   Diabetes mellitus-controlled.  A1c 03/11/2024 5.9. Continue to hold metformin . Continue Accu-Cheks, sliding scale insulin , adding long-acting insulin  Monitor for hyperglycemia while on steroids.   Hypertension-blood pressure systolic 130s to 839d Resumed olmesartan , triamterene -HCTZ, Benicar . Caution with bradycardia.  Obesity class I- BMI 31.49- Diet, exercise and weight reduction advised.     Out of bed to chair. Incentive spirometry. Nursing supportive care. Fall, aspiration precautions. Diet:  Diet Orders (From admission, onward)     Start     Ordered   05/12/24 1641  Diet Heart Fluid consistency: Thin  Diet effective now       Question:  Fluid consistency:  Answer:  Thin   05/12/24 1640           DVT prophylaxis: enoxaparin  (LOVENOX ) injection 40 mg Start: 05/12/24 1830  Level of care: Telemetry   Code Status: Full Code    Physical Exam: Vitals:   05/16/24 0824 05/16/24 0826 05/16/24 1000 05/16/24 1010  BP:      Pulse:      Resp:      Temp:      TempSrc:      SpO2: 94% 94% 92% 94%  Weight:      Height:        General:  AAO x 3,  cooperative, no distress;   HEENT:  Normocephalic, PERRL, otherwise with  in Normal limits   Neuro:  CNII-XII intact. , normal motor and sensation, reflexes intact   Lungs:   Clear to auscultation BL, Respirations unlabored,  Upper respiratory rhonchi, No wheezes / crackles  Cardio:    S1/S2, RRR, No murmure, No Rubs or Gallops   Abdomen:  Soft, non-tender, bowel sounds active all four quadrants, no guarding or peritoneal signs.  Muscular  skeletal:  Limited exam -global generalized weaknesses - in bed, able to move all 4 extremities,   2+ pulses,  symmetric, No pitting edema  Skin:  Dry, warm  to touch, negative for any Rashes,  Wounds: Please see nursing documentation          Data Reviewed:      Latest Ref Rng & Units 05/13/2024    4:24 AM 05/12/2024   12:31 PM 03/11/2024    9:07 AM  CBC  WBC 4.0 - 10.5 K/uL 13.0  10.5  8.0   Hemoglobin 13.0 - 17.0 g/dL 84.5  84.1  84.9   Hematocrit 39.0 - 52.0 % 46.7  48.0  46.4   Platelets 150 - 400 K/uL 200  185  171       Latest Ref Rng & Units 05/13/2024    4:24 AM 05/12/2024   12:31 PM 03/11/2024    9:07 AM  BMP  Glucose 70 - 99 mg/dL 838  864  894   BUN 8 - 23 mg/dL 18  15  13    Creatinine 0.61 - 1.24 mg/dL 9.04  8.97  8.98   BUN/Creat Ratio 10 - 24   13   Sodium 135 - 145 mmol/L 140  141  142   Potassium 3.5 - 5.1 mmol/L 4.0  3.6  4.1   Chloride 98 - 111 mmol/L 106  104  105   CO2 22 - 32 mmol/L 24  27  22    Calcium  8.9 - 10.3 mg/dL 8.7  9.1  8.9    No results found.  Family Communication: Discussed with patient, understand and agree. All questions answered.  Disposition: Status is: Inpatient Remains inpatient appropriate because: IV steroids, IV antibiotics, DuoNebs, oxygen   Planned Discharge Destination: Home with Home Health     Time spent: 45 minutes  Author: Adriana DELENA Grams, MD 05/16/2024 10:27 AM Secure chat 7am to 7pm For on call review [email or web address].

## 2024-05-16 NOTE — Plan of Care (Signed)

## 2024-05-17 DIAGNOSIS — J9601 Acute respiratory failure with hypoxia: Secondary | ICD-10-CM | POA: Diagnosis not present

## 2024-05-17 LAB — CBC WITH DIFFERENTIAL/PLATELET
Abs Immature Granulocytes: 0.09 K/uL — ABNORMAL HIGH (ref 0.00–0.07)
Basophils Absolute: 0 K/uL (ref 0.0–0.1)
Basophils Relative: 0 %
Eosinophils Absolute: 0 K/uL (ref 0.0–0.5)
Eosinophils Relative: 0 %
HCT: 47.2 % (ref 39.0–52.0)
Hemoglobin: 16 g/dL (ref 13.0–17.0)
Immature Granulocytes: 1 %
Lymphocytes Relative: 8 %
Lymphs Abs: 1.2 K/uL (ref 0.7–4.0)
MCH: 29.7 pg (ref 26.0–34.0)
MCHC: 33.9 g/dL (ref 30.0–36.0)
MCV: 87.7 fL (ref 80.0–100.0)
Monocytes Absolute: 0.8 K/uL (ref 0.1–1.0)
Monocytes Relative: 5 %
Neutro Abs: 13.8 K/uL — ABNORMAL HIGH (ref 1.7–7.7)
Neutrophils Relative %: 86 %
Platelets: 196 K/uL (ref 150–400)
RBC: 5.38 MIL/uL (ref 4.22–5.81)
RDW: 14 % (ref 11.5–15.5)
WBC: 15.9 K/uL — ABNORMAL HIGH (ref 4.0–10.5)
nRBC: 0 % (ref 0.0–0.2)

## 2024-05-17 LAB — COMPREHENSIVE METABOLIC PANEL WITH GFR
ALT: 26 U/L (ref 0–44)
AST: 15 U/L (ref 15–41)
Albumin: 4.1 g/dL (ref 3.5–5.0)
Alkaline Phosphatase: 64 U/L (ref 38–126)
Anion gap: 10 (ref 5–15)
BUN: 32 mg/dL — ABNORMAL HIGH (ref 8–23)
CO2: 29 mmol/L (ref 22–32)
Calcium: 8.6 mg/dL — ABNORMAL LOW (ref 8.9–10.3)
Chloride: 99 mmol/L (ref 98–111)
Creatinine, Ser: 1.25 mg/dL — ABNORMAL HIGH (ref 0.61–1.24)
GFR, Estimated: >60 mL/min (ref >60)
Glucose, Bld: 260 mg/dL — ABNORMAL HIGH (ref 70–99)
Potassium: 3.9 mmol/L (ref 3.5–5.1)
Sodium: 138 mmol/L (ref 135–145)
Total Bilirubin: 0.3 mg/dL (ref 0.0–1.2)
Total Protein: 6.8 g/dL (ref 6.5–8.1)

## 2024-05-17 LAB — PRO BRAIN NATRIURETIC PEPTIDE: Pro Brain Natriuretic Peptide: 150 pg/mL (ref <300.0)

## 2024-05-17 LAB — GLUCOSE, CAPILLARY
Glucose-Capillary: 241 mg/dL — ABNORMAL HIGH (ref 70–99)
Glucose-Capillary: 315 mg/dL — ABNORMAL HIGH (ref 70–99)

## 2024-05-17 MED ORDER — METHYLPREDNISOLONE 4 MG PO TBPK: ORAL_TABLET | ORAL | 0 refills | Status: DC

## 2024-05-17 MED ORDER — BUDESONIDE-FORMOTEROL FUMARATE 160-4.5 MCG/ACT IN AERO: 2.0000 | INHALATION_SPRAY | Freq: Two times a day (BID) | RESPIRATORY_TRACT | 12 refills | Status: AC

## 2024-05-17 MED ORDER — INSULIN GLARGINE-YFGN 100 UNIT/ML ~~LOC~~ SOPN: 10.0000 [IU] | PEN_INJECTOR | Freq: Every day | SUBCUTANEOUS | 0 refills | Status: DC

## 2024-05-17 MED ORDER — INSULIN GLARGINE-YFGN 100 UNIT/ML ~~LOC~~ SOLN: 10.0000 [IU] | Freq: Every day | SUBCUTANEOUS | 3 refills | Status: DC

## 2024-05-17 MED ORDER — AZITHROMYCIN 500 MG PO TABS: 500.0000 mg | ORAL_TABLET | Freq: Every day | ORAL | 0 refills | Status: AC

## 2024-05-17 MED ORDER — ALBUTEROL SULFATE HFA 108 (90 BASE) MCG/ACT IN AERS: 2.0000 | INHALATION_SPRAY | RESPIRATORY_TRACT | 3 refills | Status: AC | PRN

## 2024-05-17 MED ORDER — FUROSEMIDE 20 MG PO TABS
20.0000 mg | ORAL_TABLET | Freq: Every day | ORAL | 1 refills | Status: AC
Start: 2024-05-17 — End: 2024-06-11

## 2024-05-17 NOTE — Inpatient Diabetes Management (Signed)
 Inpatient Diabetes Program Recommendations  AACE/ADA: New Consensus Statement on Inpatient Glycemic Control   Target Ranges:  Prepandial:   less than 140 mg/dL      Peak postprandial:   less than 180 mg/dL (1-2 hours)      Critically ill patients:  140 - 180 mg/dL    Latest Reference Range & Units 05/17/24 04:23  Glucose 70 - 99 mg/dL 739 (H)    Latest Reference Range & Units 05/16/24 07:11 05/16/24 12:01 05/16/24 16:16 05/16/24 23:37  Glucose-Capillary 70 - 99 mg/dL 745 (H) 671 (H) 647 (H) 281 (H)   Review of Glycemic Control  Diabetes history: DM2 Outpatient Diabetes medications: Metformin  500 mg BID Current orders for Inpatient glycemic control: Semglee  10 units daily, Novolog  0-15 units TID with meals, Novolog  0-5 units at bedtime; Solumedrol 60 mg Q12H   Inpatient Diabetes Program Recommendations:     Insulin :  If steroids are continued as ordered, please consider increasing Semglee  to 13 units daily and ordering Novolog  5 units TID with meals for meal coverage if patient eats at least 50% of meals.   Thanks, Earnie Gainer, RN, MSN, CDCES Diabetes Coordinator Inpatient Diabetes Program [phone] (Team Pager from 8am to 5pm)

## 2024-05-17 NOTE — Plan of Care (Signed)
 Reviewed Plan of care. Pt has been progressing. Adequate for discharge home at am.  Problem: Clinical Measurements: Goal: Ability to maintain clinical measurements within normal limits will improve Outcome: Progressing Goal: Will remain free from infection Outcome: Progressing Goal: Diagnostic test results will improve Outcome: Progressing Goal: Respiratory complications will improve Outcome: Progressing Goal: Cardiovascular complication will be avoided Outcome: Progressing   Problem: Health Behavior/Discharge Planning: Goal: Ability to manage health-related needs will improve Outcome: Progressing   Problem: Education: Goal: Knowledge of General Education information will improve Description: Including pain rating scale, medication(s)/side effects and non-pharmacologic comfort measures Outcome: Progressing   Problem: Activity: Goal: Risk for activity intolerance will decrease Outcome: Progressing   Problem: Nutrition: Goal: Adequate nutrition will be maintained Outcome: Progressing   Problem: Coping: Goal: Level of anxiety will decrease Outcome: Progressing   Problem: Elimination: Goal: Will not experience complications related to bowel motility Outcome: Progressing Goal: Will not experience complications related to urinary retention Outcome: Progressing   Problem: Pain Managment: Goal: General experience of comfort will improve and/or be controlled Outcome: Progressing   Problem: Skin Integrity: Goal: Risk for impaired skin integrity will decrease Outcome: Progressing   Problem: Activity: Goal: Ability to tolerate increased activity will improve Outcome: Progressing   Problem: Respiratory: Goal: Ability to maintain adequate ventilation will improve Outcome: Progressing Goal: Ability to maintain a clear airway will improve Outcome: Progressing   Problem: Fluid Volume: Goal: Ability to maintain a balanced intake and output will improve Outcome: Progressing    Problem: Metabolic: Goal: Ability to maintain appropriate glucose levels will improve Outcome: Progressing   Wendi Dash, RN

## 2024-05-17 NOTE — Discharge Summary (Signed)
 Physician Discharge Summary   Patient: Bruce Fisher MRN: [id number] DOB: February 26, 1961  Admit date:     05/12/2024  Discharge date: 05/17/24  Discharge Physician: Adriana DELENA Grams   PCP: [name], MD   Recommendations at discharge:    Follow-up with your cardiologist as scheduled Follow-up with your allergist/pulmonologist in next 2-6 weeks Continue current medications -subject to change your primary physicians Continue strict diabetic diet - monitor your blood sugars closely (anticipating to be elevated on steroids) - Daily preferably nightly if possible twice daily -nasal/oral wash (nasal with warm water, oral - mouthwash)  Discharge Diagnoses: Principal Problem:   Acute hypoxemic respiratory failure (HCC) Active Problems:   PNA (pneumonia)   Type 2 diabetes mellitus without complication, without long-term current use of insulin  (HCC)   Essential hypertension   Acute hypoxic respiratory failure- Acute bronchitis ?asthma- - Recurrent bronchitis Ground glass opacities on CT chest. -Resolved   - COVID influenza RSV negative.  -  20 pathogen viral panel test all negative Patient quit smoking cigarettes about 35 years ago, and was not a heavy smoker.     Recurrent respiratory issues recently. Seen by pulmonology on 04/15/2024.   Allergy  screen 11/30/2023 >  Eos 0.8 /  IgE 539 > allergy  evalindoor molds, outdoor molds, and cockroach > offered immunotherapy but declined.    Patient received treatment for GERD including Pepcid /PPI.  Also referred to ENT for pansinusitis.   Echo Reviewed 04/05/2024.  EGF preserved, grade 1 diastolic dysfunction, normal LVEF.  No significant valvular abnormalities.   The case was  Dr. Darlean his primary pulmonologist who advised ESR, BNP.  If he does not improve clinically to contact PCCM for further management--- he is improving BNP- 742.0 >>  ESR - 1     Continue DuoNebs, IV steroids, Rocephin  therapy>>> switching to p.o. prednisone  taper,  changing antibiotics to p.o. azithromycin    Continue to wean supplemental oxygen --- currently on 3.5 L, weaning off supplemental oxygen  -Was successfully tapered off supplemental oxygen  currently satting 95% on room air Not O2 dependent at baseline   Diabetes mellitus-controlled.  A1c 03/11/2024 5.9. -Resuming home dose of metformin -500 mg p.o. twice daily, Adding long-acting insulin  (Semglee  10 units nightly, via pen) -Was treated with SSI and long-acting insulin    Hypertension-blood pressure systolic 130s to 839d Resumed olmesartan , triamterene -HCTZ, Benicar . Caution with bradycardia.   Obesity class I- BMI 31.49- Diet, exercise and weight reduction advised.        Consultants: None  Procedures performed: Echo   Disposition: Home Diet recommendation:  Discharge Diet Orders (From admission, onward)     Start     Ordered   05/17/24 0000  Diet - low sodium heart healthy        05/17/24 1215           Cardiac and Carb modified diet DISCHARGE MEDICATION: Allergies as of 05/17/2024       Reactions   Vibramycin  [doxycycline ] Shortness Of Breath   Pt reported increased wheezing with Vibramycin  infusion        Medication List     STOP taking these medications    famotidine  20 MG tablet Commonly known as: Pepcid        TAKE these medications    albuterol  108 (90 Base) MCG/ACT inhaler Commonly known as: VENTOLIN  HFA Inhale 2 puffs into the lungs every 4 (four) hours as needed for wheezing or shortness of breath.   aspirin EC 81 MG tablet Take 81 mg by mouth daily. Swallow whole.  azelastine  0.1 % nasal spray Commonly known as: ASTELIN  Place 2 sprays into both nostrils 2 (two) times daily. Use in each nostril as directed   azithromycin  500 MG tablet Commonly known as: Zithromax  Take 1 tablet (500 mg total) by mouth daily for 5 days.   budesonide -formoterol  160-4.5 MCG/ACT inhaler Commonly known as: Symbicort  Inhale 2 puffs into the lungs 2 times  daily at 12 noon and 4 pm.   Co Q-10 100 MG Caps Take 100 mg by mouth daily.   diltiazem  120 MG 24 hr capsule Commonly known as: CARDIZEM  CD Take 1 capsule (120 mg total) by mouth daily.   furosemide 20 MG tablet Commonly known as: Lasix Take 1 tablet (20 mg total) by mouth daily for 5 days.   insulin  glargine-yfgn 100 UNIT/ML Pen Commonly known as: SEMGLEE  Inject 10 Units into the skin daily. May substitute as needed per insurance.   loratadine  10 MG tablet Commonly known as: CLARITIN  Take 1 tablet by mouth once daily   metFORMIN  500 MG tablet Commonly known as: GLUCOPHAGE  Take 1 tablet (500 mg total) by mouth daily with breakfast.   methylPREDNISolone  4 MG Tbpk tablet Commonly known as: MEDROL  DOSEPAK Medrol  Dosepak take as instructed   multivitamin with minerals Tabs tablet Take 1 tablet by mouth daily.   olmesartan  40 MG tablet Commonly known as: Benicar  Take 1 tablet (40 mg total) by mouth daily. For blood pressure   pantoprazole  40 MG tablet Commonly known as: Protonix  Take 1 tablet (40 mg total) by mouth daily.   rosuvastatin  20 MG tablet Commonly known as: CRESTOR  Take 1 tablet (20 mg total) by mouth daily. for cholesterol.   triamcinolone  55 MCG/ACT Aero nasal inhaler Commonly known as: NASACORT  Place 2 sprays into the nose at bedtime.   triamterene -hydrochlorothiazide  37.5-25 MG tablet Commonly known as: MAXZIDE -25 Take 1 tablet by mouth daily.   VITAMIN D  PO Take 1 tablet by mouth daily.        Discharge Exam: Filed Weights   05/12/24 1219 05/12/24 1628  Weight: 108.9 kg 111.3 kg   General:  AAO x 3,  cooperative, no distress;   HEENT:  Normocephalic, PERRL, otherwise with in Normal limits   Neuro:  CNII-XII intact. , normal motor and sensation, reflexes intact   Lungs:   Clear to auscultation BL, Respirations unlabored,  No wheezes / crackles  Cardio:    S1/S2, RRR, No murmure, No Rubs or Gallops   Abdomen:  Soft, non-tender, bowel  sounds active all four quadrants, no guarding or peritoneal signs.  Muscular  skeletal:  Limited exam -global generalized weaknesses - in bed, able to move all 4 extremities,   2+ pulses,  symmetric, No pitting edema  Skin:  Dry, warm to touch, negative for any Rashes,  Wounds: Please see nursing documentation    Condition at discharge: good  The results of significant diagnostics from this hospitalization (including imaging, microbiology, ancillary and laboratory) are listed below for reference.   Imaging Studies: CT Angio Chest PE W and/or Wo Contrast Result Date: 05/12/2024 CLINICAL DATA:  Concern for pulmonary embolism. EXAM: CT ANGIOGRAPHY CHEST WITH CONTRAST TECHNIQUE: Multidetector CT imaging of the chest was performed using the standard protocol during bolus administration of intravenous contrast. Multiplanar CT image reconstructions and MIPs were obtained to evaluate the vascular anatomy. RADIATION DOSE REDUCTION: This exam was performed according to the departmental dose-optimization program which includes automated exposure control, adjustment of the mA and/or kV according to patient size and/or use of iterative  reconstruction technique. CONTRAST:  75mL OMNIPAQUE IOHEXOL 350 MG/ML SOLN COMPARISON:  CT dated 09/22/2023. FINDINGS: Evaluation of this exam is limited due to respiratory motion. Cardiovascular: There is no cardiomegaly or pericardial effusion. Mild atherosclerotic calcification of the thoracic aorta. No aneurysmal dilatation or dissection. The origins of the great vessels of the aortic arch are patent. Evaluation of the pulmonary arteries is limited due to respiratory motion. No central pulmonary artery embolus identified. Mediastinum/Nodes: No hilar or mediastinal adenopathy. The esophagus and the thyroid gland are grossly unremarkable no mediastinal fluid collection. Lungs/Pleura: Mild bilateral lower lobe bronchiectasis. Areas of ground-glass and reticular densities at the  lung bases bilaterally concerning for atypical infection or aspiration. No consolidative changes. There is no pleural effusion pneumothorax. Aspirated content or mucous secretions noted in the bilateral lower lobe distal bronchi. The central airways are patent. Upper Abdomen: No acute abnormality. Musculoskeletal: Degenerative changes of the spine. No acute osseous pathology. Review of the MIP images confirms the above findings. IMPRESSION: 1. No CT evidence of central pulmonary artery embolus. 2. Areas of ground-glass and reticular densities at the lung bases bilaterally concerning for atypical infection or aspiration. 3.  Aortic Atherosclerosis (ICD10-I70.0). Electronically Signed   By: Vanetta Chou M.D.   On: 05/12/2024 15:15   DG Chest Portable 1 View Result Date: 05/12/2024 CLINICAL DATA:  Shortness of breath. EXAM: PORTABLE CHEST 1 VIEW COMPARISON:  Chest radiograph dated 10/23/2023. FINDINGS: The heart size and mediastinal contours are within normal limits. Both lungs are clear. The visualized skeletal structures are unremarkable. IMPRESSION: No active disease. Electronically Signed   By: Vanetta Chou M.D.   On: 05/12/2024 13:29    Microbiology: Results for orders placed or performed during the hospital encounter of 05/12/24  Resp panel by RT-PCR (RSV, Flu A&B, Covid) Anterior Nasal Swab     Status: None   Collection Time: 05/12/24 12:21 PM   Specimen: Anterior Nasal Swab  Result Value Ref Range Status   SARS Coronavirus 2 by RT PCR NEGATIVE NEGATIVE Final    Comment: (NOTE) SARS-CoV-2 target nucleic acids are NOT DETECTED.  The SARS-CoV-2 RNA is generally detectable in upper respiratory specimens during the acute phase of infection. The lowest concentration of SARS-CoV-2 viral copies this assay can detect is 138 copies/mL. A negative result does not preclude SARS-Cov-2 infection and should not be used as the sole basis for treatment or other patient management decisions. A  negative result may occur with  improper specimen collection/handling, submission of specimen other than nasopharyngeal swab, presence of viral mutation(s) within the areas targeted by this assay, and inadequate number of viral copies(<138 copies/mL). A negative result must be combined with clinical observations, patient history, and epidemiological information. The expected result is Negative.  Fact Sheet for Patients:  BloggerCourse.com  Fact Sheet for Healthcare Providers:  SeriousBroker.it  This test is no t yet approved or cleared by the [place]  FDA and  has been authorized for detection and/or diagnosis of SARS-CoV-2 by FDA under an Emergency Use Authorization (EUA). This EUA will remain  in effect (meaning this test can be used) for the duration of the COVID-19 declaration under Section 564(b)(1) of the Act, 21 U.S.C.section 360bbb-3(b)(1), unless the authorization is terminated  or revoked sooner.       Influenza A by PCR NEGATIVE NEGATIVE Final   Influenza B by PCR NEGATIVE NEGATIVE Final    Comment: (NOTE) The Xpert Xpress SARS-CoV-2/FLU/RSV plus assay is intended as an aid in the diagnosis of influenza from Nasopharyngeal  swab specimens and should not be used as a sole basis for treatment. Nasal washings and aspirates are unacceptable for Xpert Xpress SARS-CoV-2/FLU/RSV testing.  Fact Sheet for Patients: BloggerCourse.com  Fact Sheet for Healthcare Providers: SeriousBroker.it  This test is not yet approved or cleared by the [place]  FDA and has been authorized for detection and/or diagnosis of SARS-CoV-2 by FDA under an Emergency Use Authorization (EUA). This EUA will remain in effect (meaning this test can be used) for the duration of the COVID-19 declaration under Section 564(b)(1) of the Act, 21 U.S.C. section 360bbb-3(b)(1), unless the authorization  is terminated or revoked.     Resp Syncytial Virus by PCR NEGATIVE NEGATIVE Final    Comment: (NOTE) Fact Sheet for Patients: BloggerCourse.com  Fact Sheet for Healthcare Providers: SeriousBroker.it  This test is not yet approved or cleared by the [place]  FDA and has been authorized for detection and/or diagnosis of SARS-CoV-2 by FDA under an Emergency Use Authorization (EUA). This EUA will remain in effect (meaning this test can be used) for the duration of the COVID-19 declaration under Section 564(b)(1) of the Act, 21 U.S.C. section 360bbb-3(b)(1), unless the authorization is terminated or revoked.  Performed at [facility], [address] [place], [place] [address]   Respiratory (~20 pathogens) panel by PCR     Status: None   Collection Time: 05/15/24  1:59 PM   Specimen: Nasopharyngeal Swab; Respiratory  Result Value Ref Range Status   Adenovirus NOT DETECTED NOT DETECTED Final   Coronavirus 229E NOT DETECTED NOT DETECTED Final    Comment: (NOTE) The Coronavirus on the Respiratory Panel, DOES NOT test for the novel  Coronavirus (2019 nCoV)    Coronavirus HKU1 NOT DETECTED NOT DETECTED Final   Coronavirus NL63 NOT DETECTED NOT DETECTED Final   Coronavirus OC43 NOT DETECTED NOT DETECTED Final   Metapneumovirus NOT DETECTED NOT DETECTED Final   Rhinovirus / Enterovirus NOT DETECTED NOT DETECTED Final   Influenza A NOT DETECTED NOT DETECTED Final   Influenza B NOT DETECTED NOT DETECTED Final   Parainfluenza Virus 1 NOT DETECTED NOT DETECTED Final   Parainfluenza Virus 2 NOT DETECTED NOT DETECTED Final   Parainfluenza Virus 3 NOT DETECTED NOT DETECTED Final   Parainfluenza Virus 4 NOT DETECTED NOT DETECTED Final   Respiratory Syncytial Virus NOT DETECTED NOT DETECTED Final   Bordetella pertussis NOT DETECTED NOT DETECTED Final   Bordetella Parapertussis NOT DETECTED NOT DETECTED Final   Chlamydophila pneumoniae NOT  DETECTED NOT DETECTED Final   Mycoplasma pneumoniae NOT DETECTED NOT DETECTED Final    Comment: Performed at [facility], [address], [place], [place] [address]    Labs: CBC: Recent Labs  Lab 05/12/24 1231 05/13/24 0424 05/17/24 0423  WBC 10.5 13.0* 15.9*  NEUTROABS 6.1  --  13.8*  HGB 15.8 15.4 16.0  HCT 48.0 46.7 47.2  MCV 90.2 90.0 87.7  PLT 185 200 196   Basic Metabolic Panel: Recent Labs  Lab 05/12/24 1231 05/13/24 0424 05/17/24 0423  NA 141 140 138  K 3.6 4.0 3.9  CL 104 106 99  CO2 27 24 29   GLUCOSE 135* 161* 260*  BUN 15 18 32*  CREATININE 1.02 0.95 1.25*  CALCIUM  9.1 8.7* 8.6*   Liver Function Tests: Recent Labs  Lab 05/12/24 1231 05/17/24 0423  AST 21 15  ALT 21 26  ALKPHOS 58 64  BILITOT 0.4 0.3  PROT 7.8 6.8  ALBUMIN 4.5 4.1   CBG: Recent Labs  Lab 05/16/24 1201 05/16/24 1616 05/16/24 2337 05/17/24 0746 05/17/24 1150  GLUCAP 328* 352* 281* 241* 315*    Discharge time spent: greater than 50 minutes.  Signed: Adriana DELENA Grams, MD [facility] 05/17/2024

## 2024-05-20 ENCOUNTER — Telehealth: Payer: Self-pay | Admitting: *Deleted

## 2024-05-20 NOTE — Transitions of Care (Post Inpatient/ED Visit) (Signed)
   05/20/2024  Name: [name] MRN: [id number] DOB: [birth year]  Today's TOC FU Call Status: Today's TOC FU Call Status:: Unsuccessful Call (1st Attempt) Unsuccessful Call (1st Attempt) Date: 05/20/24  Attempted to reach the patient regarding the most recent Inpatient/ED visit.  Follow Up Plan: Additional outreach attempts will be made to reach the patient to complete the Transitions of Care (Post Inpatient/ED visit) call.   Mliss Creed [facility], BSN RN Care Manager/ Transition of Care [place]/ [facility] [phone]

## 2024-05-21 ENCOUNTER — Telehealth: Payer: Self-pay | Admitting: *Deleted

## 2024-05-21 NOTE — Transitions of Care (Post Inpatient/ED Visit) (Signed)
   05/21/2024  Name: Dezman Granda MRN: [id number] DOB: 02-05-1961  Today's TOC FU Call Status: Today's TOC FU Call Status:: Unsuccessful Call (2nd Attempt) Unsuccessful Call (2nd Attempt) Date: 05/21/24  Attempted to reach the patient regarding the most recent Inpatient/ED visit.  Follow Up Plan: Additional outreach attempts will be made to reach the patient to complete the Transitions of Care (Post Inpatient/ED visit) call.   Mliss Creed [facility], BSN RN Care Manager/ Transition of Care [place]/ [facility] [phone]

## 2024-05-22 ENCOUNTER — Ambulatory Visit: Payer: Self-pay

## 2024-05-22 ENCOUNTER — Telehealth: Payer: Self-pay | Admitting: *Deleted

## 2024-05-22 ENCOUNTER — Ambulatory Visit: Admitting: Family Medicine

## 2024-05-22 NOTE — Telephone Encounter (Signed)
 Patient/caller refused triage.  Reason for refusal: Only wants an appointment; appt. Scheduled for 10/29.            Copied from CRM [id number]. Topic: Clinical - Red Word Triage >> May 22, 2024  9:56 AM Gustabo D wrote: Sugar been running high in th 300s and 4oos for about 2 or 3 todays Answer Assessment - Initial Assessment Questions 1. BLOOD GLUCOSE: What is your blood glucose level?        2. ONSET: When did you check the blood glucose?       3. USUAL RANGE: What is your glucose level usually? (e.g., usual fasting morning value, usual evening value)   5. TYPE 1 or 2:  Do you know what type of diabetes you have?  (e.g., Type 1, Type 2, Gestational; doesn't know)        6. INSULIN : Do you take insulin ? What type of insulin (s) do you use? What is the mode of delivery? (syringe, pen; injection or pump)?        7. DIABETES PILLS: Do you take any pills for your diabetes? If Yes, ask: Have you missed taking any pills recently?       8. OTHER SYMPTOMS: Do you have any symptoms? (e.g., fever, frequent urination, difficulty breathing, dizziness, weakness, vomiting)  Protocols used: Diabetes - High Blood Sugar-A-AH

## 2024-05-22 NOTE — Telephone Encounter (Signed)
 I called and spoke with patient and got him scheduled to see PCP tomorrow.

## 2024-05-22 NOTE — Transitions of Care (Post Inpatient/ED Visit) (Signed)
   05/22/2024  Name: Bruce Fisher MRN: [id number] DOB: 04/26/1961  Today's TOC FU Call Status: Today's TOC FU Call Status:: Unsuccessful Call (3rd Attempt) Unsuccessful Call (3rd Attempt) Date: 05/22/24  Attempted to reach the patient regarding the most recent Inpatient/ED visit.  Follow Up Plan: No further outreach attempts will be made at this time. We have been unable to contact the patient.  Mliss Creed [facility], BSN RN Care Manager/ Transition of Care [place]/ [facility] [phone]

## 2024-05-23 ENCOUNTER — Encounter: Payer: Self-pay | Admitting: Family Medicine

## 2024-05-23 ENCOUNTER — Ambulatory Visit: Admitting: Family Medicine

## 2024-05-23 VITALS — BP 99/61 | HR 55 | Temp 98.0°F | Ht 74.0 in | Wt 234.0 lb

## 2024-05-23 DIAGNOSIS — E1165 Type 2 diabetes mellitus with hyperglycemia: Secondary | ICD-10-CM | POA: Diagnosis not present

## 2024-05-23 DIAGNOSIS — Z794 Long term (current) use of insulin: Secondary | ICD-10-CM | POA: Diagnosis not present

## 2024-05-23 MED ORDER — INSULIN GLARGINE 100 UNIT/ML SOLOSTAR PEN: 10.0000 [IU] | PEN_INJECTOR | Freq: Every day | SUBCUTANEOUS | 11 refills | Status: DC

## 2024-05-23 MED ORDER — PEN NEEDLES 32G X 4 MM MISC: 11 refills | Status: AC

## 2024-05-23 NOTE — Progress Notes (Signed)
 Subjective:  Patient ID: Bruce Fisher, male    DOB: 1960/12/26  Age: 63 y.o. MRN: [id number]  CC: Blood Sugar Problem (300-400 average. It has gotten to 500./Placed on abx and believes that is why. Insulin  was called in as well but pt was unable to pick it up due to PA being needed and it being called in by the hospital.)   HPI  Discussed the use of AI scribe software for clinical note transcription with the patient, who gave verbal consent to proceed.  History of Present Illness Bruce Fisher is a 62 year old male with diabetes who presents with uncontrolled blood sugar levels.  He has been experiencing high blood sugar levels, with readings over 300 mg/dL, despite being on a step-down medication regimen. He feels weak and dizzy due to the elevated glucose levels. He checks his blood sugar daily, sometimes twice a day, and reports that it remains high regardless of dietary intake. His blood sugar was 317 mg/dL this morning and had been as high as 478 mg/dL earlier in the week. His A1c was 5.9 in August, but his blood sugar levels have been difficult to manage recently.  He was prescribed insulin , specifically a daily shot, but encountered issues with obtaining it due to a prior authorization problem at the pharmacy. He has not been able to start the insulin  regimen as a result.  He completed a course of methylprednisolone  and is currently taking azithromycin  and furosemide. He reports a recent hospitalization due to persistent cough and difficulty breathing, which led to a series of tests. He reports that tests were performed during his hospitalization, and he was told there was excess fluid in his chest. He was treated with a diuretic, which helped alleviate the fluid retention.          05/23/2024    8:16 AM 03/11/2024    8:57 AM 01/29/2024    2:34 PM  Depression screen PHQ 2/9  Decreased Interest 0 0 0  Down, Depressed, Hopeless 0 0 0  PHQ - 2 Score 0 0 0  Altered  sleeping 0    Tired, decreased energy 0    Change in appetite 0    Feeling bad or failure about yourself  0    Trouble concentrating 0    Moving slowly or fidgety/restless 0    Suicidal thoughts 0    PHQ-9 Score 0    Difficult doing work/chores Not difficult at all      History [name] has a past medical history of Diabetes mellitus without complication (HCC), Hyperlipidemia, Hypertension, Kidney stones, and Rectal spasm.   He has a past surgical history that includes Spine surgery.   His family history includes Cancer in his father and sister; Diabetes in his mother; Heart disease in his sister; Heart failure in his mother.He reports that he quit smoking about 30 years ago. His smoking use included cigarettes. He has never used smokeless tobacco. He reports that he does not drink alcohol and does not use drugs.    ROS Review of Systems  Constitutional: Negative.   HENT: Negative.    Eyes:  Negative for visual disturbance.  Respiratory:  Negative for cough and shortness of breath.   Cardiovascular:  Negative for chest pain and leg swelling.  Gastrointestinal:  Negative for diarrhea, nausea and vomiting.  Genitourinary:  Negative for difficulty urinating.  Musculoskeletal:  Negative for arthralgias and myalgias.  Skin:  Negative for rash.  Neurological:  Negative for headaches.  Psychiatric/Behavioral:  Negative for sleep disturbance.     Objective:  BP 99/61   Pulse (!) 55   Temp 98 F (36.7 C)   Ht 6' 2 (1.88 m)   Wt 234 lb (106.1 kg)   SpO2 96%   BMI 30.04 kg/m   BP Readings from Last 3 Encounters:  05/23/24 99/61  05/17/24 (!) 122/90  04/15/24 138/80    Wt Readings from Last 3 Encounters:  05/23/24 234 lb (106.1 kg)  05/12/24 245 lb 4.8 oz (111.3 kg)  04/15/24 252 lb (114.3 kg)     Physical Exam Physical Exam GENERAL: Alert, cooperative, well developed, no acute distress HEENT: Normocephalic, normal oropharynx, moist mucous membranes CHEST: Clear to  auscultation bilaterally, No wheezes, rhonchi, or crackles CARDIOVASCULAR: Normal heart rate and rhythm, S1 and S2 normal without murmurs ABDOMEN: Soft, non-tender, non-distended, without organomegaly, Normal bowel sounds EXTREMITIES: No cyanosis or edema NEUROLOGICAL: Cranial nerves grossly intact, Moves all extremities without gross motor or sensory deficit   Assessment & Plan:  Type 2 diabetes mellitus with hyperglycemia, with long-term current use of insulin  (HCC)  Other orders -     Insulin  Glargine; Inject 10 Units into the skin daily.  Dispense: 15 mL; Refill: 11 -     Pen Needles; Use with solostar daily for insulin  injection  Dispense: 30 each; Refill: 11    Assessment and Plan Assessment & Plan Type 2 diabetes mellitus with hyperglycemia   He experiences persistent hyperglycemia with blood glucose levels over 300 mg/dL, despite an J8r of 5.9. Recent hospitalization for respiratory issues may be linked to fluid overload. Insurance preauthorization issues previously delayed insulin  access. Steroid tapering may contribute to elevated glucose levels. Prescribe Lantus  to [facility] pharmacy, starting at 10 units daily. If blood glucose exceeds 200 mg/dL, increase Lantus  by 5 units daily until levels are below 200 mg/dL. Continue metformin  and complete azithromycin  and furosemide courses. Adjust insulin  if blood glucose drops too low. Schedule a follow-up in two weeks to reassess management.  Heart failure   He was recently hospitalized for respiratory distress, likely due to fluid overload, with no significant lung issues found. Fluid overload is managed with diuretics. A stress test with a cardiologist is scheduled for next month. Continue cardiology follow-up and prescribed furosemide for fluid management.       Follow-up: Return in about 2 weeks (around 06/06/2024).  Butler Der, M.D.

## 2024-05-24 ENCOUNTER — Encounter (HOSPITAL_COMMUNITY): Payer: Self-pay

## 2024-05-24 ENCOUNTER — Other Ambulatory Visit: Payer: Self-pay

## 2024-05-24 DIAGNOSIS — I472 Ventricular tachycardia, unspecified: Secondary | ICD-10-CM

## 2024-05-25 ENCOUNTER — Other Ambulatory Visit: Payer: Self-pay | Admitting: *Deleted

## 2024-05-25 DIAGNOSIS — I10 Essential (primary) hypertension: Secondary | ICD-10-CM

## 2024-05-26 ENCOUNTER — Encounter: Payer: Self-pay | Admitting: Family Medicine

## 2024-05-26 NOTE — Progress Notes (Unsigned)
  Cardiology Office Note:   Date:  05/29/2024  ID:  Bruce Fisher, DOB 07/23/1961, MRN [id number] PCP: [name], MD  [facility] Providers Cardiologist:  Lynwood Schilling, MD {  History of Present Illness:   Bruce Fisher is a 63 y.o. male  who I saw in 2020 for evaluation of PVCs. He had a low risk POET (Plain Old Exercise Treadmill).  EF demonstrated a low normal EF.      He presents for follow up.   He was thought to have COPD flare but has been told by pulmonologist as an outpatient that he does not have COPD.  He does have asthmatic bronchitis apparently.  However, he had problems with tachypalpitations and recently had a monitor.  He was only able to wear this for 7 days because he kept coming off.  He had predominantly sinus rhythm.  He had 1 run of nonsustained VT 4 beats.  He had frequent ventricular ectopy constituting 11.7% of the beats.  There was ventricular bigeminy and trigeminy.  Since I last saw him he had an echocardiogram that was done the other day.  I reviewed this with him and there were no significant findings.  He had a well-preserved ejection fraction.  PET scan was done and returned today.  There were no suggestions of ischemia.  He continues to have bigeminy but does not really notice this.  He is have no presyncope or syncope.  He denies any chest pressure, neck or arm discomfort.  He has had no weight gain or edema.  He was hospitalized just recently with acute shortness of breath.  He was treated for possible pneumonia and bronchitis.  ROS: As stated in the HPI and negative for all other systems.  Studies Reviewed:    EKG:   EKG Interpretation Date/Time:  Wednesday May 29 2024 15:27:31 EST Ventricular Rate:  92 PR Interval:  164 QRS Duration:  94 QT Interval:  358 QTC Calculation: 442 R Axis:   74  Text Interpretation: Sinus rhythm with frequent and consecutive Premature ventricular complexes When compared with ECG of 16-May-2024 00:24,  Vent. rate has decreased BY  52 BPM ST no longer depressed in Anterior leads Confirmed by Schilling Lynwood ([id number]) on 05/29/2024 3:52:04 PM     Risk Assessment/Calculations:              Physical Exam:   VS:  BP 126/68   Pulse 92   Ht 6' 2 (1.88 m)   Wt 240 lb (108.9 kg)   BMI 30.81 kg/m    Wt Readings from Last 3 Encounters:  05/29/24 240 lb (108.9 kg)  05/23/24 234 lb (106.1 kg)  05/12/24 245 lb 4.8 oz (111.3 kg)     GEN: Well nourished, well developed in no acute distress NECK: No JVD; No carotid bruits CARDIAC: RRR, no murmurs, rubs, gallops RESPIRATORY:  Clear to auscultation without rales, wheezing or rhonchi  ABDOMEN: Soft, non-tender, non-distended EXTREMITIES:  No edema; No deformity   ASSESSMENT AND PLAN:   VENTRICULAR  ECTOPY:    He symptomatically is better with diltiazem .  No change in therapy.  Workup has been unremarkable as above.   HTN: His blood pressure is at target.  No change in therapy.     Follow up with me as needed.  Signed, Lynwood Schilling, MD

## 2024-05-27 ENCOUNTER — Institutional Professional Consult (permissible substitution) (INDEPENDENT_AMBULATORY_CARE_PROVIDER_SITE_OTHER): Admitting: Otolaryngology

## 2024-05-28 ENCOUNTER — Ambulatory Visit (HOSPITAL_COMMUNITY)
Admission: RE | Admit: 2024-05-28 | Discharge: 2024-05-28 | Disposition: A | Discharge status: Home / Self Care | Source: Ambulatory Visit | Attending: Cardiology | Admitting: Cardiology

## 2024-05-28 DIAGNOSIS — I493 Ventricular premature depolarization: Secondary | ICD-10-CM | POA: Diagnosis not present

## 2024-05-28 LAB — NM PET CT CARDIAC PERFUSION MULTI W/ABSOLUTE BLOODFLOW
LV dias vol: 152 mL (ref 62–150)
LV sys vol: 86 mL (ref 4.2–5.8)
MBFR: 2.38
Rest MBF: 0.73 ml/g/min
Rest Nuclear Isotope Dose: 27.5 mCi
ST Depression (mm): 0 mm
Stress MBF: 1.74 ml/g/min
Stress Nuclear Isotope Dose: 27.4 mCi

## 2024-05-28 MED ORDER — RUBIDIUM RB82 GENERATOR (RUBYFILL)
27.4000 | PACK | Freq: Once | INTRAVENOUS | Status: AC
  Administered 2024-05-28: 27.4 via INTRAVENOUS

## 2024-05-28 MED ORDER — RUBIDIUM RB82 GENERATOR (RUBYFILL)
27.4500 | PACK | Freq: Once | INTRAVENOUS | Status: AC
  Administered 2024-05-28: 27.45 via INTRAVENOUS

## 2024-05-28 MED ORDER — REGADENOSON 0.4 MG/5ML IV SOLN
INTRAVENOUS | Status: AC
  Filled 2024-05-28: qty 5

## 2024-05-28 MED ORDER — CAFFEINE CITRATE BASE COMPONENT 10 MG/ML IV SOLN
INTRAVENOUS | Status: AC
  Filled 2024-05-28: qty 3

## 2024-05-28 MED ORDER — REGADENOSON 0.4 MG/5ML IV SOLN
0.4000 mg | Freq: Once | INTRAVENOUS | Status: AC
  Administered 2024-05-28: 0.4 mg via INTRAVENOUS

## 2024-05-28 NOTE — Progress Notes (Signed)
 Tolerated Lexiscan stress testwell

## 2024-05-28 NOTE — Progress Notes (Signed)
 Called and spoke with Dr. Delford,  Pt in bigemy and BP 109/73.  Per Delford okay to proceed

## 2024-05-29 ENCOUNTER — Ambulatory Visit (INDEPENDENT_AMBULATORY_CARE_PROVIDER_SITE_OTHER): Admitting: Cardiology

## 2024-05-29 ENCOUNTER — Encounter: Payer: Self-pay | Admitting: Cardiology

## 2024-05-29 VITALS — BP 126/68 | HR 92 | Ht 74.0 in | Wt 240.0 lb

## 2024-05-29 DIAGNOSIS — I10 Essential (primary) hypertension: Secondary | ICD-10-CM | POA: Diagnosis not present

## 2024-05-29 DIAGNOSIS — I493 Ventricular premature depolarization: Secondary | ICD-10-CM | POA: Diagnosis not present

## 2024-05-29 NOTE — Patient Instructions (Addendum)

## 2024-06-05 ENCOUNTER — Encounter (INDEPENDENT_AMBULATORY_CARE_PROVIDER_SITE_OTHER): Payer: Self-pay

## 2024-06-05 ENCOUNTER — Ambulatory Visit (INDEPENDENT_AMBULATORY_CARE_PROVIDER_SITE_OTHER)

## 2024-06-05 VITALS — BP 138/87 | HR 58 | Temp 98.0°F | Wt 235.0 lb

## 2024-06-05 DIAGNOSIS — J3089 Other allergic rhinitis: Secondary | ICD-10-CM | POA: Diagnosis not present

## 2024-06-05 DIAGNOSIS — J329 Chronic sinusitis, unspecified: Secondary | ICD-10-CM

## 2024-06-05 DIAGNOSIS — J3489 Other specified disorders of nose and nasal sinuses: Secondary | ICD-10-CM

## 2024-06-05 NOTE — Progress Notes (Signed)
 HPI:   Discussed the use of AI scribe software for clinical note transcription with the patient, who gave verbal consent to proceed.  History of Present Illness Bruce Fisher is a 63 year old male who presents with chronic sinus drainage and nasal congestion.  He has been hospitalized three times this year, with symptoms improving temporarily after receiving breathing treatments. Symptoms worsen when he stops using his inhaler, leading to a cough and decreased blood oxygen  levels, necessitating hospital visits. During his last hospitalization, he was diagnosed with pneumonia and bronchitis.  He experiences persistent nasal issues, including year-round nasal drainage, sneezing, and congestion, which are particularly severe in the morning. He associates these symptoms with allergies, although they have become constant regardless of the season. He uses Nasacort  as needed, which provides some relief, but does not use it daily. He has also tried a nasal rinse once but found it uncomfortable.  He reports that a CT scan of his cranial area was performed and that the doctor told him it was 'absolutely clogged up.' He experiences nasal drainage down the back of his throat, with mucus varying from heavy to clear. He has a decreased sense of smell and taste.  He takes an unspecified allergy  pill daily, which he feels is ineffective. He has been on antibiotics and steroids during his hospital stays, with the last course ending about 20 days ago.  His wife notes that his nasal issues have significantly impacted his life, causing him to go through tissues rapidly and affecting his daily activities. She mentions that his symptoms have worsened over time, becoming a year-round issue.    PMH/Meds/All/SocHx/FamHx/ROS: Past Medical History:  Diagnosis Date   Diabetes mellitus without complication (HCC)    Hyperlipidemia    Hypertension    Kidney stones    Rectal spasm    Past Surgical History:   Procedure Laterality Date   SPINE SURGERY     lower back   No family history of bleeding disorders, wound healing problems or difficulty with anesthesia.  Social Connections: Not on file    Current Outpatient Medications:    albuterol  (VENTOLIN  HFA) 108 (90 Base) MCG/ACT inhaler, Inhale 2 puffs into the lungs every 4 (four) hours as needed for wheezing or shortness of breath., Disp: 8 g, Rfl: 3   aspirin EC 81 MG tablet, Take 81 mg by mouth daily. Swallow whole., Disp: , Rfl:    azelastine  (ASTELIN ) 0.1 % nasal spray, Place 2 sprays into both nostrils 2 (two) times daily. Use in each nostril as directed, Disp: 30 mL, Rfl: 12   budesonide -formoterol  (SYMBICORT ) 160-4.5 MCG/ACT inhaler, Inhale 2 puffs into the lungs 2 times daily at 12 noon and 4 pm., Disp: 1 each, Rfl: 12   Cholecalciferol  (VITAMIN D  PO), Take 1 tablet by mouth daily., Disp: , Rfl:    Coenzyme Q10 (CO Q-10) 100 MG CAPS, Take 100 mg by mouth daily., Disp: , Rfl:    diltiazem  (CARDIZEM  CD) 120 MG 24 hr capsule, Take 1 capsule (120 mg total) by mouth daily., Disp: 90 capsule, Rfl: 3   insulin  glargine (LANTUS ) 100 UNIT/ML Solostar Pen, Inject 10 Units into the skin daily., Disp: 15 mL, Rfl: 11   Insulin  Pen Needle (PEN NEEDLES) 32G X 4 MM MISC, Use with solostar daily for insulin  injection, Disp: 30 each, Rfl: 11   loratadine  (CLARITIN ) 10 MG tablet, Take 1 tablet by mouth once daily, Disp: 90 tablet, Rfl: 1   metFORMIN  (GLUCOPHAGE ) 500 MG tablet, Take  1 tablet (500 mg total) by mouth daily with breakfast., Disp: 90 tablet, Rfl: 3   Multiple Vitamin (MULTIVITAMIN WITH MINERALS) TABS tablet, Take 1 tablet by mouth daily., Disp: , Rfl:    olmesartan  (BENICAR ) 40 MG tablet, Take 1 tablet (40 mg total) by mouth daily. For blood pressure, Disp: 90 tablet, Rfl: 1   pantoprazole  (PROTONIX ) 40 MG tablet, Take 1 tablet (40 mg total) by mouth daily., Disp: 30 tablet, Rfl: 1   rosuvastatin  (CRESTOR ) 20 MG tablet, Take 1 tablet (20 mg  total) by mouth daily. for cholesterol., Disp: 90 tablet, Rfl: 3   triamcinolone  (NASACORT ) 55 MCG/ACT AERO nasal inhaler, Place 2 sprays into the nose at bedtime., Disp: 16.9 g, Rfl: 11   triamterene -hydrochlorothiazide  (MAXZIDE -25) 37.5-25 MG tablet, Take 1 tablet by mouth once daily, Disp: 90 tablet, Rfl: 0   furosemide (LASIX) 20 MG tablet, Take 1 tablet (20 mg total) by mouth daily for 5 days. (Patient not taking: Reported on 06/05/2024), Disp: 5 tablet, Rfl: 1 A complete ROS was performed with pertinent positives/negatives noted in the HPI. The remainder of the ROS are negative.   Physical Exam:  BP 138/87 (BP Location: Right Arm, Patient Position: Sitting, Cuff Size: Normal)   Pulse (!) 58   Temp 98 F (36.7 C)   Wt 235 lb (106.6 kg)   SpO2 96%   BMI 30.17 kg/m  General: Well developed, well nourished. No acute distress.  Head/Face: Normocephalic. No sinus tenderness. Facial nerve intact and equal bilaterally. No facial lacerations. Eyes: PERRL, no scleral icterus or conjunctival hemorrhage. EOMI. Ears: No gross deformity. Normal external canal. Tympanic membrane in tact bilaterally Hearing: Normal speech reception.  Nose: No gross deformity or lesions. No purulent discharge. Bilateral inferior turbinate hypertrophy. Mucoid drainage bilaterally Mouth/Oropharynx: Lips without any lesions. No mucosal lesions within the oropharynx. No tonsillar enlargement, exudate, or lesions. Pharyngeal walls symmetrical. Uvula midline. Tongue midline without lesions. Larynx: See TFL if applicable Nasopharynx: See TFL if applicable Neck: Trachea midline. No masses. No thyromegaly or nodules palpated. No crepitus. Lymphatic: No lymphadenopathy in the neck. Respiratory: No stridor or distress. Room air. Cardiovascular: Regular rate and rhythm. Extremities: No edema or cyanosis. Warm and well-perfused. Skin: No scars or lesions on face or neck. Neurologic: CN II-XII grossly intact. Moving all  extremities without gross abnormality. Other:  Independent Review of Additional Tests or Records: None Procedures: Bilateral Diagnostic Nasal Endoscopy  Pre-procedure diagnosis: Concern for sinusitis  Post-procedure diagnosis: same Indication: See pre-procedure diagnosis and physical exam above Complications: None apparent Anesthesia: Lidocaine 4% and topical decongestant was topically sprayed in each nasal cavity  Description of Procedure:  Patient was identified. A flexible endoscope was utilized to evaluate the sinonasal cavities, mucosa, sinus ostia and turbinates and septum.  Overall, signs of mucosal inflammation are noted.  Also noted is significant mucoid drainage bilaterally.  No mucopurulence, polyps, or masses noted.   Right Middle meatus: mucoid drainage, no polyps or purulence Right SE Recess: mucoid drainage, no polyps or purulence Left MM: mucoid drainage, no polyps or purulence Left SE Recess: mucoid drainage, no polyps or purulence  CPT CODE -- 68768 - Mod 25  Impression & Plans: Assessment & Plan Chronic allergic rhinitis with nasal obstruction and rhinorrhea - Start Nasacort  nasal spray, two sprays in each nostril every morning. - Start azelastine  nasal spray, twice daily - Recommended daily use of NeilMed sinus rinse to clear nasal passages. To be used prior to use of nasal sprays - Ordered CT scan  of sinuses four weeks after starting nasal sprays to assess treatment efficacy  Hyposmia and hypogeusia secondary to nasal inflammation - Address nasal inflammation with prescribed nasal sprays to improve olfactory function.   Follow-up in 4-6 weeks to discuss CT results and treatment efficacy.  Adah Malkin, DO [place] [facility]

## 2024-06-06 ENCOUNTER — Telehealth (INDEPENDENT_AMBULATORY_CARE_PROVIDER_SITE_OTHER): Payer: Self-pay

## 2024-06-06 NOTE — Telephone Encounter (Signed)
 Patient called stating that the Nasacort  nasal spray and the azelastine  nasal spray was not sent over. I went back and checked your notes I seen where you told the patient to take them but not where the prescription was sent over I let the patient know I would get this figured out and give him a call back.

## 2024-06-07 ENCOUNTER — Telehealth (INDEPENDENT_AMBULATORY_CARE_PROVIDER_SITE_OTHER): Payer: Self-pay

## 2024-06-07 ENCOUNTER — Other Ambulatory Visit (INDEPENDENT_AMBULATORY_CARE_PROVIDER_SITE_OTHER): Payer: Self-pay

## 2024-06-07 DIAGNOSIS — J3089 Other allergic rhinitis: Secondary | ICD-10-CM

## 2024-06-07 DIAGNOSIS — J329 Chronic sinusitis, unspecified: Secondary | ICD-10-CM

## 2024-06-07 MED ORDER — AZELASTINE HCL 0.1 % NA SOLN
2.0000 | Freq: Two times a day (BID) | NASAL | 12 refills | Status: AC
Start: 2024-06-07 — End: ?

## 2024-06-07 MED ORDER — TRIAMCINOLONE ACETONIDE 55 MCG/ACT NA AERO
2.0000 | INHALATION_SPRAY | Freq: Every day | NASAL | 12 refills | Status: AC
Start: 2024-06-07 — End: ?

## 2024-06-07 NOTE — Telephone Encounter (Signed)
 I will call him and let him know.

## 2024-06-07 NOTE — Telephone Encounter (Signed)
 Patient called and stated that he is still waiting for the Rxs Dr. Mila was going to send in 3 days ago when he was here for his appt.  Patient stated the Rxs  should be sent to [facility], [address], [place], [place], [phone].  Please call him at [phone].

## 2024-06-10 ENCOUNTER — Telehealth (INDEPENDENT_AMBULATORY_CARE_PROVIDER_SITE_OTHER): Payer: Self-pay

## 2024-06-10 NOTE — Telephone Encounter (Signed)
 I spoke w/Sabrina at the pt's pharmacy. She stated that the Azelastine  has been filled, however the Nasacort  is now OTC so insurance will not cover. I spoke w/pt to inform of this. Pt understood.

## 2024-06-11 ENCOUNTER — Ambulatory Visit (INDEPENDENT_AMBULATORY_CARE_PROVIDER_SITE_OTHER): Payer: Self-pay | Admitting: Family Medicine

## 2024-06-11 ENCOUNTER — Encounter: Payer: Self-pay | Admitting: Family Medicine

## 2024-06-11 ENCOUNTER — Ambulatory Visit: Payer: Self-pay | Admitting: Family Medicine

## 2024-06-11 VITALS — BP 130/75 | HR 60 | Temp 98.2°F | Ht 74.0 in | Wt 252.8 lb

## 2024-06-11 DIAGNOSIS — G4762 Sleep related leg cramps: Secondary | ICD-10-CM | POA: Diagnosis not present

## 2024-06-11 DIAGNOSIS — J329 Chronic sinusitis, unspecified: Secondary | ICD-10-CM

## 2024-06-11 DIAGNOSIS — E782 Mixed hyperlipidemia: Secondary | ICD-10-CM | POA: Diagnosis not present

## 2024-06-11 DIAGNOSIS — I499 Cardiac arrhythmia, unspecified: Secondary | ICD-10-CM

## 2024-06-11 DIAGNOSIS — I10 Essential (primary) hypertension: Secondary | ICD-10-CM | POA: Diagnosis not present

## 2024-06-11 DIAGNOSIS — E1165 Type 2 diabetes mellitus with hyperglycemia: Secondary | ICD-10-CM | POA: Diagnosis not present

## 2024-06-11 DIAGNOSIS — Z794 Long term (current) use of insulin: Secondary | ICD-10-CM

## 2024-06-11 LAB — LIPID PANEL

## 2024-06-11 LAB — BAYER DCA HB A1C WAIVED: HB A1C (BAYER DCA - WAIVED): 7.8 % — ABNORMAL HIGH (ref 4.8–5.6)

## 2024-06-11 MED ORDER — TRIAMTERENE-HCTZ 37.5-25 MG PO TABS: 1.0000 | ORAL_TABLET | Freq: Every day | ORAL | 0 refills | Status: AC

## 2024-06-11 MED ORDER — LORATADINE 10 MG PO TABS: 10.0000 mg | ORAL_TABLET | Freq: Every day | ORAL | 1 refills | Status: AC

## 2024-06-11 MED ORDER — METFORMIN HCL 500 MG PO TABS: 500.0000 mg | ORAL_TABLET | Freq: Two times a day (BID) | ORAL | 3 refills | Status: AC

## 2024-06-11 MED ORDER — PANTOPRAZOLE SODIUM 40 MG PO TBEC: 40.0000 mg | DELAYED_RELEASE_TABLET | Freq: Every day | ORAL | 1 refills | Status: DC

## 2024-06-11 NOTE — Progress Notes (Signed)
 Subjective:  Patient ID: Bruce Fisher, male    DOB: 07-22-1961  Age: 63 y.o. MRN: [id number]  CC: Medical Management of Chronic Issues   HPI  Discussed the use of AI scribe software for clinical note transcription with the patient, who gave verbal consent to proceed.  History of Present Illness Bruce Fisher is a 63 year old male with diabetes who presents for medication management and evaluation of leg cramps.  He is experiencing difficulty managing blood sugar levels with his current insulin  regimen. He has been taking 35 units of insulin , which he describes as a 'goodly amount,' but finds it does not manage his blood sugar well throughout the day. Previously, he managed his diabetes with metformin , taking two pills in the morning and occasionally one at night, which he found effective. He is currently taking pantoprazole , metformin , and triamterene  hydrochlorothiazide .  He has been experiencing cramps in his hands, feet, and legs for the past two to three weeks, primarily at night. He has started eating a banana daily, which has helped reduce the frequency of cramps. He also takes two tablespoons of apple cider vinegar in the morning, which he waters down. He is concerned that his blood work might reveal low potassium levels contributing to the cramps.  He uses albuterol  as a rescue inhaler, prescribed with three refills, and has been using it two to three times a day. He also uses nasal sprays, including azelastine , as maintenance medication. He feels congested but notes improvement since starting these treatments.  He mentions having his eyes checked recently, with no diabetic changes observed.          06/11/2024    9:22 AM 05/23/2024    8:16 AM 03/11/2024    8:57 AM  Depression screen PHQ 2/9  Decreased Interest 0 0 0  Down, Depressed, Hopeless 0 0 0  PHQ - 2 Score 0 0 0  Altered sleeping  0   Tired, decreased energy  0   Change in appetite  0   Feeling bad or  failure about yourself   0   Trouble concentrating  0   Moving slowly or fidgety/restless  0   Suicidal thoughts  0   PHQ-9 Score  0    Difficult doing work/chores Not difficult at all Not difficult at all      Data saved with a previous flowsheet row definition    History Bruce Fisher has a past medical history of Diabetes mellitus without complication (HCC), Hyperlipidemia, Hypertension, Kidney stones, and Rectal spasm.   He has a past surgical history that includes Spine surgery.   His family history includes Cancer in his father and sister; Diabetes in his mother; Heart disease in his sister; Heart failure in his mother.He reports that he quit smoking about 30 years ago. His smoking use included cigarettes. He has never used smokeless tobacco. He reports that he does not drink alcohol and does not use drugs.    ROS Review of Systems  Constitutional: Negative.   HENT: Negative.    Eyes:  Negative for visual disturbance.  Respiratory:  Negative for cough and shortness of breath.   Cardiovascular:  Negative for chest pain and leg swelling.  Gastrointestinal:  Negative for abdominal pain, diarrhea, nausea and vomiting.  Genitourinary:  Negative for difficulty urinating.  Musculoskeletal:  Positive for myalgias (cramps in legs, primarily feet, mostly at night). Negative for arthralgias.  Skin:  Negative for rash.  Neurological:  Negative for headaches.  Psychiatric/Behavioral:  Negative  for sleep disturbance.     Objective:  BP 130/75   Pulse 60   Temp 98.2 F (36.8 C)   Ht 6' 2 (1.88 m)   Wt 252 lb 12.8 oz (114.7 kg)   SpO2 97%   BMI 32.46 kg/m   BP Readings from Last 3 Encounters:  06/11/24 130/75  06/05/24 138/87  05/29/24 126/68    Wt Readings from Last 3 Encounters:  06/11/24 252 lb 12.8 oz (114.7 kg)  06/05/24 235 lb (106.6 kg)  05/29/24 240 lb (108.9 kg)     Physical Exam Physical Exam GENERAL: Alert, cooperative, well developed, no acute distress. HEENT:  Normocephalic, normal oropharynx, moist mucous membranes. CHEST: Lungs congested, clear to auscultation bilaterally, no wheezes, rhonchi, or crackles. CARDIOVASCULAR: Heart with skipped beats, S1 and S2 normal without murmurs. ABDOMEN: Soft, non-tender, non-distended, without organomegaly, normal bowel sounds. EXTREMITIES: No cyanosis or edema. NEUROLOGICAL: Cranial nerves grossly intact, moves all extremities without gross motor or sensory deficit.   Assessment & Plan:  Type 2 diabetes mellitus with hyperglycemia, with long-term current use of insulin  (HCC) -     Bayer DCA Hb A1c Waived -     Lipid panel  Mixed hyperlipidemia -     Lipid panel  Essential hypertension -     Comprehensive metabolic panel with GFR -     CBC with Differential/Platelet  Irregular heart rhythm -     Comprehensive metabolic panel with GFR -     CBC with Differential/Platelet  Chronic congestion of paranasal sinus -     Loratadine ; Take 1 tablet (10 mg total) by mouth daily.  Dispense: 90 tablet; Refill: 1  Essential (primary) hypertension -     Triamterene -HCTZ; Take 1 tablet by mouth daily.  Dispense: 90 tablet; Refill: 0  Nocturnal leg cramps  Other orders -     metFORMIN  HCl; Take 1 tablet (500 mg total) by mouth 2 (two) times daily with a meal.  Dispense: 180 tablet; Refill: 3 -     Pantoprazole  Sodium; Take 1 tablet (40 mg total) by mouth daily.  Dispense: 30 tablet; Refill: 1    Assessment and Plan Assessment & Plan Type 2 diabetes mellitus   He is currently managed with insulin  (Lantus ) and metformin  but reports dissatisfaction with insulin , especially regarding morning blood glucose levels. Metformin  previously managed his blood glucose effectively, so he is transitioning back to metformin  with plans to discontinue insulin . The potential for medications to alter glucose management post-prednisone  use was discussed. Lantus  was discontinued without tapering, and metformin  was initiated twice  daily. Blood glucose levels will be monitored and documented. He should return for follow-up if metformin  does not control blood glucose effectively.  Essential hypertension   His blood pressure is well-controlled.  Mixed hyperlipidemia   There was no specific discussion regarding hyperlipidemia management in this encounter.  Cardiac arrhythmia   An irregular heartbeat was noted, but no acute intervention is required.  Sleep related leg cramps   He has been experiencing leg cramps, particularly at night, for the past two to three weeks. Improvement has been noted with increased potassium intake from bananas. The differential includes electrolyte imbalance, particularly potassium deficiency. Blood tests were ordered to check potassium, calcium , chloride, and magnesium  levels. He was advised to consume half a banana twice daily to manage potassium intake. Potassium supplementation will be considered if levels are low.       Follow-up: Return in about 3 months (around 09/11/2024).  Butler Der, M.D.

## 2024-06-12 LAB — COMPREHENSIVE METABOLIC PANEL WITH GFR
ALT: 27 IU/L (ref 0–44)
AST: 24 IU/L (ref 0–40)
Albumin: 4 g/dL (ref 3.9–4.9)
Alkaline Phosphatase: 55 IU/L (ref 47–123)
BUN/Creatinine Ratio: 17 (ref 10–24)
BUN: 15 mg/dL (ref 8–27)
Bilirubin Total: 0.3 mg/dL (ref 0.0–1.2)
CO2: 26 mmol/L (ref 20–29)
Calcium: 9 mg/dL (ref 8.6–10.2)
Chloride: 106 mmol/L (ref 96–106)
Creatinine, Ser: 0.88 mg/dL (ref 0.76–1.27)
Globulin, Total: 2.3 g/dL (ref 1.5–4.5)
Glucose: 113 mg/dL — AB (ref 70–99)
Potassium: 4.3 mmol/L (ref 3.5–5.2)
Sodium: 144 mmol/L (ref 134–144)
Total Protein: 6.3 g/dL (ref 6.0–8.5)
eGFR: 98 mL/min/1.73 (ref >59)

## 2024-06-12 LAB — CBC WITH DIFFERENTIAL/PLATELET
Basophils Absolute: 0 x10E3/uL (ref 0.0–0.2)
Basos: 1 %
EOS (ABSOLUTE): 0.5 x10E3/uL — ABNORMAL HIGH (ref 0.0–0.4)
Eos: 8 %
Hematocrit: 42.2 % (ref 37.5–51.0)
Hemoglobin: 13.5 g/dL (ref 13.0–17.7)
Immature Grans (Abs): 0 x10E3/uL (ref 0.0–0.1)
Immature Granulocytes: 0 %
Lymphocytes Absolute: 2.1 x10E3/uL (ref 0.7–3.1)
Lymphs: 36 %
MCH: 29.3 pg (ref 26.6–33.0)
MCHC: 32 g/dL (ref 31.5–35.7)
MCV: 92 fL (ref 79–97)
Monocytes Absolute: 0.7 x10E3/uL (ref 0.1–0.9)
Monocytes: 12 %
Neutrophils Absolute: 2.6 x10E3/uL (ref 1.4–7.0)
Neutrophils: 43 %
Platelets: 162 x10E3/uL (ref 150–450)
RBC: 4.6 x10E6/uL (ref 4.14–5.80)
RDW: 14.1 % (ref 11.6–15.4)
WBC: 5.8 x10E3/uL (ref 3.4–10.8)

## 2024-06-12 LAB — LIPID PANEL
Cholesterol, Total: 150 mg/dL (ref 100–199)
HDL: 48 mg/dL (ref >39)
LDL CALC COMMENT:: 3.1 ratio (ref 0.0–5.0)
LDL Chol Calc (NIH): 73 mg/dL (ref 0–99)
Triglycerides: 171 mg/dL — AB (ref 0–149)
VLDL Cholesterol Cal: 29 mg/dL (ref 5–40)

## 2024-06-12 NOTE — Progress Notes (Signed)
Hello Bruce Fisher,  Your lab result is normal and/or stable.Some minor variations that are not significant are commonly marked abnormal, but do not represent any medical problem for you.  Best regards, Mechele Claude, M.D.

## 2024-06-13 ENCOUNTER — Ambulatory Visit: Admitting: Family Medicine

## 2024-07-12 ENCOUNTER — Institutional Professional Consult (permissible substitution) (INDEPENDENT_AMBULATORY_CARE_PROVIDER_SITE_OTHER)

## 2024-07-12 ENCOUNTER — Encounter (INDEPENDENT_AMBULATORY_CARE_PROVIDER_SITE_OTHER): Payer: Self-pay

## 2024-08-08 ENCOUNTER — Other Ambulatory Visit: Payer: Self-pay | Admitting: Family Medicine

## 2024-08-21 ENCOUNTER — Ambulatory Visit: Admitting: Allergy & Immunology

## 2024-09-11 ENCOUNTER — Ambulatory Visit: Admitting: Family Medicine
